# Patient Record
Sex: Female | Born: 1935
Health system: Southern US, Community
[De-identification: ages and names within clinical notes are randomized; demographics above are authoritative.]

## PROBLEM LIST (undated history)

## (undated) DIAGNOSIS — R55 Syncope and collapse: Secondary | ICD-10-CM

## (undated) DIAGNOSIS — T7840XA Allergy, unspecified, initial encounter: Secondary | ICD-10-CM

## (undated) DIAGNOSIS — Z8673 Personal history of transient ischemic attack (TIA), and cerebral infarction without residual deficits: Secondary | ICD-10-CM

## (undated) DIAGNOSIS — C50919 Malignant neoplasm of unspecified site of unspecified female breast: Secondary | ICD-10-CM

## (undated) DIAGNOSIS — I10 Essential (primary) hypertension: Secondary | ICD-10-CM

## (undated) DIAGNOSIS — E0590 Thyrotoxicosis, unspecified without thyrotoxic crisis or storm: Secondary | ICD-10-CM

## (undated) DIAGNOSIS — D649 Anemia, unspecified: Secondary | ICD-10-CM

## (undated) DIAGNOSIS — F419 Anxiety disorder, unspecified: Secondary | ICD-10-CM

## (undated) DIAGNOSIS — E039 Hypothyroidism, unspecified: Secondary | ICD-10-CM

## (undated) HISTORY — DX: Anxiety disorder, unspecified: F41.9

## (undated) HISTORY — DX: Malignant neoplasm of unspecified site of unspecified female breast: C50.919

## (undated) HISTORY — DX: Syncope and collapse: R55

## (undated) HISTORY — DX: Thyrotoxicosis, unspecified without thyrotoxic crisis or storm: E05.90

## (undated) HISTORY — DX: Essential (primary) hypertension: I10

## (undated) HISTORY — DX: Anemia, unspecified: D64.9

## (undated) HISTORY — DX: Hypothyroidism, unspecified: E03.9

## (undated) HISTORY — PX: COLON SURGERY: SHX602

## (undated) HISTORY — DX: Allergy, unspecified, initial encounter: T78.40XA

## (undated) HISTORY — PX: TONSILLECTOMY: SUR1361

## (undated) HISTORY — PX: CHOLECYSTECTOMY: SHX55

## (undated) HISTORY — DX: Personal history of transient ischemic attack (TIA), and cerebral infarction without residual deficits: Z86.73

---

## 1996-09-01 HISTORY — PX: BREAST LUMPECTOMY: SHX2

## 1997-12-04 ENCOUNTER — Ambulatory Visit (HOSPITAL_COMMUNITY): Admission: RE | Admit: 1997-12-04 | Discharge: 1997-12-04 | Payer: Self-pay | Admitting: General Surgery

## 1998-06-12 ENCOUNTER — Ambulatory Visit (HOSPITAL_COMMUNITY): Admission: RE | Admit: 1998-06-12 | Discharge: 1998-06-12 | Payer: Self-pay | Admitting: General Surgery

## 1998-12-06 ENCOUNTER — Ambulatory Visit (HOSPITAL_COMMUNITY): Admission: RE | Admit: 1998-12-06 | Discharge: 1998-12-06 | Payer: Self-pay | Admitting: General Surgery

## 1998-12-06 ENCOUNTER — Encounter: Payer: Self-pay | Admitting: General Surgery

## 1999-04-17 ENCOUNTER — Other Ambulatory Visit: Admission: RE | Admit: 1999-04-17 | Discharge: 1999-04-17 | Payer: Self-pay | Admitting: Internal Medicine

## 1999-05-07 ENCOUNTER — Ambulatory Visit (HOSPITAL_COMMUNITY): Admission: RE | Admit: 1999-05-07 | Discharge: 1999-05-07 | Payer: Self-pay | Admitting: Obstetrics and Gynecology

## 1999-05-07 ENCOUNTER — Encounter (INDEPENDENT_AMBULATORY_CARE_PROVIDER_SITE_OTHER): Payer: Self-pay | Admitting: Specialist

## 1999-12-18 ENCOUNTER — Encounter: Admission: RE | Admit: 1999-12-18 | Discharge: 1999-12-18 | Payer: Self-pay | Admitting: General Surgery

## 1999-12-18 ENCOUNTER — Encounter: Payer: Self-pay | Admitting: General Surgery

## 2000-10-09 ENCOUNTER — Other Ambulatory Visit: Admission: RE | Admit: 2000-10-09 | Discharge: 2000-10-09 | Payer: Self-pay | Admitting: Obstetrics and Gynecology

## 2000-12-21 ENCOUNTER — Encounter: Admission: RE | Admit: 2000-12-21 | Discharge: 2000-12-21 | Payer: Self-pay | Admitting: General Surgery

## 2000-12-21 ENCOUNTER — Encounter: Payer: Self-pay | Admitting: General Surgery

## 2001-08-18 ENCOUNTER — Ambulatory Visit (HOSPITAL_COMMUNITY): Admission: RE | Admit: 2001-08-18 | Discharge: 2001-08-18 | Payer: Self-pay | Admitting: Gastroenterology

## 2001-12-22 ENCOUNTER — Encounter: Payer: Self-pay | Admitting: General Surgery

## 2001-12-22 ENCOUNTER — Encounter: Admission: RE | Admit: 2001-12-22 | Discharge: 2001-12-22 | Payer: Self-pay | Admitting: General Surgery

## 2002-05-31 ENCOUNTER — Encounter (HOSPITAL_COMMUNITY): Admission: RE | Admit: 2002-05-31 | Discharge: 2002-08-29 | Payer: Self-pay | Admitting: Endocrinology

## 2002-06-01 ENCOUNTER — Encounter: Payer: Self-pay | Admitting: Endocrinology

## 2002-06-02 ENCOUNTER — Encounter: Payer: Self-pay | Admitting: Endocrinology

## 2002-06-08 ENCOUNTER — Encounter: Payer: Self-pay | Admitting: Endocrinology

## 2003-01-02 ENCOUNTER — Encounter: Admission: RE | Admit: 2003-01-02 | Discharge: 2003-01-02 | Payer: Self-pay | Admitting: Cardiology

## 2003-01-02 ENCOUNTER — Encounter: Payer: Self-pay | Admitting: Cardiology

## 2003-05-18 ENCOUNTER — Other Ambulatory Visit: Admission: RE | Admit: 2003-05-18 | Discharge: 2003-05-18 | Payer: Self-pay | Admitting: Obstetrics and Gynecology

## 2004-02-28 ENCOUNTER — Encounter: Admission: RE | Admit: 2004-02-28 | Discharge: 2004-02-28 | Payer: Self-pay | Admitting: Cardiology

## 2005-01-31 ENCOUNTER — Ambulatory Visit (HOSPITAL_COMMUNITY): Admission: RE | Admit: 2005-01-31 | Discharge: 2005-01-31 | Payer: Self-pay | Admitting: Orthopedic Surgery

## 2005-04-28 ENCOUNTER — Encounter: Admission: RE | Admit: 2005-04-28 | Discharge: 2005-04-28 | Payer: Self-pay | Admitting: Cardiology

## 2005-10-15 HISTORY — PX: KNEE ARTHROSCOPY: SUR90

## 2005-12-31 ENCOUNTER — Encounter: Payer: Self-pay | Admitting: General Surgery

## 2006-05-06 ENCOUNTER — Encounter: Admission: RE | Admit: 2006-05-06 | Discharge: 2006-05-06 | Payer: Self-pay | Admitting: Cardiology

## 2007-05-28 ENCOUNTER — Encounter: Admission: RE | Admit: 2007-05-28 | Discharge: 2007-05-28 | Payer: Self-pay | Admitting: Cardiology

## 2008-05-31 ENCOUNTER — Encounter: Admission: RE | Admit: 2008-05-31 | Discharge: 2008-05-31 | Payer: Self-pay | Admitting: Cardiology

## 2009-06-29 ENCOUNTER — Encounter: Admission: RE | Admit: 2009-06-29 | Discharge: 2009-06-29 | Payer: Self-pay | Admitting: Cardiology

## 2010-06-18 ENCOUNTER — Ambulatory Visit: Payer: Self-pay | Admitting: Cardiology

## 2010-07-30 ENCOUNTER — Encounter: Admission: RE | Admit: 2010-07-30 | Discharge: 2010-07-30 | Payer: Self-pay | Admitting: Cardiology

## 2010-12-03 ENCOUNTER — Encounter: Payer: Self-pay | Admitting: Cardiology

## 2010-12-04 ENCOUNTER — Encounter: Payer: Self-pay | Admitting: Cardiology

## 2010-12-04 ENCOUNTER — Ambulatory Visit (INDEPENDENT_AMBULATORY_CARE_PROVIDER_SITE_OTHER): Payer: Medicare Other | Admitting: Cardiology

## 2010-12-04 DIAGNOSIS — I119 Hypertensive heart disease without heart failure: Secondary | ICD-10-CM

## 2010-12-04 DIAGNOSIS — F32A Depression, unspecified: Secondary | ICD-10-CM

## 2010-12-04 DIAGNOSIS — Z853 Personal history of malignant neoplasm of breast: Secondary | ICD-10-CM

## 2010-12-04 DIAGNOSIS — E7800 Pure hypercholesterolemia, unspecified: Secondary | ICD-10-CM

## 2010-12-04 DIAGNOSIS — E039 Hypothyroidism, unspecified: Secondary | ICD-10-CM

## 2010-12-04 DIAGNOSIS — F329 Major depressive disorder, single episode, unspecified: Secondary | ICD-10-CM | POA: Insufficient documentation

## 2010-12-04 HISTORY — DX: Depression, unspecified: F32.A

## 2010-12-04 HISTORY — DX: Hypertensive heart disease without heart failure: I11.9

## 2010-12-04 NOTE — Progress Notes (Signed)
HPI: This pleasant 77 year old woman is seen for a scheduled six-month followup office visit she has a history of essential hypertension.  She's also had a past history of breast cancer and a history of hypothyroidism following treatment for hyperthyroidism.  She also has a history of hypercholesterolemia.  Since last visit she's been doing well with no new cardiac symptoms.  She's had no dizziness or syncope.  She's had no chest pain or shortness of breath or palpitations.  Current Outpatient Prescriptions  Medication Sig Dispense Refill  . aspirin 81 MG tablet Take 81 mg by mouth daily.        Marland Kitchen atenolol (TENORMIN) 50 MG tablet Take 50 mg by mouth daily.        Marland Kitchen atorvastatin (LIPITOR) 20 MG tablet Take 20 mg by mouth daily.        . Cholecalciferol (VITAMIN D PO) Take by mouth daily.        . Escitalopram Oxalate (LEXAPRO PO) Take by mouth as needed.        . Ferrous Sulfate (SLOW FE PO) Take by mouth daily.        . hydrochlorothiazide 25 MG tablet Take 12.5 mg by mouth daily.        Marland Kitchen levothyroxine (SYNTHROID, LEVOTHROID) 75 MCG tablet Take 88 mcg by mouth.         Allergies  Allergen Reactions  . Penicillins     Patient Active Problem List  Diagnoses  . Benign hypertensive heart disease without heart failure  . Hypothyroidism  . Hypercholesterolemia  . History of breast cancer  . Depression    History  Smoking status  . Never Smoker   Smokeless tobacco  . Not on file    History  Alcohol Use     No family history on file.  Review of Systems: The patient denies any heat or cold intolerance.  No weight gain or weight loss.  The patient denies headaches or blurry vision.  There is no cough or sputum production.  The patient denies dizziness.  There is no hematuria or hematochezia.  The patient denies any muscle aches or arthritis.  The patient denies any rash.  The patient denies frequent falling or instability.  There is no history of depression or anxiety.  All other  systems were reviewed and are negative.   Physical Exam: Filed Vitals:   12/04/10 1041  BP: 130/78  Pulse: 68   Weight is 151, down 10 pounds.Pupils equal and reactive.   Extraocular Movements are full.  There is no scleral icterus.  The mouth and pharynx are normal.  The neck is supple.  The carotids reveal no bruits.  The jugular venous pressure is normal.  The thyroid is not enlarged.  There is no lymphadenopathy.The chest is clear to percussion and auscultation. There are no rales or rhonchi. Expansion of the chest is symmetrical.  The breasts reveal no masses.The precordium is quiet.  The first heart sound is normal.  The second heart sound is physiologically split.  There is no murmur gallop rub or click.  There is no abnormal lift or heave.The abdomen is soft and nontender. Bowel sounds are normal. The liver and spleen are not enlarged. There Are no abdominal masses. There are no bruits.The pedal pulses are good.  There is no phlebitis or edema.  There is no cyanosis or clubbing.Strength is normal and symmetrical in all extremities.  There is no lateralizing weakness.  There are no sensory deficits.   Assessment /  Plan: Recheck in 6 months.  EKG and.  Continue same medication.

## 2010-12-04 NOTE — Assessment & Plan Note (Signed)
No awareness of any new lumps in her breast.  Her last mammogram was 07/30/10 and was normal at that time.  Her previous breast cancer was in 1998 in the left breast and Dr. Johna Sheriff is her surgeon

## 2010-12-04 NOTE — Assessment & Plan Note (Signed)
The patient has Lexapro on he had which she uses on a p.r.n.basis.Marland Kitchen

## 2010-12-04 NOTE — Assessment & Plan Note (Signed)
The patient has not been experiencing any symptoms referable to her blood pressure.  She remains on hydrochlorothiazide and atenolol.  She's not having any side effects from her medicines.  She denies chest pain or shortness of breath

## 2010-12-04 NOTE — Assessment & Plan Note (Signed)
Her lipids are followed closely by Dr. Leslie Dales and have remained stable

## 2010-12-20 ENCOUNTER — Other Ambulatory Visit: Payer: Self-pay | Admitting: Cardiology

## 2010-12-20 DIAGNOSIS — E7800 Pure hypercholesterolemia, unspecified: Secondary | ICD-10-CM

## 2010-12-20 NOTE — Telephone Encounter (Signed)
escribe request  

## 2011-01-17 NOTE — Op Note (Signed)
[facility]  Patient:    Vanessa Clayton, Vanessa Clayton Visit Number: [id number] MRN: [id number]          Service Type: END Location: ENDO Attending Physician:  Rich Brave Dictated by:   Florencia Reasons, M.D. Proc. Date: 08/18/01 Admit Date:  08/18/2001   CC:         Sharlet Salina T. Hoxworth, M.D.  Thomas A. Patty Sermons, M.D.  Esmeralda Arthur, M.D.   Operative Report  PROCEDURE:  Colonoscopy.  INDICATION:  Right lower quadrant discomfort and chronic constipation, both of which have gotten better on Citrucel supplementation. Also, need for colon cancer screening.  FINDINGS:  Minimal sigmoid diverticulosis.  DESCRIPTION OF PROCEDURE:  The nature, purpose, and risk of the procedure have been discussed with the patient who provided written consent.  SEDATION:  Fentanyl 100 mcg and Versed 10 mg IV without arrhythmias or desaturation.  The procedure was initiated with the Olympus adjustable tension Pediatric videocolonoscope but this was too flexible and basically did not reach the cecum despite full insertion, due to a lot of looping which could not be overcome even with the patient in the supine position and external abdominal compression, so we switched to the Olympus Adult videocolonoscope which was advanced quite easily the cecum with the help of some external abdominal compression with the patient is the left lateral decubitus position, and taking out loops during insertion.  The terminal ileum was entered for a short distance and appeared normal. The cecum appeared normal. Extraction through the colon benefited from an excellent prep so it was felt that all areas were well seen.  There was some minimal sigmoid diverticulosis, but otherwise no abnormalities, apart from internal hemorrhoids observed during retroflex viewing of the rectum.  No polyps, cancer, colitis, or vascular malformations were seen. No biopsies were obtained. The patient  tolerated the procedure well and there were no apparent complications.  IMPRESSION:  Minimal diverticulosis. No source of right lower quadrant abdominal pain evident. Otherwise normal exam.  PLAN:  Consider screening sigmoidoscopic evaluation in five years. Dictated by:   Florencia Reasons, M.D. Attending Physician:  Rich Brave DD:  08/18/01 TD:  08/18/01 Job: [id number]

## 2011-06-23 ENCOUNTER — Other Ambulatory Visit: Payer: Self-pay | Admitting: Cardiology

## 2011-07-04 ENCOUNTER — Ambulatory Visit (INDEPENDENT_AMBULATORY_CARE_PROVIDER_SITE_OTHER): Payer: Medicare Other | Admitting: Cardiology

## 2011-07-04 ENCOUNTER — Encounter: Payer: Self-pay | Admitting: Cardiology

## 2011-07-04 VITALS — BP 120/78 | HR 80 | Ht 67.0 in | Wt 154.0 lb

## 2011-07-04 DIAGNOSIS — E7800 Pure hypercholesterolemia, unspecified: Secondary | ICD-10-CM

## 2011-07-04 DIAGNOSIS — I119 Hypertensive heart disease without heart failure: Secondary | ICD-10-CM

## 2011-07-04 NOTE — Patient Instructions (Signed)
Your physician wants you to follow-up in: 6 month You will receive a reminder letter in the mail two months in advance. If you don't receive a letter, please call our office to schedule the follow-up appointment.   Your physician recommends that you continue on your current medications as directed. Please refer to the Current Medication list given to you today.  

## 2011-07-04 NOTE — Progress Notes (Signed)
Leandro Reasoner Date of Birth:  May 04, 1926 [facility] [address]   [address] [place], [place]  [address] [phone]           Fax   [phone]  HPI: This pleasant 77 year old, African American woman is seen for a scheduled six-month followup office visit.  Has a past history of essential hypertension, and a past history of hypercholesterolemia.  She also has a remote history of hyperthyroidism and is now hypothyroid and is on thyroid replacement therapy.  She has a past history of cancer of the left breast in 1998.  No evidence of recurrence.  Dr. Johna Sheriff is her surgeon.  Current Outpatient Prescriptions  Medication Sig Dispense Refill  . aspirin 81 MG tablet Take 81 mg by mouth daily.        Marland Kitchen atenolol (TENORMIN) 50 MG tablet Take 50 mg by mouth daily.        Marland Kitchen atorvastatin (LIPITOR) 20 MG tablet TAKE 1 TABLET EVERY DAY  90 tablet  2  . Escitalopram Oxalate (LEXAPRO PO) Take by mouth as needed.        . Ferrous Sulfate (SLOW FE PO) Take by mouth daily.        . hydrochlorothiazide (HYDRODIURIL) 25 MG tablet TAKE 1/2 TAB EVERY DAY FOR BLOOD PRESSURE  50 tablet  PRN  . levothyroxine (SYNTHROID, LEVOTHROID) 75 MCG tablet Take 88 mcg by mouth.       . meloxicam (MOBIC) 7.5 MG tablet Take 7.5 mg by mouth daily.        . Vitamin D, Ergocalciferol, (DRISDOL) 50000 UNITS CAPS 50,000 Units by Per NG tube route Daily.        Allergies  Allergen Reactions  . Penicillins     Patient Active Problem List  Diagnoses  . Benign hypertensive heart disease without heart failure  . Hypothyroidism  . Hypercholesterolemia  . History of breast cancer  . Depression    History  Smoking status  . Never Smoker   Smokeless tobacco  . Not on file    History  Alcohol Use     No family history on file.  Review of Systems: The patient denies any heat or cold intolerance.  No weight gain or weight loss.  The patient denies headaches or blurry vision.  There is no  cough or sputum production.  The patient denies dizziness.  There is no hematuria or hematochezia.  The patient denies any muscle aches or arthritis.  The patient denies any rash.  The patient denies frequent falling or instability.  There is no history of depression or anxiety.  All other systems were reviewed and are negative.   Physical Exam: Filed Vitals:   07/04/11 1414  BP: 120/78  Pulse: 80   General appearance reveals a well-developed, well-nourished woman in no distress.The head and neck exam reveals pupils equal and reactive.  Extraocular movements are full.  There is no scleral icterus.  The mouth and pharynx are normal.  The neck is supple.  The carotids reveal no bruits.  The jugular venous pressure is normal.  The  thyroid is not enlarged.  There is no lymphadenopathy.  The chest is clear to percussion and auscultation.  There are no rales or rhonchi.  Expansion of the chest is symmetrical.  The precordium is quiet.  The first heart sound is normal.  The second heart sound is physiologically split.  There is no murmur gallop rub or click.  There is no  abnormal lift or heave.  The abdomen is soft and nontender.  The bowel sounds are normal.  The liver and spleen are not enlarged.  There are no abdominal masses.  There are no abdominal bruits.  Extremities reveal good pedal pulses.  There is no phlebitis or edema.  There is no cyanosis or clubbing.  Strength is normal and symmetrical in all extremities.  There is no lateralizing weakness.  There are no sensory deficits.  The skin is warm and dry.  There is no rash.  EKG today shows normal sinus rhythm and is within normal limits.   Assessment / Plan:  Continue same medication.  Recheck for a followup office visit in 6 months.  Work on careful diet and try to lose weight.

## 2011-07-04 NOTE — Assessment & Plan Note (Signed)
The patient has been feeling well.  She's not having any problem with her blood pressure at this time.  She is tolerating her medicines well.  She denies any headaches or dizziness or palpitations.  No chest pain or shortness of breath.

## 2011-07-04 NOTE — Assessment & Plan Note (Signed)
Patient has a history of hypercholesterolemia.  She is on atorvastatin 20 mg daily.  She is not having any side effects from the atorvastatin.  Her cholesterol is monitored by her endocrinologist, along with her thyroid function studies.

## 2011-07-23 ENCOUNTER — Other Ambulatory Visit: Payer: Self-pay | Admitting: Cardiology

## 2011-07-23 DIAGNOSIS — Z1231 Encounter for screening mammogram for malignant neoplasm of breast: Secondary | ICD-10-CM

## 2011-08-07 ENCOUNTER — Other Ambulatory Visit: Payer: Self-pay | Admitting: Cardiology

## 2011-08-07 NOTE — Telephone Encounter (Signed)
Refilled atenolol

## 2011-08-20 ENCOUNTER — Ambulatory Visit
Admission: RE | Admit: 2011-08-20 | Discharge: 2011-08-20 | Disposition: A | Discharge status: Home / Self Care | Payer: Medicare Other | Source: Ambulatory Visit | Attending: Cardiology | Admitting: Cardiology

## 2011-08-20 DIAGNOSIS — Z1231 Encounter for screening mammogram for malignant neoplasm of breast: Secondary | ICD-10-CM

## 2012-01-06 ENCOUNTER — Ambulatory Visit (INDEPENDENT_AMBULATORY_CARE_PROVIDER_SITE_OTHER): Payer: Medicare Other | Admitting: Cardiology

## 2012-01-06 ENCOUNTER — Encounter: Payer: Self-pay | Admitting: Cardiology

## 2012-01-06 VITALS — BP 139/83 | HR 65 | Ht 65.0 in | Wt 159.0 lb

## 2012-01-06 DIAGNOSIS — E7800 Pure hypercholesterolemia, unspecified: Secondary | ICD-10-CM

## 2012-01-06 DIAGNOSIS — I119 Hypertensive heart disease without heart failure: Secondary | ICD-10-CM

## 2012-01-06 DIAGNOSIS — E039 Hypothyroidism, unspecified: Secondary | ICD-10-CM

## 2012-01-06 NOTE — Assessment & Plan Note (Signed)
Patient is clinically euthyroid 

## 2012-01-06 NOTE — Assessment & Plan Note (Signed)
The patient has not been experiencing any chest pain or shortness of breath.  She is not having any palpitations.  Exercise tolerance is good

## 2012-01-06 NOTE — Progress Notes (Signed)
Leandro Reasoner Date of Birth:  [birth year] [facility] [address] [place], [place]  [address] [phone]  Fax   [phone]  HPI: This pleasant 78 year old woman is seen for a scheduled 6 month followup office visit.  She has a past history of essential hypertension and history of hypercholesterolemia.  She has a remote history of thyrotoxicosis.  She's also had a past history of breast cancer of the left breast.  Since last visit she has been feeling well  Current Outpatient Prescriptions  Medication Sig Dispense Refill  . aspirin 81 MG tablet Take 81 mg by mouth daily.        Marland Kitchen atenolol (TENORMIN) 50 MG tablet TAKE 1 TABLET BY MOUTH EVERY DAY  90 tablet  PRN  . atorvastatin (LIPITOR) 20 MG tablet TAKE 1 TABLET EVERY DAY  90 tablet  2  . Escitalopram Oxalate (LEXAPRO PO) Take by mouth as needed.        . Ferrous Sulfate (SLOW FE PO) Take by mouth daily.        Marland Kitchen gabapentin (NEURONTIN) 300 MG capsule Take 300 mg by mouth as directed.       . hydrochlorothiazide (HYDRODIURIL) 25 MG tablet TAKE 1/2 TAB EVERY DAY FOR BLOOD PRESSURE  50 tablet  PRN  . levothyroxine (SYNTHROID, LEVOTHROID) 75 MCG tablet Take 88 mcg by mouth.       . meloxicam (MOBIC) 7.5 MG tablet Take 7.5 mg by mouth daily.        . Vitamin D, Ergocalciferol, (DRISDOL) 50000 UNITS CAPS 50,000 Units by Per NG tube route Daily.        Allergies  Allergen Reactions  . Penicillins     Patient Active Problem List  Diagnoses  . Benign hypertensive heart disease without heart failure  . Hypothyroidism  . Hypercholesterolemia  . History of breast cancer  . Depression    History  Smoking status  . Never Smoker   Smokeless tobacco  . Not on file    History  Alcohol Use     History reviewed. No pertinent family history.  Review of Systems: The patient denies any heat or cold intolerance.  No weight gain or weight loss.  The patient denies headaches or blurry vision.  There is no cough  or sputum production.  The patient denies dizziness.  There is no hematuria or hematochezia.  The patient denies any muscle aches or arthritis.  The patient denies any rash.  The patient denies frequent falling or instability.  There is no history of depression or anxiety.  All other systems were reviewed and are negative.   Physical Exam: Filed Vitals:   01/06/12 1024  BP: 139/83  Pulse: 65   the general appearance reveals a well-developed well-nourished African American woman in no distress.The head and neck exam reveals pupils equal and reactive.  Extraocular movements are full.  There is no scleral icterus.  The mouth and pharynx are normal.  The neck is supple.  The carotids reveal no bruits.  The jugular venous pressure is normal.  The  thyroid is not enlarged.  There is no lymphadenopathy.  The chest is clear to percussion and auscultation.  There are no rales or rhonchi.  Expansion of the chest is symmetrical.  The precordium is quiet.  The first heart sound is normal.  The second heart sound is physiologically split.  There is no murmur gallop rub or click.  There is no abnormal lift or heave.  The  abdomen is soft and nontender.  The bowel sounds are normal.  The liver and spleen are not enlarged.  There are no abdominal masses.  There are no abdominal bruits.  Extremities reveal good pedal pulses.  There is no phlebitis or edema.  There is no cyanosis or clubbing.  Strength is normal and symmetrical in all extremities.  There is no lateralizing weakness.  There are no sensory deficits.  The skin is warm and dry.  There is no rash.     Assessment / Plan: Continue same medication.  Recheck in 6 months for followup office visit and EKG.

## 2012-01-06 NOTE — Assessment & Plan Note (Signed)
The patient has a history of hypercholesterolemia.  She is on low-dose Lipitor.  Her lipids are followed by Dr. Leslie Dales.

## 2012-01-06 NOTE — Patient Instructions (Signed)
Your physician recommends that you continue on your current medications as directed. Please refer to the Current Medication list given to you today.  Your physician wants you to follow-up in: 6 months. You will receive a reminder letter in the mail two months in advance. If you don't receive a letter, please call our office to schedule the follow-up appointment.  

## 2012-01-14 ENCOUNTER — Other Ambulatory Visit (HOSPITAL_COMMUNITY): Payer: Medicare Other

## 2012-02-05 ENCOUNTER — Telehealth: Payer: Self-pay | Admitting: *Deleted

## 2012-02-05 NOTE — Telephone Encounter (Signed)
Echo scheduled in error

## 2012-02-05 NOTE — Telephone Encounter (Signed)
Message copied by Burnell Blanks on Thu Feb 05, 2012 11:34 AM ------      Message from: Connye Burkitt      Created: Tue Jan 06, 2012 11:29 AM      Regarding: echo       Echo 01/14/12 @ 1pm      Medicare/bcbs

## 2012-02-06 ENCOUNTER — Ambulatory Visit: Payer: Medicare Other | Admitting: Cardiology

## 2012-07-22 ENCOUNTER — Other Ambulatory Visit: Payer: Self-pay | Admitting: Cardiology

## 2012-07-23 ENCOUNTER — Other Ambulatory Visit: Payer: Self-pay

## 2012-07-23 MED ORDER — HYDROCHLOROTHIAZIDE 25 MG PO TABS: ORAL_TABLET | ORAL | Status: DC

## 2012-09-20 ENCOUNTER — Other Ambulatory Visit: Payer: Self-pay

## 2012-09-20 DIAGNOSIS — E7800 Pure hypercholesterolemia, unspecified: Secondary | ICD-10-CM

## 2012-09-20 MED ORDER — ATORVASTATIN CALCIUM 20 MG PO TABS: 20.0000 mg | ORAL_TABLET | Freq: Every day | ORAL | Status: DC

## 2012-09-21 ENCOUNTER — Other Ambulatory Visit: Payer: Self-pay | Admitting: Cardiology

## 2012-09-21 DIAGNOSIS — Z1231 Encounter for screening mammogram for malignant neoplasm of breast: Secondary | ICD-10-CM

## 2012-10-27 ENCOUNTER — Ambulatory Visit
Admission: RE | Admit: 2012-10-27 | Discharge: 2012-10-27 | Disposition: A | Discharge status: Home / Self Care | Payer: Medicare PPO | Source: Ambulatory Visit | Attending: Cardiology | Admitting: Cardiology

## 2012-10-27 DIAGNOSIS — Z1231 Encounter for screening mammogram for malignant neoplasm of breast: Secondary | ICD-10-CM

## 2012-11-03 ENCOUNTER — Other Ambulatory Visit: Payer: Self-pay | Admitting: *Deleted

## 2012-11-03 MED ORDER — ATENOLOL 50 MG PO TABS: 50.0000 mg | ORAL_TABLET | Freq: Every day | ORAL | Status: DC

## 2012-12-09 ENCOUNTER — Ambulatory Visit (INDEPENDENT_AMBULATORY_CARE_PROVIDER_SITE_OTHER): Payer: Medicare PPO | Admitting: Cardiology

## 2012-12-09 ENCOUNTER — Encounter: Payer: Self-pay | Admitting: Cardiology

## 2012-12-09 VITALS — BP 116/72 | HR 83 | Ht 67.0 in | Wt 145.0 lb

## 2012-12-09 DIAGNOSIS — R634 Abnormal weight loss: Secondary | ICD-10-CM | POA: Insufficient documentation

## 2012-12-09 DIAGNOSIS — I493 Ventricular premature depolarization: Secondary | ICD-10-CM

## 2012-12-09 DIAGNOSIS — E039 Hypothyroidism, unspecified: Secondary | ICD-10-CM

## 2012-12-09 DIAGNOSIS — I4949 Other premature depolarization: Secondary | ICD-10-CM

## 2012-12-09 HISTORY — DX: Abnormal weight loss: R63.4

## 2012-12-09 HISTORY — DX: Ventricular premature depolarization: I49.3

## 2012-12-09 NOTE — Assessment & Plan Note (Signed)
EKG today shows asymptomatic PVCs.  She does not consume much caffeine.  She attributes the PVCs to stress.  She herself is not aware of any palpitations or racing of her heart.  She has not been having any chest pain or shortness of breath.

## 2012-12-09 NOTE — Progress Notes (Signed)
Vanessa Clayton Date of Birth:  1925-10-01 [facility] [address] [place], [place]  [address] [phone]  Fax   [phone]  HPI: This pleasant 79 year old woman is seen for a scheduled 6 month followup office visit. She has a past history of essential hypertension and history of hypercholesterolemia. She has a remote history of thyrotoxicosis. She's also had a past history of breast cancer of the left breast. Since last visit she has been feeling well.   Current Outpatient Prescriptions  Medication Sig Dispense Refill  . aspirin 81 MG tablet Take 81 mg by mouth daily.        Marland Kitchen atenolol (TENORMIN) 50 MG tablet Take 1 tablet (50 mg total) by mouth daily.  90 tablet  PRN  . atorvastatin (LIPITOR) 20 MG tablet Take 1 tablet (20 mg total) by mouth daily.  90 tablet  2  . Escitalopram Oxalate (LEXAPRO PO) Take by mouth as needed.        . Ferrous Sulfate (SLOW FE PO) Take by mouth daily.        . hydrochlorothiazide (HYDRODIURIL) 25 MG tablet TAKE 1/2 TABLET EVERY DAY FOR BLOOD PRESSURE.  50 tablet  PRN  . levothyroxine (SYNTHROID, LEVOTHROID) 75 MCG tablet Take 88 mcg by mouth.       . Vitamin D, Ergocalciferol, (DRISDOL) 50000 UNITS CAPS 50,000 Units by Per NG tube route Daily.       No current facility-administered medications for this visit.    Allergies  Allergen Reactions  . Penicillins     Patient Active Problem List  Diagnosis  . Benign hypertensive heart disease without heart failure  . Hypothyroidism  . Hypercholesterolemia  . History of breast cancer  . Depression  . Asymptomatic PVCs  . Weight loss, non-intentional    History  Smoking status  . Never Smoker   Smokeless tobacco  . Not on file    History  Alcohol Use     No family history on file.  Review of Systems: The patient denies any heat or cold intolerance.  No weight gain or weight loss.  The patient denies headaches or blurry vision.  There is no cough or sputum  production.  The patient denies dizziness.  There is no hematuria or hematochezia.  The patient denies any muscle aches or arthritis.  The patient denies any rash.  The patient denies frequent falling or instability.  There is no history of depression or anxiety.  All other systems were reviewed and are negative.   Physical Exam: Filed Vitals:   12/09/12 0931  BP: 116/72  Pulse: 83   the general appearance reveals a well-developed well-nourished woman in no distress.The head and neck exam reveals pupils equal and reactive.  Extraocular movements are full.  There is no scleral icterus.  The mouth and pharynx are normal.  The neck is supple.  The carotids reveal no bruits.  The jugular venous pressure is normal.  The  thyroid is not enlarged.  There is no lymphadenopathy.  The chest is clear to percussion and auscultation.  There are no rales or rhonchi.  Expansion of the chest is symmetrical.  The precordium is quiet.  The first heart sound is normal.  The second heart sound is physiologically split.  There is no murmur gallop rub or click.  There is no abnormal lift or heave.  The abdomen is soft and nontender.  The bowel sounds are normal.  The liver and spleen are not enlarged.  There are no abdominal masses.  There are no abdominal bruits.  Extremities reveal good pedal pulses.  There is no phlebitis or edema.  There is no cyanosis or clubbing.  Strength is normal and symmetrical in all extremities.  There is no lateralizing weakness.  There are no sensory deficits.  The skin is warm and dry.  There is no rash.  EKG shows normal sinus rhythm with frequent unifocal PVCs.    Assessment / Plan: Continue same medication.  Recheck in 6 months for followup office visit.  The PVCs are asymptomatic and do not require any specific therapy

## 2012-12-09 NOTE — Patient Instructions (Addendum)
Your physician recommends that you continue on your current medications as directed. Please refer to the Current Medication list given to you today.  Your physician wants you to follow-up in: 6 MONTH OV  You will receive a reminder letter in the mail two months in advance. If you don't receive a letter, please call our office to schedule the follow-up appointment.  

## 2012-12-09 NOTE — Assessment & Plan Note (Signed)
The patient has a past history of hypothyroidism and is now hypothyroid and clinically is euthyroid on thyroid replacement therapy.  This is followed closely by Dr. Leslie Dales.

## 2012-12-09 NOTE — Assessment & Plan Note (Signed)
The patient has had a 14 pound weight loss which is not intentional.  She attributes this to stress at home.  Her husband's health is failing.  He is 77-year-old or then she is.  He is having problem with being hard of hearing and loss of eyesight in his memory is fading.  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

## 2013-06-17 ENCOUNTER — Encounter: Payer: Self-pay | Admitting: Cardiology

## 2013-06-17 ENCOUNTER — Other Ambulatory Visit: Payer: Self-pay | Admitting: Cardiology

## 2013-06-17 ENCOUNTER — Ambulatory Visit (INDEPENDENT_AMBULATORY_CARE_PROVIDER_SITE_OTHER): Payer: Medicare PPO | Admitting: Cardiology

## 2013-06-17 VITALS — BP 124/78 | HR 73 | Ht 67.0 in | Wt 141.0 lb

## 2013-06-17 DIAGNOSIS — R634 Abnormal weight loss: Secondary | ICD-10-CM

## 2013-06-17 DIAGNOSIS — I119 Hypertensive heart disease without heart failure: Secondary | ICD-10-CM

## 2013-06-17 DIAGNOSIS — R0600 Dyspnea, unspecified: Secondary | ICD-10-CM

## 2013-06-17 DIAGNOSIS — R0609 Other forms of dyspnea: Secondary | ICD-10-CM

## 2013-06-17 DIAGNOSIS — E7800 Pure hypercholesterolemia, unspecified: Secondary | ICD-10-CM

## 2013-06-17 HISTORY — DX: Dyspnea, unspecified: R06.00

## 2013-06-17 HISTORY — DX: Other forms of dyspnea: R06.09

## 2013-06-17 NOTE — Assessment & Plan Note (Signed)
The patient has been experiencing some increasing dyspnea on exertion.  We will update her chest x-ray and also obtain a two-dimensional echocardiogram.

## 2013-06-17 NOTE — Progress Notes (Signed)
Leandro Reasoner Date of Birth:  07-16-1926 [address] [place], [place]  [address] [phone]  Fax   [phone]  HPI: This pleasant 79 year old woman is seen for a scheduled 6 month followup office visit. She has a past history of essential hypertension and history of hypercholesterolemia. She has a remote history of thyrotoxicosis. She's also had a past history of breast cancer of the left breast. Since last visit she has been feeling well.  She has been experiencing more dyspnea when climbing steps.  She has been more short of breath for the past 3 months.  She has continued to lose weight without trying.  Her weight is down 4 pounds.  Her last EKG was 12/09/12 and was unremarkable.  She has not had a recent chest x-ray.   Current Outpatient Prescriptions  Medication Sig Dispense Refill  . aspirin 81 MG tablet Take 81 mg by mouth daily.        Marland Kitchen atenolol (TENORMIN) 50 MG tablet Take 1 tablet (50 mg total) by mouth daily.  90 tablet  PRN  . Escitalopram Oxalate (LEXAPRO PO) Take by mouth as needed.        . Ferrous Sulfate (SLOW FE PO) Take by mouth daily.        . hydrochlorothiazide (HYDRODIURIL) 25 MG tablet TAKE 1/2 TABLET EVERY DAY FOR BLOOD PRESSURE.  50 tablet  PRN  . levothyroxine (SYNTHROID, LEVOTHROID) 75 MCG tablet Take 88 mcg by mouth.       . Vitamin D, Ergocalciferol, (DRISDOL) 50000 UNITS CAPS 50,000 Units by Per NG tube route Daily.      Marland Kitchen atorvastatin (LIPITOR) 20 MG tablet TAKE 1 TABLET (20 MG TOTAL) BY MOUTH DAILY.  90 tablet  0   No current facility-administered medications for this visit.    Allergies  Allergen Reactions  . Penicillins     Patient Active Problem List   Diagnosis Date Noted  . Benign hypertensive heart disease without heart failure 12/04/2010    Priority: Medium  . Hypercholesterolemia 12/04/2010    Priority: Medium  . History of breast cancer 12/04/2010    Priority: Medium  . Dyspnea on exertion 06/17/2013  .  Asymptomatic PVCs 12/09/2012  . Weight loss, non-intentional 12/09/2012  . Hypothyroidism 12/04/2010  . Depression 12/04/2010    History  Smoking status  . Never Smoker   Smokeless tobacco  . Not on file    History  Alcohol Use: Not on file    Family History  Problem Relation Age of Onset  . Hypertension Mother     Review of Systems: The patient denies any heat or cold intolerance.  No weight gain or weight loss.  The patient denies headaches or blurry vision.  There is no cough or sputum production.  The patient denies dizziness.  There is no hematuria or hematochezia.  The patient denies any muscle aches or arthritis.  The patient denies any rash.  The patient denies frequent falling or instability.  There is no history of depression or anxiety.  All other systems were reviewed and are negative.   Physical Exam: Filed Vitals:   06/17/13 1128  BP: 124/78  Pulse: 73   the general appearance reveals a well-developed well-nourished woman in no distress.The head and neck exam reveals pupils equal and reactive.  Extraocular movements are full.  There is no scleral icterus.  The mouth and pharynx are normal.  The neck is supple.  The carotids reveal no bruits.  The jugular venous pressure is normal.  The  thyroid is not enlarged.  There is no lymphadenopathy.  The chest is clear to percussion and auscultation.  There are no rales or rhonchi.  Expansion of the chest is symmetrical.  The precordium is quiet.  The first heart sound is normal.  The second heart sound is physiologically split.  There is no murmur gallop rub or click.  There is no abnormal lift or heave.  The abdomen is soft and nontender.  The bowel sounds are normal.  The liver and spleen are not enlarged.  There are no abdominal masses.  There are no abdominal bruits.  Extremities reveal good pedal pulses.  There is no phlebitis or edema.  There is no cyanosis or clubbing.  Strength is normal and symmetrical in all  extremities.  There is no lateralizing weakness.  There are no sensory deficits.  The skin is warm and dry.  There is no rash.      Assessment / Plan: Continue same medication.  Recheck in 6 months for followup office visit.  Evaluate her symptoms of exertional dyspnea with chest x-ray and two-dimensional echocardiogram.  Dr. Leslie Dales will be continuing to check her labs.  If she has not had a recent CBC this may also need to be checked.

## 2013-06-17 NOTE — Patient Instructions (Signed)
Your physician has requested that you have an echocardiogram. Echocardiography is a painless test that uses sound waves to create images of your heart. It provides your doctor with information about the size and shape of your heart and how well your heart's chambers and valves are working. This procedure takes approximately one hour. There are no restrictions for this procedure.  WOULD LIKE FOR YOU TO GO TO [place] [facility] FROM [place] SO[facility] FOR A CHEST XRAY  Your physician recommends that you continue on your current medications as directed. Please refer to the Current Medication list given to you today.  Your physician wants you to follow-up in: 6 MONTHS You will receive a reminder letter in the mail two months in advance. If you don't receive a letter, please call our office to schedule the follow-up appointment.

## 2013-06-17 NOTE — Assessment & Plan Note (Signed)
The patient has nonintentional weight loss.  Her blood work is done by Dr. Leslie Dales.  She is wondering if she still needs to be on Lipitor.  I will defer that question to her PCP since we have not checked any labs in our office lately.

## 2013-06-17 NOTE — Assessment & Plan Note (Signed)
Blood pressures remained stable on current therapy. 

## 2013-07-04 ENCOUNTER — Ambulatory Visit (HOSPITAL_COMMUNITY): Payer: Medicare PPO | Attending: Cardiology | Admitting: Cardiology

## 2013-07-04 DIAGNOSIS — R0609 Other forms of dyspnea: Secondary | ICD-10-CM | POA: Insufficient documentation

## 2013-07-04 DIAGNOSIS — R0989 Other specified symptoms and signs involving the circulatory and respiratory systems: Secondary | ICD-10-CM | POA: Insufficient documentation

## 2013-07-04 DIAGNOSIS — E785 Hyperlipidemia, unspecified: Secondary | ICD-10-CM | POA: Insufficient documentation

## 2013-07-04 DIAGNOSIS — I10 Essential (primary) hypertension: Secondary | ICD-10-CM | POA: Insufficient documentation

## 2013-07-04 DIAGNOSIS — I079 Rheumatic tricuspid valve disease, unspecified: Secondary | ICD-10-CM | POA: Insufficient documentation

## 2013-07-04 NOTE — Progress Notes (Signed)
Echo performed. 

## 2013-07-06 ENCOUNTER — Telehealth: Payer: Self-pay | Admitting: *Deleted

## 2013-07-06 NOTE — Telephone Encounter (Signed)
Message copied by Burnell Blanks on Wed Jul 06, 2013  6:03 PM ------      Message from: Cassell Clement      Created: Tue Jul 05, 2013  5:31 AM       Echo is okay. Good systolic function. Mild diastolic dysfunction may be the cause of her dyspnea. Continue same meds, careful diet. ------

## 2013-07-06 NOTE — Telephone Encounter (Signed)
Advised patient

## 2013-08-10 ENCOUNTER — Other Ambulatory Visit: Payer: Self-pay | Admitting: Cardiology

## 2013-12-05 ENCOUNTER — Other Ambulatory Visit: Payer: Self-pay | Admitting: Cardiology

## 2014-01-03 ENCOUNTER — Other Ambulatory Visit: Payer: Self-pay

## 2014-01-03 DIAGNOSIS — Z1231 Encounter for screening mammogram for malignant neoplasm of breast: Secondary | ICD-10-CM

## 2014-01-05 ENCOUNTER — Ambulatory Visit (INDEPENDENT_AMBULATORY_CARE_PROVIDER_SITE_OTHER): Payer: Medicare PPO | Admitting: Cardiology

## 2014-01-05 ENCOUNTER — Encounter: Payer: Self-pay | Admitting: Cardiology

## 2014-01-05 VITALS — BP 134/62 | HR 60 | Ht 67.0 in | Wt 134.0 lb

## 2014-01-05 DIAGNOSIS — R0989 Other specified symptoms and signs involving the circulatory and respiratory systems: Secondary | ICD-10-CM

## 2014-01-05 DIAGNOSIS — E7800 Pure hypercholesterolemia, unspecified: Secondary | ICD-10-CM

## 2014-01-05 DIAGNOSIS — R634 Abnormal weight loss: Secondary | ICD-10-CM

## 2014-01-05 DIAGNOSIS — R0609 Other forms of dyspnea: Secondary | ICD-10-CM

## 2014-01-05 DIAGNOSIS — I119 Hypertensive heart disease without heart failure: Secondary | ICD-10-CM

## 2014-01-05 NOTE — Assessment & Plan Note (Signed)
Blood pressure was remaining stable on current therapy 

## 2014-01-05 NOTE — Assessment & Plan Note (Signed)
Dyspnea on exertion is unchanged.  It is not worsened since last visit.  We will update her chest x-ray

## 2014-01-05 NOTE — Progress Notes (Signed)
Vanessa Clayton Date of Birth:  [birth year] [place] [address] [place], [place]  [address] [phone]        Fax   [phone]   History of Present Illness: This pleasant 80 year old woman is seen for a scheduled 6 month followup office visit. She has a past history of essential hypertension and history of hypercholesterolemia. She has a remote history of thyrotoxicosis. She's also had a past history of breast cancer of the left breast.  She has not had a recent chest x-ray.  We had requested 1 at her last office visit but apparently it did not get done.  She has continued to lose weight.  Over the past 6 months she has lost another 7 pounds.  Her appetite is only fair.  Her shortness of breath is unchanged.  She had an echocardiogram in 07/04/13 showing an ejection fraction of 55-60% with grade 1 diastolic dysfunction.  She has had some sensation that her muscles are weak and she has morning stiffness.   Current Outpatient Prescriptions  Medication Sig Dispense Refill  . aspirin 81 MG tablet Take 81 mg by mouth daily.        Marland Kitchen atenolol (TENORMIN) 50 MG tablet TAKE 1 TABLET BY MOUTH DAILY  90 tablet  0  . ENSURE (ENSURE) Take 237 mLs by mouth as directed. 1-2 cans a day      . Escitalopram Oxalate (LEXAPRO PO) Take by mouth as needed.        . Ferrous Sulfate (SLOW FE PO) Take by mouth daily.        . hydrochlorothiazide (HYDRODIURIL) 25 MG tablet TAKE 1/2 TABLET BY MOUTH ONCE DAILY  45 tablet  PRN  . levothyroxine (SYNTHROID, LEVOTHROID) 75 MCG tablet Take 88 mcg by mouth.       . Vitamin D, Ergocalciferol, (DRISDOL) 50000 UNITS CAPS 50,000 Units by Per NG tube route Daily.       No current facility-administered medications for this visit.    Allergies  Allergen Reactions  . Penicillins     Patient Active Problem List   Diagnosis Date Noted  . Benign hypertensive heart disease without heart failure 12/04/2010    Priority: Medium  .  Hypercholesterolemia 12/04/2010    Priority: Medium  . History of breast cancer 12/04/2010    Priority: Medium  . Dyspnea on exertion 06/17/2013  . Asymptomatic PVCs 12/09/2012  . Weight loss, non-intentional 12/09/2012  . Hypothyroidism 12/04/2010  . Depression 12/04/2010    History  Smoking status  . Never Smoker   Smokeless tobacco  . Not on file    History  Alcohol Use: Not on file    Family History  Problem Relation Age of Onset  . Hypertension Mother     Review of Systems: Constitutional: no fever chills diaphoresis or fatigue or change in weight.  Head and neck: no hearing loss, no epistaxis, no photophobia or visual disturbance. Respiratory: No cough, shortness of breath or wheezing. Cardiovascular: No chest pain peripheral edema, palpitations. Gastrointestinal: No abdominal distention, no abdominal pain, no change in bowel habits hematochezia or melena. Genitourinary: No dysuria, no frequency, no urgency, no nocturia. Musculoskeletal:No arthralgias, no back pain, no gait disturbance or myalgias. Neurological: No dizziness, no headaches, no numbness, no seizures, no syncope, no weakness, no tremors. Hematologic: No lymphadenopathy, no easy bruising. Psychiatric: No confusion, no hallucinations, no sleep disturbance.    Physical Exam: Filed Vitals:   01/05/14 1114  BP: 134/62  Pulse: 60   the general appearance reveals a thin elderly woman in no distress.The head and neck exam reveals pupils equal and reactive.  Extraocular movements are full.  There is no scleral icterus.  The mouth and pharynx are normal.  The neck is supple.  The carotids reveal no bruits.  The jugular venous pressure is normal.  The  thyroid is not enlarged.  There is no lymphadenopathy.  The chest is clear to percussion and auscultation.  There are no rales or rhonchi.  Expansion of the chest is symmetrical.  The precordium is quiet.  The first heart sound is normal.  The second heart sound  is physiologically split.  There is no murmur gallop rub or click.  There is no abnormal lift or heave.  The abdomen is soft and nontender.  The bowel sounds are normal.  The liver and spleen are not enlarged.  There are no abdominal masses.  There are no abdominal bruits.  Extremities reveal good pedal pulses.  There is no phlebitis or edema.  There is no cyanosis or clubbing.  Strength is normal and symmetrical in all extremities.  There is no lateralizing weakness.  There are no sensory deficits.  The skin is warm and dry.  There is no rash.     Assessment / Plan: 1.  Unintentional weight loss 2. history of hypercholesterolemia 3. diastolic dysfunction by echocardiogram 07/04/13 4. history of breast cancer 5. prior history of hyperthyroidism  Plan: We will try stopping her Lipitor at this point and see if it makes a difference in her ongoing weight loss. Update chest x-ray per Recheck in 6 months for office visit lipid panel hepatic function panel basal metabolic panel and CBC She will she will try drinking some cans of Ensure to see if this will help her weight loss also.

## 2014-01-05 NOTE — Assessment & Plan Note (Signed)
Her weight loss may be from loss of muscle mass possibly related to her Lipitor we will stop her Lipitor now.  When she returns in 6 months we will check fasting lipid panel hepatic function panel and basal metabolic panel and CBC.

## 2014-01-05 NOTE — Patient Instructions (Signed)
Stop Lipitor for now  ADD ENSURE 1-2 CANS A DAY  Your physician wants you to follow-up in: 6 months with fasting labs (lp/bmet/hfp/cbc)  You will receive a reminder letter in the mail two months in advance. If you don't receive a letter, please call our office to schedule the follow-up appointment.

## 2014-01-06 ENCOUNTER — Other Ambulatory Visit (INDEPENDENT_AMBULATORY_CARE_PROVIDER_SITE_OTHER): Payer: Medicare PPO

## 2014-01-06 ENCOUNTER — Ambulatory Visit (INDEPENDENT_AMBULATORY_CARE_PROVIDER_SITE_OTHER)
Admission: RE | Admit: 2014-01-06 | Discharge: 2014-01-06 | Disposition: A | Discharge status: Home / Self Care | Payer: Medicare PPO | Source: Ambulatory Visit | Attending: Cardiology | Admitting: Cardiology

## 2014-01-06 DIAGNOSIS — R0989 Other specified symptoms and signs involving the circulatory and respiratory systems: Secondary | ICD-10-CM

## 2014-01-06 DIAGNOSIS — E7800 Pure hypercholesterolemia, unspecified: Secondary | ICD-10-CM

## 2014-01-06 DIAGNOSIS — R0609 Other forms of dyspnea: Secondary | ICD-10-CM

## 2014-01-06 DIAGNOSIS — I119 Hypertensive heart disease without heart failure: Secondary | ICD-10-CM

## 2014-01-06 DIAGNOSIS — R634 Abnormal weight loss: Secondary | ICD-10-CM

## 2014-01-06 LAB — BASIC METABOLIC PANEL
BUN: 28 mg/dL — ABNORMAL HIGH (ref 6–23)
CALCIUM: 9.4 mg/dL (ref 8.4–10.5)
CO2: 26 mEq/L (ref 19–32)
Chloride: 106 mEq/L (ref 96–112)
Creatinine, Ser: 1.2 mg/dL (ref 0.4–1.2)
GFR: 55.91 mL/min — AB (ref >60.00)
Glucose, Bld: 77 mg/dL (ref 70–99)
POTASSIUM: 4.1 meq/L (ref 3.5–5.1)
Sodium: 139 mEq/L (ref 135–145)

## 2014-01-06 LAB — HEPATIC FUNCTION PANEL
ALBUMIN: 3.9 g/dL (ref 3.5–5.2)
ALT: 9 U/L (ref 0–35)
AST: 16 U/L (ref 0–37)
Alkaline Phosphatase: 54 U/L (ref 39–117)
Bilirubin, Direct: 0.1 mg/dL (ref 0.0–0.3)
Total Bilirubin: 0.8 mg/dL (ref 0.2–1.2)
Total Protein: 7 g/dL (ref 6.0–8.3)

## 2014-01-06 LAB — CBC WITH DIFFERENTIAL/PLATELET
Basophils Absolute: 0 10*3/uL (ref 0.0–0.1)
Basophils Relative: 0.6 % (ref 0.0–3.0)
EOS ABS: 0.1 10*3/uL (ref 0.0–0.7)
Eosinophils Relative: 1.8 % (ref 0.0–5.0)
HCT: 32.7 % — ABNORMAL LOW (ref 36.0–46.0)
Hemoglobin: 10.4 g/dL — ABNORMAL LOW (ref 12.0–15.0)
LYMPHS PCT: 46.8 % — AB (ref 12.0–46.0)
Lymphs Abs: 3 10*3/uL (ref 0.7–4.0)
MCHC: 32 g/dL (ref 30.0–36.0)
MCV: 77.8 fl — AB (ref 78.0–100.0)
Monocytes Absolute: 0.6 10*3/uL (ref 0.1–1.0)
Monocytes Relative: 8.5 % (ref 3.0–12.0)
NEUTROS PCT: 42.3 % — AB (ref 43.0–77.0)
Neutro Abs: 2.7 10*3/uL (ref 1.4–7.7)
PLATELETS: 162 10*3/uL (ref 150.0–400.0)
RBC: 4.2 Mil/uL (ref 3.87–5.11)
RDW: 14 % (ref 11.5–15.5)
WBC: 6.4 10*3/uL (ref 4.0–10.5)

## 2014-01-06 LAB — LIPID PANEL
Cholesterol: 142 mg/dL (ref 0–200)
HDL: 50.1 mg/dL (ref >39.00)
LDL Cholesterol: 80 mg/dL (ref 0–99)
TRIGLYCERIDES: 58 mg/dL (ref 0.0–149.0)
Total CHOL/HDL Ratio: 3
VLDL: 11.6 mg/dL (ref 0.0–40.0)

## 2014-01-06 NOTE — Progress Notes (Signed)
Quick Note:  Please report to patient. The recent labs are stable. Continue same medication and careful diet. Liver tests are normal. Kidneys are slightly dry. Increase water. There is mild anemia. Continue iron. The lipids are normal. ______

## 2014-01-19 ENCOUNTER — Ambulatory Visit
Admission: RE | Admit: 2014-01-19 | Discharge: 2014-01-19 | Disposition: A | Discharge status: Home / Self Care | Payer: Medicare PPO | Source: Ambulatory Visit

## 2014-01-19 DIAGNOSIS — Z1231 Encounter for screening mammogram for malignant neoplasm of breast: Secondary | ICD-10-CM

## 2014-03-06 ENCOUNTER — Other Ambulatory Visit: Payer: Self-pay | Admitting: Cardiology

## 2014-03-07 NOTE — Telephone Encounter (Signed)
atenolol (TENORMIN) 50 MG tablet  TAKE 1 TABLET BY MOUTH DAILY   90 tablet   Darlin Coco, MD at 01/05/2014  1:29 PM

## 2014-06-03 ENCOUNTER — Other Ambulatory Visit: Payer: Self-pay | Admitting: Cardiology

## 2014-09-12 ENCOUNTER — Other Ambulatory Visit: Payer: Self-pay | Admitting: Cardiology

## 2014-09-27 ENCOUNTER — Other Ambulatory Visit: Payer: Self-pay | Admitting: Cardiology

## 2014-10-24 ENCOUNTER — Encounter: Payer: Self-pay | Admitting: Cardiology

## 2014-10-24 ENCOUNTER — Ambulatory Visit (INDEPENDENT_AMBULATORY_CARE_PROVIDER_SITE_OTHER): Payer: Medicare PPO | Admitting: Cardiology

## 2014-10-24 VITALS — BP 146/82 | HR 77 | Ht 67.0 in | Wt 127.0 lb

## 2014-10-24 DIAGNOSIS — R0609 Other forms of dyspnea: Secondary | ICD-10-CM

## 2014-10-24 DIAGNOSIS — I493 Ventricular premature depolarization: Secondary | ICD-10-CM

## 2014-10-24 DIAGNOSIS — I119 Hypertensive heart disease without heart failure: Secondary | ICD-10-CM

## 2014-10-24 DIAGNOSIS — R634 Abnormal weight loss: Secondary | ICD-10-CM

## 2014-10-24 NOTE — Progress Notes (Signed)
Cardiology Office Note   Date:  10/24/2014   ID:  Vanessa Clayton, Vanessa Clayton 1926-02-10, MRN [id number]  PCP:  Darlin Coco, MD  Cardiologist:   Darlin Coco, MD   No chief complaint on file.     History of Present Illness: Vanessa Clayton is a 81 y.o. female who presents for a six-month follow-up office visit  This pleasant 81 year old woman is seen for a scheduled 6 month followup office visit. She has a past history of essential hypertension and history of hypercholesterolemia. She has a remote history of thyrotoxicosis. She's also had a past history of breast cancer of the left breast.At her last office visit because of weight loss we got a chest x-ray which was unremarkable. She has continued to lose weight. Over the past 6 months she has lost another 7 pounds. Her appetite is only fair.  She attributes her weight loss and her poor appetite to the fact that she is under a lot of stress caring for her husband who has started to develop symptoms of Alzheimer's and dementia.  She states that her PCP has done a lot of blood tests and no cause otherwise for her weight loss has been found. Her shortness of breath is unchanged. She had an echocardiogram in 07/04/13 showing an ejection fraction of 55-60% with grade 1 diastolic dysfunction. She has had some sensation that her muscles are weak and she has morning stiffness.  Past Medical History  Diagnosis Date  . HTN (hypertension)   . Breast cancer     left  . Hypothyroidism     following treatment for hyperthyroidism  . Hyperthyroidism   . Chronic anemia   . Vitamin D deficiency   . Chronic anxiety     Past Surgical History  Procedure Laterality Date  . Breast lumpectomy  1998    left with radiation  . Knee arthroscopy  10/15/05     Current Outpatient Prescriptions  Medication Sig Dispense Refill  . aspirin 81 MG tablet Take 81 mg by mouth daily.      Marland Kitchen atenolol (TENORMIN) 50 MG tablet TAKE 1 TABLET BY MOUTH EVERY DAY 30  tablet 0  . ENSURE (ENSURE) Take 237 mLs by mouth as directed. 1-2 cans a day    . Escitalopram Oxalate (LEXAPRO PO) Take by mouth as needed.      . Ferrous Sulfate (SLOW FE PO) Take by mouth daily.      . hydrochlorothiazide (HYDRODIURIL) 25 MG tablet TAKE 1/2 TABLET BY MOUTH DAILY 45 tablet 0  . levothyroxine (SYNTHROID, LEVOTHROID) 75 MCG tablet Take 88 mcg by mouth.     . Vitamin D, Ergocalciferol, (DRISDOL) 50000 UNITS CAPS 50,000 Units by Per NG tube route Daily.     No current facility-administered medications for this visit.    Allergies:   Penicillins    Social History:  The patient  reports that she has never smoked. She does not have any smokeless tobacco history on file. She reports that she does not use illicit drugs.   Family History:  The patient's family history includes Hypertension in her mother.    ROS:  Please see the history of present illness.   Otherwise, review of systems are positive for none.   All other systems are reviewed and negative.    PHYSICAL EXAM: VS:  BP 146/82 mmHg  Pulse 77  Ht 5\' 7"  (1.702 m)  Wt 127 lb (57.607 kg)  BMI 19.89 kg/m2 , BMI Body mass index  is 19.89 kg/(m^2). GEN: Well nourished, well developed, in no acute distress HEENT: normal Neck: no JVD, carotid bruits, or masses Cardiac: RRR; no murmurs, rubs, or gallops,no edema  Respiratory:  clear to auscultation bilaterally, normal work of breathing GI: soft, nontender, nondistended, + BS MS: no deformity or atrophy Skin: warm and dry, no rash Neuro:  Strength and sensation are intact Psych: euthymic mood, full affect   EKG:  EKG is ordered today. The ekg ordered today demonstrates normal sinus rhythm with occasional PVCs.  Since the prior tracing of 12/09/12, no significant change.   Recent Labs: 01/06/2014: ALT 9; BUN 28*; Creatinine 1.2; Hemoglobin 10.4*; Platelets 162.0; Potassium 4.1; Sodium 139    Lipid Panel    Component Value Date/Time   CHOL 142 01/06/2014 1040    TRIG 58.0 01/06/2014 1040   HDL 50.10 01/06/2014 1040   CHOLHDL 3 01/06/2014 1040   VLDL 11.6 01/06/2014 1040   LDLCALC 80 01/06/2014 1040      Wt Readings from Last 3 Encounters:  10/24/14 127 lb (57.607 kg)  01/05/14 134 lb (60.782 kg)  06/17/13 141 lb (63.957 kg)        ASSESSMENT AND PLAN:  1. Unintentional weight loss, probably secondary to emotional stress from looking after her husband with dementia 2. history of hypercholesterolemia 3. diastolic dysfunction by echocardiogram 07/04/13 4. history of breast cancer 5. prior history of hyperthyroidism     Current medicines are reviewed at length with the patient today.  The patient does not have concerns regarding medicines.  The following changes have been made:  no change    Orders Placed This Encounter  Procedures  . EKG 12-Lead     Disposition:   FU with Dr. Mare Ferrari in 6 months.  Continue current medication.  She will try to increase her nutritional supplements.  She does not like Ensure but she does not mind boost   Signed, Darlin Coco, MD  10/24/2014 12:57 PM    [facility] [place], [place], [place]  [address] Phone: [phone]; Fax: [phone]

## 2014-10-24 NOTE — Patient Instructions (Signed)
Your physician recommends that you continue on your current medications as directed. Please refer to the Current Medication list given to you today.  Your physician wants you to follow-up in: 6 month ov You will receive a reminder letter in the mail two months in advance. If you don't receive a letter, please call our office to schedule the follow-up appointment.  

## 2014-11-01 ENCOUNTER — Other Ambulatory Visit: Payer: Self-pay | Admitting: Cardiology

## 2014-12-25 ENCOUNTER — Other Ambulatory Visit: Payer: Self-pay | Admitting: Cardiology

## 2014-12-26 ENCOUNTER — Other Ambulatory Visit: Payer: Self-pay

## 2014-12-26 MED ORDER — HYDROCHLOROTHIAZIDE 25 MG PO TABS: 12.5000 mg | ORAL_TABLET | Freq: Every day | ORAL | Status: DC

## 2015-02-26 ENCOUNTER — Other Ambulatory Visit: Payer: Self-pay

## 2015-03-02 ENCOUNTER — Ambulatory Visit: Payer: Medicare PPO | Admitting: Nurse Practitioner

## 2015-03-12 ENCOUNTER — Ambulatory Visit: Payer: Medicare PPO | Admitting: Cardiology

## 2015-03-13 ENCOUNTER — Telehealth: Payer: Self-pay | Admitting: Cardiology

## 2015-03-13 NOTE — Telephone Encounter (Signed)
New message  Daughter called states that the patient fell on June 28th. Pt states that she feels better but the daughter states that she is not feeling better. The pt states that waiting until August's appt maybe a little to late. Doesn't want to take the pt to the hospital please call back to discuss

## 2015-03-13 NOTE — Telephone Encounter (Signed)
Left message to call back  

## 2015-03-15 ENCOUNTER — Encounter: Payer: Self-pay | Admitting: Cardiology

## 2015-03-15 NOTE — Telephone Encounter (Signed)
Follow up     Daughter states that pt health has declined more since Tuesday.  Pt family is not wanting to take pt to e/r, wanting to know if pt could be seen sooner. Pt fell on June 28th, mobility seems to be the same, health has not improved. Please call to discuss.

## 2015-03-15 NOTE — Telephone Encounter (Signed)
Spoke with daughter and she stated patients health has been declining Patient fell recently and hit her head, decreased energy and appetite  Patient has been experiencing problems with constipation which she has had before and had to have surgical procedure to correct (daughter unsure of problem) Patient normally cares for her husband but per daughter is not even able to care for herself properly Discussed with  Dr. Mare Ferrari and given the multiple complaints he feels patient should go to ED for evaluation/labs/xrays Advised daughter, verbalized understanding

## 2015-03-19 ENCOUNTER — Encounter (HOSPITAL_COMMUNITY): Payer: Self-pay | Admitting: *Deleted

## 2015-03-19 ENCOUNTER — Emergency Department (HOSPITAL_COMMUNITY): Payer: Medicare PPO

## 2015-03-19 ENCOUNTER — Emergency Department (HOSPITAL_COMMUNITY)
Admission: EM | Admit: 2015-03-19 | Discharge: 2015-03-19 | Disposition: A | Discharge status: Home / Self Care | Payer: Medicare PPO | Attending: Emergency Medicine | Admitting: Emergency Medicine

## 2015-03-19 DIAGNOSIS — M25552 Pain in left hip: Secondary | ICD-10-CM | POA: Diagnosis not present

## 2015-03-19 DIAGNOSIS — F419 Anxiety disorder, unspecified: Secondary | ICD-10-CM | POA: Insufficient documentation

## 2015-03-19 DIAGNOSIS — Z7982 Long term (current) use of aspirin: Secondary | ICD-10-CM | POA: Insufficient documentation

## 2015-03-19 DIAGNOSIS — D649 Anemia, unspecified: Secondary | ICD-10-CM | POA: Insufficient documentation

## 2015-03-19 DIAGNOSIS — Z853 Personal history of malignant neoplasm of breast: Secondary | ICD-10-CM | POA: Diagnosis not present

## 2015-03-19 DIAGNOSIS — E559 Vitamin D deficiency, unspecified: Secondary | ICD-10-CM | POA: Insufficient documentation

## 2015-03-19 DIAGNOSIS — I10 Essential (primary) hypertension: Secondary | ICD-10-CM | POA: Diagnosis not present

## 2015-03-19 DIAGNOSIS — E039 Hypothyroidism, unspecified: Secondary | ICD-10-CM | POA: Insufficient documentation

## 2015-03-19 DIAGNOSIS — M79605 Pain in left leg: Secondary | ICD-10-CM | POA: Diagnosis present

## 2015-03-19 DIAGNOSIS — M25561 Pain in right knee: Secondary | ICD-10-CM | POA: Insufficient documentation

## 2015-03-19 DIAGNOSIS — M25562 Pain in left knee: Secondary | ICD-10-CM | POA: Diagnosis not present

## 2015-03-19 DIAGNOSIS — Z880 Allergy status to penicillin: Secondary | ICD-10-CM | POA: Diagnosis not present

## 2015-03-19 DIAGNOSIS — Z79899 Other long term (current) drug therapy: Secondary | ICD-10-CM | POA: Insufficient documentation

## 2015-03-19 DIAGNOSIS — R5383 Other fatigue: Secondary | ICD-10-CM | POA: Diagnosis not present

## 2015-03-19 DIAGNOSIS — M25572 Pain in left ankle and joints of left foot: Secondary | ICD-10-CM | POA: Insufficient documentation

## 2015-03-19 LAB — COMPREHENSIVE METABOLIC PANEL
ALK PHOS: 62 U/L (ref 38–126)
ALT: 25 U/L (ref 14–54)
AST: 32 U/L (ref 15–41)
Albumin: 3.2 g/dL — ABNORMAL LOW (ref 3.5–5.0)
Anion gap: 9 (ref 5–15)
BILIRUBIN TOTAL: 0.4 mg/dL (ref 0.3–1.2)
BUN: 33 mg/dL — ABNORMAL HIGH (ref 6–20)
CO2: 26 mmol/L (ref 22–32)
CREATININE: 1.48 mg/dL — AB (ref 0.44–1.00)
Calcium: 9.8 mg/dL (ref 8.9–10.3)
Chloride: 101 mmol/L (ref 101–111)
GFR calc Af Amer: 36 mL/min — ABNORMAL LOW (ref >60)
GFR, EST NON AFRICAN AMERICAN: 31 mL/min — AB (ref >60)
Glucose, Bld: 94 mg/dL (ref 65–99)
POTASSIUM: 4.6 mmol/L (ref 3.5–5.1)
SODIUM: 136 mmol/L (ref 135–145)
TOTAL PROTEIN: 7.7 g/dL (ref 6.5–8.1)

## 2015-03-19 LAB — CBC WITH DIFFERENTIAL/PLATELET
BASOS PCT: 0 % (ref 0–1)
Basophils Absolute: 0 10*3/uL (ref 0.0–0.1)
EOS PCT: 1 % (ref 0–5)
Eosinophils Absolute: 0.1 10*3/uL (ref 0.0–0.7)
HCT: 32.4 % — ABNORMAL LOW (ref 36.0–46.0)
HEMOGLOBIN: 10.5 g/dL — AB (ref 12.0–15.0)
LYMPHS ABS: 2.9 10*3/uL (ref 0.7–4.0)
LYMPHS PCT: 34 % (ref 12–46)
MCH: 24.1 pg — AB (ref 26.0–34.0)
MCHC: 32.4 g/dL (ref 30.0–36.0)
MCV: 74.5 fL — ABNORMAL LOW (ref 78.0–100.0)
MONO ABS: 0.7 10*3/uL (ref 0.1–1.0)
Monocytes Relative: 8 % (ref 3–12)
Neutro Abs: 4.8 10*3/uL (ref 1.7–7.7)
Neutrophils Relative %: 57 % (ref 43–77)
Platelets: 390 10*3/uL (ref 150–400)
RBC: 4.35 MIL/uL (ref 3.87–5.11)
RDW: 12.7 % (ref 11.5–15.5)
WBC: 8.5 10*3/uL (ref 4.0–10.5)

## 2015-03-19 LAB — URINALYSIS, ROUTINE W REFLEX MICROSCOPIC
Bilirubin Urine: NEGATIVE
Glucose, UA: NEGATIVE mg/dL
HGB URINE DIPSTICK: NEGATIVE
Ketones, ur: NEGATIVE mg/dL
Nitrite: NEGATIVE
Protein, ur: NEGATIVE mg/dL
SPECIFIC GRAVITY, URINE: 1.015 (ref 1.005–1.030)
Urobilinogen, UA: 1 mg/dL (ref 0.0–1.0)
pH: 7.5 (ref 5.0–8.0)

## 2015-03-19 LAB — URINE MICROSCOPIC-ADD ON

## 2015-03-19 LAB — TSH: TSH: 2.448 u[IU]/mL (ref 0.350–4.500)

## 2015-03-19 NOTE — Care Management Note (Signed)
Case Management Note  Patient Details  Name: Vanessa Clayton MRN: [id number] Date of Birth: Jan 09, 1926  Subjective/Objective:     81 yo female in ER for leg pain./Home with spouse; has support of daughter, son-in-law and grandson     Action/Plan: Access for disposition needs  Expected Discharge Date:        03/19/15          Expected Discharge Plan:  [place]  In-House Referral:     Discharge planning Services  CM Consult  Post Acute Care Choice:  Home Health Choice offered to:  Adult Children, Spouse  DME Arranged:    DME Agency:     HH Arranged:  RN, PT, Nurse's Aide HH Agency:  [place] [facility] [place]  Status of Service:  Completed, signed off  Medicare Important Message Given:    Date Medicare IM Given:    Medicare IM give by:    Date Additional Medicare IM Given:    Additional Medicare Important Message give by:     If discussed at [place] of Stay Meetings, dates discussed:    Additional Comments: Janella Rogala J. Clydene Laming, RN, BSN, [employer] [phone] Spoke with pt spouse and daughter at bedside regarding discharge planning for [place]. Offered pt list of home health agencies to choose from.  Pt family chose [facility] to render services. Edwinna Areola of [facility] notified.  No DME needs identified at this time.  NCM contacted [employer] to refer pt and husband for mobile meals.  Public librarian will contact pt in 2-3 days to set up home visit.   Fuller Mandril, RN 03/19/2015, 2:30 PM

## 2015-03-19 NOTE — ED Notes (Signed)
Pt reports left leg pain and difficulty bear weight due to pain. Pt in a brace from home at triage. Pt is scheduled for an MRI on Thursday. Pt family wants bt to be evaluated for a fall in June where she landed on the rt side. Denies LOC. Pt reports decreased energy and appetite as well.

## 2015-03-19 NOTE — ED Notes (Signed)
Patient transported to X-ray 

## 2015-03-19 NOTE — ED Provider Notes (Signed)
CSN: [id number]     Arrival date & time 03/19/15  1235 History   First MD Initiated Contact with Patient 03/19/15 1314     Chief Complaint  Patient presents with  . Leg Pain     (Consider location/radiation/quality/duration/timing/severity/associated sxs/prior Treatment) HPI Patient is an 81 year old female with a history of hypertension, hypothyroidism, chronic anemia presenting today for 1 month of increasing fatigue and left leg pain. She reports a fall onto her right side 3 weeks ago. She has no pain on her right side however and has had increasing left-sided pain. She was seen by orthopedics last week who has sent her for an MRI this week. She has also been having increasing pain in her left knee and left hip. She denies any recent falls however. Of note, she has been unable to ambulate in her house due to this. She has had to slide up the stairs on her bottom in order to reach the top. She denies any cough, chest pain, nausea, vomiting, abdominal pain, dysuria, frequency or palpitations at this time.  Pain is located in her left hip left knee and left ankle. All are nonradiating. They are aching in nature and rated as a 5 out of 10. There exacerbated by walking and alleviated by rest.  Past Medical History  Diagnosis Date  . HTN (hypertension)   . Breast cancer     left  . Hypothyroidism     following treatment for hyperthyroidism  . Hyperthyroidism   . Chronic anemia   . Vitamin D deficiency   . Chronic anxiety    Past Surgical History  Procedure Laterality Date  . Breast lumpectomy  1998    left with radiation  . Knee arthroscopy  10/15/05  . Colon surgery     Family History  Problem Relation Age of Onset  . Hypertension Mother    History  Substance Use Topics  . Smoking status: Never Smoker   . Smokeless tobacco: Not on file  . Alcohol Use: Not on file   OB History    No data available     Review of Systems  Constitutional: Positive for fatigue. Negative for  fever and chills.  HENT: Negative for congestion and sore throat.   Eyes: Negative for pain.  Respiratory: Negative for cough and shortness of breath.   Cardiovascular: Negative for chest pain and palpitations.  Gastrointestinal: Negative for nausea, vomiting, abdominal pain and diarrhea.  Genitourinary: Negative for dysuria and flank pain.  Musculoskeletal: Positive for arthralgias (bilateral knee and left hip pain.  Left ankle pain). Negative for back pain and neck pain.  Skin: Negative for rash.  Allergic/Immunologic: Negative.   Neurological: Negative for dizziness and light-headedness.  Psychiatric/Behavioral: Negative for confusion.   Allergies  Penicillins  Home Medications   Prior to Admission medications   Medication Sig Start Date End Date Taking? Authorizing Provider  aspirin 81 MG tablet Take 81 mg by mouth daily.      Historical Provider, MD  atenolol (TENORMIN) 50 MG tablet TAKE 1 TABLET BY MOUTH EVERY DAY 11/02/14   Darlin Coco, MD  ENSURE (ENSURE) Take 237 mLs by mouth as directed. 1-2 cans a day    Historical Provider, MD  Escitalopram Oxalate (LEXAPRO PO) Take by mouth as needed.      Historical Provider, MD  Ferrous Sulfate (SLOW FE PO) Take by mouth daily.      Historical Provider, MD  hydrochlorothiazide (HYDRODIURIL) 25 MG tablet Take 0.5 tablets (12.5 mg total) by  mouth daily. 12/26/14   Darlin Coco, MD  levothyroxine (SYNTHROID, LEVOTHROID) 75 MCG tablet Take 88 mcg by mouth.     Historical Provider, MD  Vitamin D, Ergocalciferol, (DRISDOL) 50000 UNITS CAPS 50,000 Units by Per NG tube route Daily. 06/21/11   Historical Provider, MD   BP 124/84 mmHg  Pulse 81  Temp(Src) 98 F (36.7 C) (Oral)  Resp 18  SpO2 99% Physical Exam  Constitutional: She is oriented to person, place, and time. She appears well-developed and well-nourished. No distress.  HENT:  Head: Normocephalic and atraumatic.  Eyes: Conjunctivae and EOM are normal. Pupils are equal,  round, and reactive to light.  Neck: Normal range of motion. Neck supple.  Cardiovascular: Normal rate, regular rhythm and normal heart sounds.   Pulmonary/Chest: Effort normal and breath sounds normal. No respiratory distress.  Abdominal: Soft. Bowel sounds are normal. There is no tenderness. There is no CVA tenderness.  Musculoskeletal: Normal range of motion.       Left hip: Normal.       Left knee: She exhibits normal range of motion, no swelling, no effusion, no ecchymosis, no deformity, no laceration, normal alignment, no LCL laxity, normal patellar mobility and no bony tenderness. Tenderness found. Patellar tendon tenderness noted.       Left ankle: She exhibits normal range of motion, no swelling, no ecchymosis, no deformity, no laceration and normal pulse. Tenderness. Achilles tendon normal.  Bilateral LE neurovascularly intact.   Neurological: She is alert and oriented to person, place, and time. She has normal strength and normal reflexes. She displays normal reflexes. No cranial nerve deficit or sensory deficit. GCS eye subscore is 4. GCS verbal subscore is 5. GCS motor subscore is 6.  Skin: Skin is warm and dry. She is not diaphoretic.  Psychiatric: She has a normal mood and affect.    ED Course  Procedures (including critical care time) Labs Review Labs Reviewed  CBC WITH DIFFERENTIAL/PLATELET - Abnormal; Notable for the following:    Hemoglobin 10.5 (*)    HCT 32.4 (*)    MCV 74.5 (*)    MCH 24.1 (*)    All other components within normal limits  COMPREHENSIVE METABOLIC PANEL - Abnormal; Notable for the following:    BUN 33 (*)    Creatinine, Ser 1.48 (*)    Albumin 3.2 (*)    GFR calc non Af Amer 31 (*)    GFR calc Af Amer 36 (*)    All other components within normal limits  URINALYSIS, ROUTINE W REFLEX MICROSCOPIC (NOT AT [facility]) - Abnormal; Notable for the following:    Leukocytes, UA MODERATE (*)    All other components within normal limits  URINE MICROSCOPIC-ADD ON  - Abnormal; Notable for the following:    Squamous Epithelial / LPF MANY (*)    Bacteria, UA FEW (*)    All other components within normal limits  URINE CULTURE  TSH    Imaging Review Dg Knee Complete 4 Views Left  03/19/2015   CLINICAL DATA:  LEFT leg pain difficulty bearing weight due to pain for approximately 1 month, no injury, history hypertension, breast cancer  EXAM: LEFT KNEE - COMPLETE 4+ VIEW  COMPARISON:  None ; correlation MRI LEFT knee 10/03/2005  FINDINGS: Osseous demineralization.  Tricompartmental osteoarthritic changes with joint space narrowing and marginal spur formation.  Chondrocalcinosis question CPPD/pseudogout.  No acute fracture, dislocation, or bone destruction.  No knee joint effusion.  Small patellar spur at quadriceps tendon insertion.  IMPRESSION:  Tricompartmental osteoarthritic changes LEFT knee and question CPPD.  No acute abnormalities.   Electronically Signed   By: Lavonia Dana M.D.   On: 03/19/2015 14:37   Dg Hip Unilat With Pelvis 2-3 Views Left  03/19/2015   CLINICAL DATA:  Left leg pain with difficulty weight-bearing, 1 month duration.  EXAM: DG HIP (WITH OR WITHOUT PELVIS) 2-3V LEFT  COMPARISON:  None.  FINDINGS: The left hip appears normal without joint space narrowing or osteophyte formation. No sign of avascular necrosis or traumatic finding. The left hemipelvis appears normal. Symphysis pubis appears normal. The right hip shows advanced osteoarthritis with joint space narrowing, sclerosis and marginal osteophytes.  IMPRESSION: Left hip appears normal.  Advanced osteoarthritis of the right hip.   Electronically Signed   By: Nelson Chimes M.D.   On: 03/19/2015 14:37     EKG Interpretation None      MDM   Final diagnoses:  Leg pain, diffuse, left   Patient is an 81 year old female with a history of hypertension, hypothyroidism, chronic anemia presenting today for 1 month of increasing fatigue and left leg pain  On initial evaluation patient was  hemodynamically stable and in no acute distress. She appeared very comfortable in bed in all extremities had full range of motion. Only mild tenderness to palpation over her left knee and her left hip. There is no swelling. She did have crepitus to her left knee making suspicious for osteoarthritis. X-ray of left knee and hip performed showing no acute fractures or malalignments but confirming osteoarthritis. Basic labs performed showing her baseline anemia with no leukocytosis.  Creatinine mildly elevated from baseline and encouraged by mouth hydration. Also advised to follow-up with her PCP in 3-5 days for repeat labs.TSH within normal limits. UA performed showing possible infection. Patient asymptomatic at this time and will follow up on cultures and will treat if positive.   Discussed with social work and have placed consult for home health nursing and physical therapy to evaluate the patient at home. Family members at bedside to confirm that they will be able to help patient at home, and feel comfortable providing help until she receives the appropriate help she needs.  If performed, labs, EKGs, and imaging were reviewed/interpreted by myself and my attending and incorporated into medical decision making.  Discussed pertinent finding with patient or caregiver prior to discharge with no further questions.  Immediate return precautions given and pt or caregiver reports understanding.  Pt care supervised by my attending Dr. Loistine Chance, MD PGY-2  Emergency Medicine     Geronimo Boot, MD 03/19/15 8527  Davonna Belling, MD 03/20/15 [phone]

## 2015-03-20 ENCOUNTER — Telehealth: Payer: Self-pay | Admitting: Cardiology

## 2015-03-20 NOTE — Telephone Encounter (Signed)
Patient only in ED No new medications started however home health nursing arranged, coming out tomorrow for first visit Appointment with Erik Obey 04/09/15, daughter aware Lab recheck mentioned in ED report  Will forward to  Dr. Mare Ferrari for review and discission on what labs to be done

## 2015-03-20 NOTE — Telephone Encounter (Signed)
Would get CBC, anemia panel, BMET, sed rate, amylase, lipase

## 2015-03-20 NOTE — Telephone Encounter (Signed)
Follow up   [place] pt  was released from hospital on 03/18/18 and her discharge letter states follow up with Dr.Brackbill   03/27/15 @ 4:15pm is a slot i dont know if you want to open it for pt please call pt

## 2015-03-21 LAB — URINE CULTURE

## 2015-03-21 NOTE — Telephone Encounter (Signed)
Verbal order given to [facility] at [facility] Daughter aware spoke with [facility] regarding labs and they should be done at visit tomorrow

## 2015-03-22 ENCOUNTER — Telehealth: Payer: Self-pay | Admitting: Cardiology

## 2015-03-22 NOTE — Telephone Encounter (Signed)
New message    Needs verbal authorization/order for pt to have occupational therapy. Please call to discuss

## 2015-03-22 NOTE — Telephone Encounter (Signed)
Per nurse systolic blood pressure dropped from 100's down to 90 when standing, no medication at that time  Nurse felt patient needed to be seen sooner than 8/8 Called daughter and scheduled ov for Monday with Sharyn Lull L PA   V/O ok given

## 2015-03-23 ENCOUNTER — Encounter: Payer: Self-pay | Admitting: Cardiology

## 2015-03-26 ENCOUNTER — Ambulatory Visit (INDEPENDENT_AMBULATORY_CARE_PROVIDER_SITE_OTHER): Payer: Medicare PPO | Admitting: Physician Assistant

## 2015-03-26 ENCOUNTER — Encounter: Payer: Self-pay | Admitting: Physician Assistant

## 2015-03-26 VITALS — Ht 65.0 in | Wt 116.0 lb

## 2015-03-26 DIAGNOSIS — R634 Abnormal weight loss: Secondary | ICD-10-CM

## 2015-03-26 DIAGNOSIS — I951 Orthostatic hypotension: Secondary | ICD-10-CM | POA: Diagnosis not present

## 2015-03-26 HISTORY — DX: Orthostatic hypotension: I95.1

## 2015-03-26 NOTE — Progress Notes (Signed)
Cardiology Office Note   Date:  03/26/2015   ID:  Odie, Rauen 1926/05/24, MRN [id number]  PCP:  Warren Danes, MD  Cardiologist: Dr. Mare Ferrari Chief Complaint:dizzy, low blood pressure    History of Present Illness: Vanessa Clayton is a 81 y.o. female who presents for post-emergency room follow-up for orthostatic hypotension. She has a history of hypertension, hyperlipidemia, diastolic dysfunction by echo in 2014 and history of breast cancer.  She saw Dr. Mare Ferrari in 10/2014 at which time she had unintentional weight loss probably secondary to emotional stress. She is looking after her husband with dementia. Her brother just died 2 weeks ago. Her weight last year was 134, and February 2000 127, and today she weighs 116 pounds. She is not eating well or drinking. She is accompanied by her granddaughter who says they are bringing her food to eat and an encouraging her to drink boost or Ensure. She went to the ER for leg pain and x-rays were negative. Creatinine was minimally elevated and they sent home health to check on her. Her blood pressures have been very low and they called to arrange her appointment today. She states she is taking her time getting up but she does get dizzy when she stands up. She has not fallen. She is being asked her careful. Her main complaint is left upper thigh pain.  Past Medical History  Diagnosis Date  . HTN (hypertension)   . Breast cancer     left  . Hypothyroidism     following treatment for hyperthyroidism  . Hyperthyroidism   . Chronic anemia   . Vitamin D deficiency   . Chronic anxiety     Past Surgical History  Procedure Laterality Date  . Breast lumpectomy  1998    left with radiation  . Knee arthroscopy  10/15/05  . Colon surgery       Current Outpatient Prescriptions  Medication Sig Dispense Refill  . aspirin 81 MG tablet Take 81 mg by mouth daily.      Marland Kitchen atenolol (TENORMIN) 50 MG tablet TAKE 1 TABLET BY MOUTH EVERY DAY 90 tablet 1  .  ENSURE (ENSURE) Take 237 mLs by mouth as directed. 1-2 cans a day    . Escitalopram Oxalate (LEXAPRO PO) Take by mouth daily.     . Ferrous Sulfate (SLOW FE PO) Take by mouth daily.      . hydrochlorothiazide (HYDRODIURIL) 25 MG tablet Take 0.5 tablets (12.5 mg total) by mouth daily. 45 tablet 1  . levothyroxine (SYNTHROID, LEVOTHROID) 75 MCG tablet Take 88 mcg by mouth.     . Vitamin D, Ergocalciferol, (DRISDOL) 50000 UNITS CAPS 50,000 Units by Per NG tube route Daily.    . traMADol (ULTRAM) 50 MG tablet TK 1 T PO  Q 4 TO 6 H PRF PAIN  0   No current facility-administered medications for this visit.    Allergies:   Penicillins    Social History:  The patient  reports that she has never smoked. She does not have any smokeless tobacco history on file. She reports that she does not use illicit drugs.   Family History:  The patient's    family history includes Hypertension in her mother.    ROS:  Please see the history of present illness.   Otherwise, review of systems are positive for none.   All other systems are reviewed and negative.    PHYSICAL EXAM: VS:  Ht 5\' 5"  (1.651 m)  Wt 116 lb (  52.617 kg)  BMI 19.30 kg/m2 , BMI Body mass index is 19.3 kg/(m^2). GEN: Well nourished, well developed, in no acute distress Neck: no JVD, HJR, carotid bruits, or masses Cardiac: RRR; no murmurs,gallop, rubs, thrill or heave,  Respiratory:  clear to auscultation bilaterally, normal work of breathing GI: soft, nontender, nondistended, + BS MS: no deformity or atrophy Extremities: without cyanosis, clubbing, edema, good distal pulses bilaterally.  Skin: Positive for tenting, warm and dry, no rash Neuro:  Strength and sensation are intact    EKG:  EKG is not ordered today.    Recent Labs: 03/19/2015: ALT 25; BUN 33*; Creatinine, Ser 1.48*; Hemoglobin 10.5*; Platelets 390; Potassium 4.6; Sodium 136; TSH 2.448    Lipid Panel    Component Value Date/Time   CHOL 142 01/06/2014 1040   TRIG  58.0 01/06/2014 1040   HDL 50.10 01/06/2014 1040   CHOLHDL 3 01/06/2014 1040   VLDL 11.6 01/06/2014 1040   LDLCALC 80 01/06/2014 1040      Wt Readings from Last 3 Encounters:  03/26/15 116 lb (52.617 kg)  10/24/14 127 lb (57.607 kg)  01/05/14 134 lb (60.782 kg)      Other studies Reviewed: Additional studies/ records that were reviewed today include and review of the records demonstrates:  IMPRESSION: Left hip appears normal.  Advanced osteoarthritis of the right hip.     Electronically Signed   By: Nelson Chimes M.D.   On: 03/19/2015 14:37  Tricompartmental osteoarthritic changes LEFT knee and question CPPD.   No acute abnormalities.    ASSESSMENT AND PLAN:  Orthostatic hypotension Patient is significantly orthostatic here in the office today. She has tenting of her skin. She is obviously dehydrated and overmedicated. She has lost about 18 pounds over the past year. She is depressed because her brother died and stressed about taking care of her husband with Alzheimer's. I will stop hydrochlorothiazide and Tenormin today. She rates it these drugs today. Home health will continue to monitor her blood pressures. I will see her back in 2 weeks and reassess her kidney function at that time. I've asked her to be very careful getting up and changing positions. Increase food and water intake.  Weight loss, non-intentional Patient has lost 18 pounds over the past year due to emotional stress. Encourage increase in food intake. Her family seems attentive and bringing her meals.    Sumner Boast, PA-C  03/26/2015 11:12 AM    [facility] [address], [place], [place]  [address] Phone: [phone]; Fax: [phone]

## 2015-03-26 NOTE — Assessment & Plan Note (Signed)
Patient has lost 18 pounds over the past year due to emotional stress. Encourage increase in food intake. Her family seems attentive and bringing her meals.

## 2015-03-26 NOTE — Patient Instructions (Addendum)
Medication Instructions:  STOP Hydorcholothizide and Atenolol  Labwork: On return visit repeat bmet  Testing/Procedures: -None  Follow-Up: Your physician recommends that you keep scheduled  follow-up appointment with Ermalinda Barrios, PA   Any Other Special Instructions Will Be Listed Below (If Applicable).  Increase Fluids and Food Intake

## 2015-03-26 NOTE — Assessment & Plan Note (Signed)
Patient is significantly orthostatic here in the office today. She has tenting of her skin. She is obviously dehydrated and overmedicated. She has lost about 18 pounds over the past year. She is depressed because her brother died and stressed about taking care of her husband with Alzheimer's. I will stop hydrochlorothiazide and Tenormin today. She rates it these drugs today. Home health will continue to monitor her blood pressures. I will see her back in 2 weeks and reassess her kidney function at that time. I've asked her to be very careful getting up and changing positions. Increase food and water intake.

## 2015-03-27 ENCOUNTER — Telehealth: Payer: Self-pay | Admitting: Cardiology

## 2015-03-27 NOTE — Telephone Encounter (Signed)
Left message to call back  

## 2015-03-27 NOTE — Telephone Encounter (Signed)
New message       Calling to let Dr Mare Ferrari know that pending patient's MRI results and because pt is in pain, he want patient to have physical therapy.  Pt is refusing now because of the pain. Will this be ok with Dr Mare Ferrari

## 2015-03-29 NOTE — Telephone Encounter (Signed)
Spoke with Simona Huh and he just wanted to let  Dr. Mare Ferrari know PT on hold for now  Will forward so he will be aware

## 2015-03-29 NOTE — Telephone Encounter (Signed)
Ok to hold PT for now.

## 2015-04-02 ENCOUNTER — Ambulatory Visit: Payer: Medicare PPO | Admitting: Physician Assistant

## 2015-04-09 ENCOUNTER — Other Ambulatory Visit: Payer: Self-pay | Admitting: *Deleted

## 2015-04-09 ENCOUNTER — Encounter: Payer: Self-pay | Admitting: Physician Assistant

## 2015-04-09 ENCOUNTER — Ambulatory Visit (INDEPENDENT_AMBULATORY_CARE_PROVIDER_SITE_OTHER): Payer: Medicare PPO | Admitting: Physician Assistant

## 2015-04-09 VITALS — BP 126/75 | HR 127 | Ht 65.0 in | Wt 114.0 lb

## 2015-04-09 DIAGNOSIS — I493 Ventricular premature depolarization: Secondary | ICD-10-CM

## 2015-04-09 DIAGNOSIS — I119 Hypertensive heart disease without heart failure: Secondary | ICD-10-CM

## 2015-04-09 DIAGNOSIS — R000 Tachycardia, unspecified: Secondary | ICD-10-CM

## 2015-04-09 DIAGNOSIS — I471 Supraventricular tachycardia: Secondary | ICD-10-CM

## 2015-04-09 DIAGNOSIS — I951 Orthostatic hypotension: Secondary | ICD-10-CM | POA: Diagnosis not present

## 2015-04-09 DIAGNOSIS — R634 Abnormal weight loss: Secondary | ICD-10-CM

## 2015-04-09 HISTORY — DX: Tachycardia, unspecified: R00.0

## 2015-04-09 LAB — BASIC METABOLIC PANEL
BUN: 19 mg/dL (ref 6–23)
CALCIUM: 9.6 mg/dL (ref 8.4–10.5)
CHLORIDE: 95 meq/L — AB (ref 96–112)
CO2: 24 meq/L (ref 19–32)
Creatinine, Ser: 1.17 mg/dL (ref 0.40–1.20)
GFR: 56.3 mL/min — AB (ref >60.00)
Glucose, Bld: 91 mg/dL (ref 70–99)
POTASSIUM: 3.9 meq/L (ref 3.5–5.1)
SODIUM: 132 meq/L — AB (ref 135–145)

## 2015-04-09 MED ORDER — ATENOLOL 50 MG PO TABS: 25.0000 mg | ORAL_TABLET | Freq: Every day | ORAL | Status: DC

## 2015-04-09 NOTE — Progress Notes (Signed)
Cardiology Office Note   Date:  04/09/2015   ID:  Vanessa, Clayton [birth year], MRN [id number]  PCP:  Warren Danes, MD  Cardiologist:  Dr. Mare Ferrari  Chief Complaint: Dizziness    History of Present Illness: Vanessa Clayton is a 81 y.o. female who presents for follow-up of orthostatic hypotension. I saw this patient on 03/26/15 for significant orthostatic hypotension. She had lost 18 pounds and was not eating or drinking. She is taking care of her husband with Alzheimer's and she was depressed because her brother had died 2 weeks prior to coming in. I stopped her HCTZ and Tenormin. Home health was checking on her. BUN and was 33 creatinine 1.48 when she was in the emergency room 03/18/25. Hemoglobin was 10.5 which was unchanged since 06/2014.  Patient has a history of hypertension, hyperlipidemia, and diastolic dysfunction by echo in 2014, history of breast cancer.  Patient comes in today and says her dizziness has resolved. She is not eating because she is now constipated. She is drinking better. She feels much better. Her heart rate is 130 bpm but she is asymptomatic. She is accompanied by her granddaughter again. She does get out of breath with little exertion. She has back spasms and chronic back pain causing weakness in her legs.    Past Medical History  Diagnosis Date  . HTN (hypertension)   . Breast cancer     left  . Hypothyroidism     following treatment for hyperthyroidism  . Hyperthyroidism   . Chronic anemia   . Vitamin D deficiency   . Chronic anxiety     Past Surgical History  Procedure Laterality Date  . Breast lumpectomy  1998    left with radiation  . Knee arthroscopy  10/15/05  . Colon surgery       Current Outpatient Prescriptions  Medication Sig Dispense Refill  . aspirin 81 MG tablet Take 81 mg by mouth daily.      Marland Kitchen ENSURE (ENSURE) Take 237 mLs by mouth as directed. 1-2 cans a day    . Escitalopram Oxalate (LEXAPRO PO) Take by mouth daily.     . Ferrous  Sulfate (SLOW FE PO) Take by mouth daily.      Marland Kitchen levothyroxine (SYNTHROID, LEVOTHROID) 75 MCG tablet Take 88 mcg by mouth daily before breakfast.     . traMADol (ULTRAM) 50 MG tablet TK 1 T PO  Q 4 TO 6 H PRF PAIN  0  . Vitamin D, Ergocalciferol, (DRISDOL) 50000 UNITS CAPS 50,000 Units by Per NG tube route every 7 (seven) days.      No current facility-administered medications for this visit.    Allergies:   Penicillins    Social History:  The patient  reports that she has never smoked. She does not have any smokeless tobacco history on file. She reports that she does not use illicit drugs.   Family History:  The patient's family history includes Hypertension in her brother and mother. There is no history of Heart attack or Stroke.    ROS:  Please see the history of present illness.   Otherwise, review of systems are positive for none.   All other systems are reviewed and negative.    PHYSICAL EXAM: VS:  BP 126/75 mmHg  Pulse 127  Ht 5\' 5"  (1.651 m)  Wt 114 lb (51.71 kg)  BMI 18.97 kg/m2 , BMI Body mass index is 18.97 kg/(m^2). GEN: Well nourished, well developed, in no acute distress Neck: no  JVD, HJR, carotid bruits, or masses Cardiac: RRR; no murmurs,gallop, rubs, thrill or heave,  Respiratory:  clear to auscultation bilaterally, normal work of breathing GI: soft, nontender, nondistended, + BS MS: no deformity or atrophy Extremities: without cyanosis, clubbing, edema, good distal pulses bilaterally.  Skin: warm and dry, no rash Neuro:  Strength and sensation are intact    EKG:  EKG is ordered today. The ekg ordered today demonstrates sinus tachycardia at 126 bpm   Recent Labs: 03/19/2015: ALT 25; BUN 33*; Creatinine, Ser 1.48*; Hemoglobin 10.5*; Platelets 390; Potassium 4.6; Sodium 136; TSH 2.448    Lipid Panel    Component Value Date/Time   CHOL 142 01/06/2014 1040   TRIG 58.0 01/06/2014 1040   HDL 50.10 01/06/2014 1040   CHOLHDL 3 01/06/2014 1040   VLDL 11.6  01/06/2014 1040   LDLCALC 80 01/06/2014 1040      Wt Readings from Last 3 Encounters:  04/09/15 114 lb (51.71 kg)  03/26/15 116 lb (52.617 kg)  10/24/14 127 lb (57.607 kg)      Other studies Reviewed: Additional studies/ records that were reviewed today include and review of the records demonstrates:     ASSESSMENT AND PLAN: Orthostatic hypotension Patient is still low orthostatic but not dizzy. Her main problem is tachycardia. She could not stand to get a standing reading because of knee pain. She is still not eating properly because of constipation. She is drinking better. Need to restart low-dose metoprolol for the tachycardia. Follow-up closely.  Sinus tachycardia Resume low-dose atenolol 25 mg daily. Call for any dizziness. Follow-up with Dr. Mare Ferrari in 1-2 weeks. Home health to check.     Sumner Boast, PA-C  04/09/2015 10:56 AM    [facility] [address], [place], [place]  [address] Phone: [phone]; Fax: [phone]

## 2015-04-09 NOTE — Assessment & Plan Note (Addendum)
Patient is still low orthostatic but not dizzy. Her main problem is tachycardia. She could not stand to get a standing reading because of knee pain. She is still not eating properly because of constipation. She is drinking better. Need to restart low-dose metoprolol for the tachycardia. Follow-up closely.

## 2015-04-09 NOTE — Assessment & Plan Note (Signed)
Patient's weight is 114 pounds today. She is down another 2 pounds. She is not eating because of constipation. She really needs to start eating or her weight loss will continue. She is drinking better. She is taking a stool softener.

## 2015-04-09 NOTE — Assessment & Plan Note (Signed)
Resume low-dose atenolol 25 mg daily. Call for any dizziness. Follow-up with Dr. Mare Ferrari in 1-2 weeks. Home health to check.

## 2015-04-09 NOTE — Patient Instructions (Addendum)
Medication Instructions:  Your physician has recommended you make the following change in your medication:  1) Restart Atenolol 25mg  daily--- take 1/2 of a 50 mg tablet daily   Labwork: Your physician recommends that you return for lab work today:BMP   Testing/Procedures: None ordered  Follow-Up: Your physician recommends that you schedule a follow-up appointment in: 2 weeks with Dr Mare Ferrari   Any Other Special Instructions Will Be Listed Below (If Applicable).  CALL office with any dizziness

## 2015-04-24 ENCOUNTER — Ambulatory Visit (INDEPENDENT_AMBULATORY_CARE_PROVIDER_SITE_OTHER): Payer: Medicare PPO | Admitting: Cardiology

## 2015-04-24 ENCOUNTER — Encounter: Payer: Self-pay | Admitting: Cardiology

## 2015-04-24 VITALS — BP 108/38 | HR 91 | Ht 65.0 in | Wt 115.8 lb

## 2015-04-24 DIAGNOSIS — I119 Hypertensive heart disease without heart failure: Secondary | ICD-10-CM

## 2015-04-24 DIAGNOSIS — I471 Supraventricular tachycardia: Secondary | ICD-10-CM | POA: Diagnosis not present

## 2015-04-24 DIAGNOSIS — R634 Abnormal weight loss: Secondary | ICD-10-CM | POA: Diagnosis not present

## 2015-04-24 DIAGNOSIS — R000 Tachycardia, unspecified: Secondary | ICD-10-CM

## 2015-04-24 NOTE — Progress Notes (Signed)
Cardiology Office Note   Date:  04/24/2015   ID:  Ahlani, Wickes [birth year], MRN [id number]  PCP:  Warren Danes, MD  Cardiologist: Darlin Coco MD  No chief complaint on file.     History of Present Illness: Vanessa Clayton is a 81 y.o. female who presents for scheduled follow-up office visit. This pleasant 81 year old woman is seen for a scheduled 6 month followup office visit. She has a past history of essential hypertension and history of hypercholesterolemia. She has a remote history of thyrotoxicosis. She's also had a past history of breast cancer of the left breast.  For the last year she has been having gradual persistent weight loss.At her last office visit because of weight loss we got a chest x-ray which was unremarkable. She has continued to lose weight.  Her appetite is only fair. She attributes her weight loss and her poor appetite to the fact that she is under a lot of stress caring for her husband who has symptoms of Alzheimer's and dementia. She states that her PCP has done a lot of blood tests and no cause otherwise for her weight loss has been found. Her shortness of breath is unchanged. She had an echocardiogram in 07/04/13 showing an ejection fraction of 55-60% with grade 1 diastolic dysfunction.  Her recent lab work has been stable.  No organic cause for the weight loss has been found.  Actually her weight is up 1 pound since last checked.  Her granddaughter has been bringing in food to her and she is also drinking one can of boost every day which has helped. 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 HTN (hypertension)   . Breast cancer     left  . Hypothyroidism     following treatment for hyperthyroidism  . Hyperthyroidism   . Chronic anemia   . Vitamin D deficiency   . Chronic anxiety     Past Surgical History    Procedure Laterality Date  . Breast lumpectomy  1998    left with radiation  . Knee arthroscopy  10/15/05  . Colon surgery       Current Outpatient Prescriptions  Medication Sig Dispense Refill  . aspirin 81 MG tablet Take 81 mg by mouth daily.      Marland Kitchen atenolol (TENORMIN) 50 MG tablet Take 0.5 tablets (25 mg total) by mouth daily. 45 tablet 3  . Escitalopram Oxalate (LEXAPRO PO) Take 1 tablet by mouth daily. Pt not sure of how many mg    . feeding supplement (BOOST HIGH PROTEIN) LIQD Take 1 Container by mouth 2 (two) times daily.    Marland Kitchen levothyroxine (SYNTHROID, LEVOTHROID) 75 MCG tablet Take 75 mcg by mouth daily before breakfast.     . traMADol (ULTRAM) 50 MG tablet take one tab every 4 to 6 hrs as needed for pain  0  . Vitamin D, Ergocalciferol, (DRISDOL) 50000 UNITS CAPS 50,000 Units by Per NG tube route every 14 (fourteen) days.     . Ferrous Sulfate (SLOW FE PO) Take by mouth daily.       No current facility-administered medications for this visit.    Allergies:   Penicillins    Social History:  The patient  reports that she has never smoked. She does not have any smokeless tobacco history on file. She reports that she  does not use illicit drugs.   Family History:  The patient's family history includes Hypertension in her brother and mother. There is no history of Heart attack or Stroke.    ROS:  Please see the history of present illness.   Otherwise, review of systems are positive for none.   All other systems are reviewed and negative.    PHYSICAL EXAM: VS:  BP 108/38 mmHg  Pulse 91  Ht 5\' 5"  (1.651 m)  Wt 115 lb 12.8 oz (52.527 kg)  BMI 19.27 kg/m2 , BMI Body mass index is 19.27 kg/(m^2). GEN: Well nourished, well developed, in no acute distress HEENT: normal Neck: no JVD, carotid bruits, or masses Cardiac: RRR; no murmurs, rubs, or gallops,no edema  Respiratory:  clear to auscultation bilaterally, normal work of breathing GI: soft, nontender, nondistended, +  BS MS: no deformity or atrophy Skin: warm and dry, no rash Neuro:  Strength and sensation are intact Psych: euthymic mood, full affect   EKG:  EKG is ordered today.    Recent Labs: 03/19/2015: ALT 25; Hemoglobin 10.5*; Platelets 390; TSH 2.448 04/09/2015: BUN 19; Creatinine, Ser 1.17; Potassium 3.9; Sodium 132*    Lipid Panel    Component Value Date/Time   CHOL 142 01/06/2014 1040   TRIG 58.0 01/06/2014 1040   HDL 50.10 01/06/2014 1040   CHOLHDL 3 01/06/2014 1040   VLDL 11.6 01/06/2014 1040   LDLCALC 80 01/06/2014 1040      Wt Readings from Last 3 Encounters:  04/24/15 115 lb 12.8 oz (52.527 kg)  04/09/15 114 lb (51.71 kg)  03/26/15 116 lb (52.617 kg)        ASSESSMENT AND PLAN:  1. Unintentional weight loss, probably secondary to emotional stress from looking after her husband with dementia.  The patient has good family support in town with a granddaughter who lives nearby. 2. history of hypercholesterolemia 3. diastolic dysfunction by echocardiogram 07/04/13 4. history of breast cancer 5. prior history of hyperthyroidism, followed by Dr. Elyse Hsu   Current medicines are reviewed at length with the patient today.  The patient does not have concerns regarding medicines.  The following changes have been made:  no change  Labs/ tests ordered today include:  No orders of the defined types were placed in this encounter.   Disposition: Continue current medication.  Recheck in 4 months for office visit.  Continue to increase caloric intake with the addition of boost  Signed, Darlin Coco MD 04/24/2015 1:20 PM    [address], [place], [place]  [address] Phone: [phone]; Fax: [phone]

## 2015-04-24 NOTE — Patient Instructions (Signed)
Medication Instructions:  Your physician recommends that you continue on your current medications as directed. Please refer to the Current Medication list given to you today.  Labwork: none  Testing/Procedures: none  Follow-Up: Your physician recommends that you schedule a follow-up appointment in: 4 month ov   

## 2015-05-07 ENCOUNTER — Encounter (HOSPITAL_COMMUNITY): Payer: Self-pay | Admitting: *Deleted

## 2015-05-07 ENCOUNTER — Emergency Department (HOSPITAL_COMMUNITY): Payer: Medicare PPO

## 2015-05-07 ENCOUNTER — Observation Stay (HOSPITAL_COMMUNITY)
Admission: EM | Admit: 2015-05-07 | Discharge: 2015-05-10 | Disposition: A | Discharge status: Home / Self Care | Payer: Medicare PPO | Attending: Internal Medicine | Admitting: Internal Medicine

## 2015-05-07 DIAGNOSIS — E43 Unspecified severe protein-calorie malnutrition: Secondary | ICD-10-CM | POA: Insufficient documentation

## 2015-05-07 DIAGNOSIS — I129 Hypertensive chronic kidney disease with stage 1 through stage 4 chronic kidney disease, or unspecified chronic kidney disease: Secondary | ICD-10-CM | POA: Diagnosis not present

## 2015-05-07 DIAGNOSIS — I10 Essential (primary) hypertension: Secondary | ICD-10-CM | POA: Diagnosis not present

## 2015-05-07 DIAGNOSIS — Z853 Personal history of malignant neoplasm of breast: Secondary | ICD-10-CM | POA: Insufficient documentation

## 2015-05-07 DIAGNOSIS — G459 Transient cerebral ischemic attack, unspecified: Secondary | ICD-10-CM | POA: Diagnosis present

## 2015-05-07 DIAGNOSIS — G451 Carotid artery syndrome (hemispheric): Secondary | ICD-10-CM | POA: Diagnosis not present

## 2015-05-07 DIAGNOSIS — N183 Chronic kidney disease, stage 3 (moderate): Secondary | ICD-10-CM | POA: Diagnosis not present

## 2015-05-07 DIAGNOSIS — R29810 Facial weakness: Secondary | ICD-10-CM | POA: Diagnosis not present

## 2015-05-07 DIAGNOSIS — E871 Hypo-osmolality and hyponatremia: Secondary | ICD-10-CM | POA: Diagnosis not present

## 2015-05-07 DIAGNOSIS — Z7982 Long term (current) use of aspirin: Secondary | ICD-10-CM | POA: Insufficient documentation

## 2015-05-07 DIAGNOSIS — Z880 Allergy status to penicillin: Secondary | ICD-10-CM | POA: Diagnosis not present

## 2015-05-07 DIAGNOSIS — R4701 Aphasia: Secondary | ICD-10-CM | POA: Diagnosis present

## 2015-05-07 DIAGNOSIS — D649 Anemia, unspecified: Secondary | ICD-10-CM | POA: Insufficient documentation

## 2015-05-07 DIAGNOSIS — Z923 Personal history of irradiation: Secondary | ICD-10-CM | POA: Insufficient documentation

## 2015-05-07 DIAGNOSIS — E039 Hypothyroidism, unspecified: Secondary | ICD-10-CM | POA: Insufficient documentation

## 2015-05-07 DIAGNOSIS — E785 Hyperlipidemia, unspecified: Secondary | ICD-10-CM | POA: Insufficient documentation

## 2015-05-07 DIAGNOSIS — Z681 Body mass index (BMI) 19 or less, adult: Secondary | ICD-10-CM | POA: Diagnosis not present

## 2015-05-07 HISTORY — DX: Transient cerebral ischemic attack, unspecified: G45.9

## 2015-05-07 LAB — I-STAT CHEM 8, ED
BUN: 30 mg/dL — ABNORMAL HIGH (ref 6–20)
CHLORIDE: 100 mmol/L — AB (ref 101–111)
CREATININE: 1.5 mg/dL — AB (ref 0.44–1.00)
Calcium, Ion: 1.13 mmol/L (ref 1.13–1.30)
Glucose, Bld: 89 mg/dL (ref 65–99)
HCT: 38 % (ref 36.0–46.0)
Hemoglobin: 12.9 g/dL (ref 12.0–15.0)
POTASSIUM: 4.3 mmol/L (ref 3.5–5.1)
Sodium: 132 mmol/L — ABNORMAL LOW (ref 135–145)
TCO2: 22 mmol/L (ref 0–100)

## 2015-05-07 LAB — URINE MICROSCOPIC-ADD ON

## 2015-05-07 LAB — COMPREHENSIVE METABOLIC PANEL
ALT: 7 U/L — AB (ref 14–54)
AST: 17 U/L (ref 15–41)
Albumin: 3.6 g/dL (ref 3.5–5.0)
Alkaline Phosphatase: 47 U/L (ref 38–126)
Anion gap: 10 (ref 5–15)
BUN: 27 mg/dL — ABNORMAL HIGH (ref 6–20)
CALCIUM: 9.3 mg/dL (ref 8.9–10.3)
CO2: 22 mmol/L (ref 22–32)
Chloride: 100 mmol/L — ABNORMAL LOW (ref 101–111)
Creatinine, Ser: 1.42 mg/dL — ABNORMAL HIGH (ref 0.44–1.00)
GFR calc non Af Amer: 32 mL/min — ABNORMAL LOW (ref >60)
GFR, EST AFRICAN AMERICAN: 38 mL/min — AB (ref >60)
Glucose, Bld: 92 mg/dL (ref 65–99)
Potassium: 4.4 mmol/L (ref 3.5–5.1)
SODIUM: 132 mmol/L — AB (ref 135–145)
Total Bilirubin: 0.5 mg/dL (ref 0.3–1.2)
Total Protein: 7 g/dL (ref 6.5–8.1)

## 2015-05-07 LAB — CBC
HEMATOCRIT: 31 % — AB (ref 36.0–46.0)
Hemoglobin: 10.2 g/dL — ABNORMAL LOW (ref 12.0–15.0)
MCH: 24.7 pg — ABNORMAL LOW (ref 26.0–34.0)
MCHC: 32.9 g/dL (ref 30.0–36.0)
MCV: 75.1 fL — ABNORMAL LOW (ref 78.0–100.0)
PLATELETS: 286 10*3/uL (ref 150–400)
RBC: 4.13 MIL/uL (ref 3.87–5.11)
RDW: 14.8 % (ref 11.5–15.5)
WBC: 8.3 10*3/uL (ref 4.0–10.5)

## 2015-05-07 LAB — I-STAT TROPONIN, ED: Troponin i, poc: 0 ng/mL (ref 0.00–0.08)

## 2015-05-07 LAB — DIFFERENTIAL
Basophils Absolute: 0 10*3/uL (ref 0.0–0.1)
Basophils Relative: 0 % (ref 0–1)
Eosinophils Absolute: 0.1 10*3/uL (ref 0.0–0.7)
Eosinophils Relative: 1 % (ref 0–5)
LYMPHS PCT: 48 % — AB (ref 12–46)
Lymphs Abs: 4 10*3/uL (ref 0.7–4.0)
MONO ABS: 0.6 10*3/uL (ref 0.1–1.0)
MONOS PCT: 7 % (ref 3–12)
NEUTROS ABS: 3.6 10*3/uL (ref 1.7–7.7)
Neutrophils Relative %: 44 % (ref 43–77)

## 2015-05-07 LAB — URINALYSIS, ROUTINE W REFLEX MICROSCOPIC
GLUCOSE, UA: NEGATIVE mg/dL
Hgb urine dipstick: NEGATIVE
Ketones, ur: 15 mg/dL — AB
Nitrite: NEGATIVE
PH: 5 (ref 5.0–8.0)
Protein, ur: NEGATIVE mg/dL
SPECIFIC GRAVITY, URINE: 1.028 (ref 1.005–1.030)
Urobilinogen, UA: 1 mg/dL (ref 0.0–1.0)

## 2015-05-07 LAB — PROTIME-INR
INR: 1.08 (ref 0.00–1.49)
Prothrombin Time: 14.2 seconds (ref 11.6–15.2)

## 2015-05-07 LAB — APTT: aPTT: 28 seconds (ref 24–37)

## 2015-05-07 LAB — CBG MONITORING, ED: GLUCOSE-CAPILLARY: 75 mg/dL (ref 65–99)

## 2015-05-07 MED ORDER — SENNOSIDES-DOCUSATE SODIUM 8.6-50 MG PO TABS: 1.0000 | ORAL_TABLET | Freq: Every evening | ORAL | Status: DC | PRN

## 2015-05-07 MED ORDER — BOOST HIGH PROTEIN PO LIQD
1.0000 | Freq: Two times a day (BID) | ORAL | Status: DC
  Administered 2015-05-08 (×2): 1 via ORAL
  Filled 2015-05-07 (×4): qty 237

## 2015-05-07 MED ORDER — ESCITALOPRAM OXALATE 10 MG PO TABS
10.0000 mg | ORAL_TABLET | Freq: Every day | ORAL | Status: DC
  Administered 2015-05-08 – 2015-05-10 (×3): 10 mg via ORAL
  Filled 2015-05-07 (×3): qty 1

## 2015-05-07 MED ORDER — SODIUM CHLORIDE 0.9 % IV SOLN
INTRAVENOUS | Status: DC
  Administered 2015-05-07: 23:00:00 via INTRAVENOUS

## 2015-05-07 MED ORDER — ATENOLOL 25 MG PO TABS
25.0000 mg | ORAL_TABLET | Freq: Every day | ORAL | Status: DC
  Administered 2015-05-08 – 2015-05-10 (×3): 25 mg via ORAL
  Filled 2015-05-07 (×3): qty 1

## 2015-05-07 MED ORDER — STROKE: EARLY STAGES OF RECOVERY BOOK
Freq: Once | Status: DC
  Filled 2015-05-07: qty 1

## 2015-05-07 MED ORDER — ASPIRIN 325 MG PO TABS
325.0000 mg | ORAL_TABLET | Freq: Every day | ORAL | Status: DC
  Administered 2015-05-08 – 2015-05-10 (×3): 325 mg via ORAL
  Filled 2015-05-07 (×3): qty 1

## 2015-05-07 MED ORDER — ENOXAPARIN SODIUM 30 MG/0.3ML ~~LOC~~ SOLN
30.0000 mg | Freq: Every day | SUBCUTANEOUS | Status: DC
  Administered 2015-05-08 – 2015-05-10 (×3): 30 mg via SUBCUTANEOUS
  Filled 2015-05-07 (×3): qty 0.3

## 2015-05-07 MED ORDER — ASPIRIN 300 MG RE SUPP: 300.0000 mg | Freq: Every day | RECTAL | Status: DC

## 2015-05-07 MED ORDER — FERROUS SULFATE 325 (65 FE) MG PO TABS
325.0000 mg | ORAL_TABLET | Freq: Three times a day (TID) | ORAL | Status: DC
  Administered 2015-05-08 – 2015-05-10 (×7): 325 mg via ORAL
  Filled 2015-05-07 (×7): qty 1

## 2015-05-07 MED ORDER — LEVOTHYROXINE SODIUM 25 MCG PO TABS
75.0000 ug | ORAL_TABLET | Freq: Every day | ORAL | Status: DC
Start: 2015-05-08 — End: 2015-05-10
  Administered 2015-05-08 – 2015-05-10 (×3): 75 ug via ORAL
  Filled 2015-05-07 (×8): qty 1

## 2015-05-07 NOTE — Progress Notes (Signed)
Pt arrived to unit alert and oriented. Oriented to room and questions answered. Bed alarm activated. Call bell at side. Will continue to monitor. Bobbye Charleston, RN

## 2015-05-07 NOTE — Consult Note (Signed)
Referring Physician: Oleta Mouse    Chief Complaint: Difficulty with speech, left facial droop  HPI: Vanessa Clayton is an 81 y.o. female who was at home with her daughter when she acutely noted that the patient was unable to write correctly. The patient then was unable to get her words out correctly.  EMS was called.  The patient was felt to be at baseline at their arrival but the patient was brought in for evaluation.  On presentation she was evaluated by the ED physician who felt that her speech was again altered.  Code stroke was called.  Initial NIHSS of 0.  Family and patient now feels that patient is at baseline.    Date last known well: Date: 05/07/2015 Time last known well: Time: 17:45 tPA Given: No: Resolution of symptoms  Past Medical History  Diagnosis Date  . HTN (hypertension)   . Breast cancer     left  . Hypothyroidism     following treatment for hyperthyroidism  . Hyperthyroidism   . Chronic anemia   . Vitamin D deficiency   . Chronic anxiety     Past Surgical History  Procedure Laterality Date  . Breast lumpectomy  1998    left with radiation  . Knee arthroscopy  10/15/05  . Colon surgery      Family History  Problem Relation Age of Onset  . Hypertension Mother   . Heart attack Neg Hx   . Stroke Neg Hx   . Hypertension Brother    Social History:  reports that she has never smoked. She does not have any smokeless tobacco history on file. She reports that she does not drink alcohol or use illicit drugs.  Allergies:  Allergies  Allergen Reactions  . Penicillins     Rash     Medications: I have reviewed the patient's current medications. Prior to Admission:  Prior to Admission medications   Medication Sig Start Date End Date Taking? Authorizing Provider  acetaminophen (TYLENOL) 500 MG tablet Take 1,000 mg by mouth every 6 (six) hours as needed for moderate pain.   Yes Historical Provider, MD  aspirin 81 MG tablet Take 81 mg by mouth daily.     Yes Historical  Provider, MD  Aspirin-Caffeine 500-32.5 MG TABS Take 1 tablet by mouth daily as needed (for pain).   Yes Historical Provider, MD  atenolol (TENORMIN) 50 MG tablet Take 0.5 tablets (25 mg total) by mouth daily. 04/09/15  Yes Imogene Burn, PA-C  escitalopram (LEXAPRO) 10 MG tablet Take 10 mg by mouth daily.   Yes Historical Provider, MD  feeding supplement (BOOST HIGH PROTEIN) LIQD Take 1 Container by mouth 2 (two) times daily.   Yes Historical Provider, MD  levothyroxine (SYNTHROID, LEVOTHROID) 75 MCG tablet Take 75 mcg by mouth daily before breakfast.    Yes Historical Provider, MD  naproxen (NAPROSYN) 500 MG tablet Take 500 mg by mouth 2 (two) times daily as needed for moderate pain (or swelling).  05/03/15  Yes Historical Provider, MD  Ferrous Sulfate (SLOW FE PO) Take 1 tablet by mouth daily.     Historical Provider, MD  traMADol (ULTRAM) 50 MG tablet take one tab every 4 to 6 hrs as needed for pain 02/08/15   Historical Provider, MD  Vitamin D, Ergocalciferol, (DRISDOL) 50000 UNITS CAPS 50,000 Units by Per NG tube route every 14 (fourteen) days.  06/21/11   Historical Provider, MD    ROS: History obtained from the patient  General ROS: negative for -  chills, fatigue, fever, night sweats, weight gain or weight loss Psychological ROS: negative for - behavioral disorder, hallucinations, memory difficulties, mood swings or suicidal ideation Ophthalmic ROS: negative for - blurry vision, double vision, eye pain or loss of vision ENT ROS: negative for - epistaxis, nasal discharge, oral lesions, sore throat, tinnitus or vertigo Allergy and Immunology ROS: negative for - hives or itchy/watery eyes Hematological and Lymphatic ROS: negative for - bleeding problems, bruising or swollen lymph nodes Endocrine ROS: negative for - galactorrhea, hair pattern changes, polydipsia/polyuria or temperature intolerance Respiratory ROS: negative for - cough, hemoptysis, shortness of breath or wheezing Cardiovascular  ROS: negative for - chest pain, dyspnea on exertion, edema or irregular heartbeat Gastrointestinal ROS: negative for - abdominal pain, diarrhea, hematemesis, nausea/vomiting or stool incontinence Genito-Urinary ROS: negative for - dysuria, hematuria, incontinence or urinary frequency/urgency Musculoskeletal ROS: negative for - joint swelling or muscular weakness Neurological ROS: as noted in HPI Dermatological ROS: negative for rash and skin lesion changes  Physical Examination: Temperature 97.9 F (36.6 C), temperature source Oral, height 5\' 7"  (1.702 m), weight 54.432 kg (120 lb), SpO2 99 %.  HEENT-  Normocephalic, no lesions, without obvious abnormality.  Normal external eye and conjunctiva.  Normal TM's bilaterally.  Normal auditory canals and external ears. Normal external nose, mucus membranes and septum.  Normal pharynx. Cardiovascular- S1, S2 normal, pulses palpable throughout   Lungs- chest clear, no wheezing, rales, normal symmetric air entry Abdomen- soft, non-tender; bowel sounds normal; no masses,  no organomegaly Extremities- no edema Lymph-no adenopathy palpable Musculoskeletal-no joint tenderness, deformity or swelling Skin-warm and dry, no hyperpigmentation, vitiligo, or suspicious lesions  Neurological Examination Mental Status: Alert, oriented, thought content appropriate.  Speech fluent without evidence of aphasia.  Able to follow 3 step commands without difficulty. Cranial Nerves: II: Discs flat bilaterally; Visual fields grossly normal, pupils equal, round, reactive to light and accommodation III,IV, VI: ptosis not present, extra-ocular motions intact bilaterally V,VII: decreased right NLF, facial light touch sensation normal bilaterally VIII: hearing normal bilaterally IX,X: gag reflex present XI: bilateral shoulder shrug XII: midline tongue extension Motor: Right : Upper extremity   5/5    Left:     Upper extremity   5/5  Lower extremity   5/5     Lower  extremity   5/5 Tone and bulk:normal tone throughout; no atrophy noted Sensory: Pinprick and light touch intact throughout, bilaterally Deep Tendon Reflexes: 2+ and symmetric throughout Plantars: Right: downgoing   Left: downgoing Cerebellar: normal finger-to-nose and normal heel-to-shin testing bilaterally  Laboratory Studies:  Basic Metabolic Panel:  Recent Labs Lab 05/07/15 1858  NA 132*  K 4.3  CL 100*  GLUCOSE 89  BUN 30*  CREATININE 1.50*    Liver Function Tests: No results for input(s): AST, ALT, ALKPHOS, BILITOT, PROT, ALBUMIN in the last 168 hours. No results for input(s): LIPASE, AMYLASE in the last 168 hours. No results for input(s): AMMONIA in the last 168 hours.  CBC:  Recent Labs Lab 05/07/15 1858  WBC 8.3  NEUTROABS 3.6  HGB 10.2*  12.9  HCT 31.0*  38.0  MCV 75.1*  PLT 286    Cardiac Enzymes: No results for input(s): CKTOTAL, CKMB, CKMBINDEX, TROPONINI in the last 168 hours.  BNP: Invalid input(s): POCBNP  CBG:  Recent Labs Lab 05/07/15 1851  GLUCAP 75    Microbiology: Results for orders placed or performed during the hospital encounter of 03/19/15  Urine culture     Status: None   Collection Time:  03/19/15  2:13 PM  Result Value Ref Range Status   Specimen Description URINE, RANDOM  Final   Special Requests NONE  Final   Culture   Final    MULTIPLE SPECIES PRESENT, SUGGEST RECOLLECTION IF CLINICALLY INDICATED   Report Status 03/21/2015 FINAL  Final    Coagulation Studies:  Recent Labs  05/07/15 1858  LABPROT 14.2  INR 1.08    Urinalysis: No results for input(s): COLORURINE, LABSPEC, PHURINE, GLUCOSEU, HGBUR, BILIRUBINUR, KETONESUR, PROTEINUR, UROBILINOGEN, NITRITE, LEUKOCYTESUR in the last 168 hours.  Invalid input(s): APPERANCEUR  Lipid Panel:    Component Value Date/Time   CHOL 142 01/06/2014 1040   TRIG 58.0 01/06/2014 1040   HDL 50.10 01/06/2014 1040   CHOLHDL 3 01/06/2014 1040   VLDL 11.6 01/06/2014 1040    LDLCALC 80 01/06/2014 1040    HgbA1C: No results found for: HGBA1C  Urine Drug Screen:  No results found for: LABOPIA, COCAINSCRNUR, LABBENZ, AMPHETMU, THCU, LABBARB  Alcohol Level: No results for input(s): ETH in the last 168 hours.  Other results: EKG: sinus tachycardia at 98 bpm.  Multiple PVC's.  Imaging: Ct Head Wo Contrast  05/07/2015   CLINICAL DATA:  81 year old female with left facial foci concern for stroke.  EXAM: CT HEAD WITHOUT CONTRAST  TECHNIQUE: Contiguous axial images were obtained from the base of the skull through the vertex without intravenous contrast.  COMPARISON:  None.  FINDINGS: The ventricles are dilated and the sulci are prominent compatible with age-related atrophy. Periventricular and deep white matter hypodensities represent chronic microvascular ischemic changes. There is no intracranial hemorrhage. No mass effect or midline shift identified.  The visualized paranasal sinuses and mastoid air cells are well aerated. The calvarium is intact.  IMPRESSION: No acute intracranial pathology.  Age-related atrophy and chronic microvascular ischemic disease.  If symptoms persist and there are no contraindications, MRI may provide better evaluation if clinically indicated.  These results were called by telephone at the time of interpretation on 05/07/2015 at 7:08 pm to Dr. Brantley Stage , who verbally acknowledged these results.   Electronically Signed   By: Anner Crete M.D.   On: 05/07/2015 19:09    Assessment: 81 y.o. female presenting after an episode of left facial droop and difficulty with speech.  Patient now at baseline.  Head CT personally reviewed and shows no acute changes.  TIA suspected.  Patient on ASA at home.  Further work up recommended.  Stroke Risk Factors - hypertension  Plan: 1. HgbA1c, fasting lipid panel 2. MRI, MRA  of the brain without contrast 3. PT consult, OT consult, Speech consult 4. Echocardiogram 5. Carotid dopplers 6. Prophylactic  therapy-Antiplatelet med: Aspirin - dose 325mg  daily 7. NPO until RN stroke swallow screen 8. Telemetry monitoring 9. Frequent neuro checks    Alexis Goodell, MD [facility] [phone] 05/07/2015, 7:18 PM

## 2015-05-07 NOTE — ED Notes (Signed)
Attempted to call report

## 2015-05-07 NOTE — ED Notes (Signed)
Neurology and stroke team at bedside.

## 2015-05-07 NOTE — ED Provider Notes (Signed)
CSN: [id number]     Arrival date & time 05/07/15  1820 History   None    Chief Complaint  Patient presents with  . Aphasia  . Code Stroke     (Consider location/radiation/quality/duration/timing/severity/associated sxs/prior Treatment) HPI  81 year old female who presents with word finding difficulty. She has a history of hypertension, hyperlipidemia, hypothyroidism, and breast cancer status post lumpectomy and radiation, in remission. History is per provided by the patient's daughter, who has been living with this patient and been by her side all day long. At about 5:45 PM, the patient was writing a check, when her daughter noticed that she was having difficulty signing her name. Upon questioning the patient, she was unable to spell her name and was answering questions inappropriately. Her daughter also noticed that she had a slight drooping of the left side of her lip. Thus, EMS was called. On their arrival, her daughter noticed that the lip drooping had resolved and that she was able to form a more complete sentences. On arrival, patient continues to have signs of expressive aphasia, and stroke team was activated.  Daughter states that she has otherwise been in her usual state of health. Denies any recent fevers, chills, nausea, vomiting, chest pain, abdominal pain, difficulty breathing, cough or URI symptoms, or any urinary symptoms. She does not take any anticoagulants. She has not had any recent falls.   Past Medical History  Diagnosis Date  . HTN (hypertension)   . Breast cancer     left  . Hypothyroidism     following treatment for hyperthyroidism  . Hyperthyroidism   . Chronic anemia   . Vitamin D deficiency   . Chronic anxiety    Past Surgical History  Procedure Laterality Date  . Breast lumpectomy  1998    left with radiation  . Knee arthroscopy  10/15/05  . Colon surgery     Family History  Problem Relation Age of Onset  . Hypertension Mother   . Heart attack Neg Hx    . Stroke Neg Hx   . Hypertension Brother    Social History  Substance Use Topics  . Smoking status: Never Smoker   . Smokeless tobacco: None  . Alcohol Use: No   OB History    No data available     Review of Systems 10/14 systems reviewed and are negative other than those stated in the HPI    Allergies  Penicillins  Home Medications   Prior to Admission medications   Medication Sig Start Date End Date Taking? Authorizing Provider  acetaminophen (TYLENOL) 500 MG tablet Take 1,000 mg by mouth every 6 (six) hours as needed for moderate pain.   Yes Historical Provider, MD  aspirin 81 MG tablet Take 81 mg by mouth daily.     Yes Historical Provider, MD  Aspirin-Caffeine 500-32.5 MG TABS Take 1 tablet by mouth daily as needed (for pain).   Yes Historical Provider, MD  atenolol (TENORMIN) 50 MG tablet Take 0.5 tablets (25 mg total) by mouth daily. 04/09/15  Yes Imogene Burn, PA-C  escitalopram (LEXAPRO) 10 MG tablet Take 10 mg by mouth daily.   Yes Historical Provider, MD  feeding supplement (BOOST HIGH PROTEIN) LIQD Take 1 Container by mouth 2 (two) times daily.   Yes Historical Provider, MD  levothyroxine (SYNTHROID, LEVOTHROID) 75 MCG tablet Take 75 mcg by mouth daily before breakfast.    Yes Historical Provider, MD  lidocaine (LMX) 4 % cream Apply 1 application topically daily as  needed (for knee and back pain).   Yes Historical Provider, MD  lidocaine-hydrocortisone (ANAMANTEL HC) 3-0.5 % CREA Place 1 Applicatorful rectally.   Yes Historical Provider, MD  naproxen (NAPROSYN) 500 MG tablet Take 500 mg by mouth 2 (two) times daily as needed for moderate pain (or swelling).  05/03/15  Yes Historical Provider, MD  Ferrous Sulfate (SLOW FE PO) Take 1 tablet by mouth daily.     Historical Provider, MD  Vitamin D, Ergocalciferol, (DRISDOL) 50000 UNITS CAPS 50,000 Units by Per NG tube route every 14 (fourteen) days.  06/21/11   Historical Provider, MD   BP 144/73 mmHg  Pulse 98   Temp(Src) 97.6 F (36.4 C) (Axillary)  Resp 18  Ht 5\' 5"  (1.651 m)  Wt 114 lb 9.6 oz (51.982 kg)  BMI 19.07 kg/m2  SpO2 100% Physical Exam Physical Exam  Nursing note and vitals reviewed. Constitutional: Well developed, well nourished, non-toxic, and in no acute distress Head: Normocephalic and atraumatic.  Mouth/Throat: Oropharynx is clear and moist.  Neck: Normal range of motion. Neck supple.  Cardiovascular: Normal rate and regular rhythm. No edema.   Pulmonary/Chest: Effort normal and breath sounds normal.  Abdominal: Soft. There is no tenderness. There is no rebound and no guarding.  Musculoskeletal: Normal range of motion.  Skin: Skin is warm and dry.  Psychiatric: Cooperative Neurological:  Alert, oriented to person, place, time, and situation. Memory grossly in tact. Fluent speech. No dysarthria. Expressive aphasia noted.  Cranial nerves: VF are full.  Pupils are symmetric, and reactive to light. EOMI without nystagmus. No gaze deviation. Facial muscles symmetric with activation. Sensation to light touch over face in tact bilaterally. Hearing grossly in tact. Palate elevates symmetrically. Head turn and shoulder shrug are intact. Tongue midline.  Reflexes defered.  Muscle bulk and tone normal. No pronator drift. Moves all extremities symmetrically. Sensation to light touch is in tact throughout in bilateral upper and lower extremities. Coordination reveals no dysmetria with finger to nose. Gait is deferred.  ED Course  Procedures (including critical care time) Labs Review Labs Reviewed  CBC - Abnormal; Notable for the following:    Hemoglobin 10.2 (*)    HCT 31.0 (*)    MCV 75.1 (*)    MCH 24.7 (*)    All other components within normal limits  DIFFERENTIAL - Abnormal; Notable for the following:    Lymphocytes Relative 48 (*)    All other components within normal limits  COMPREHENSIVE METABOLIC PANEL - Abnormal; Notable for the following:    Sodium 132 (*)     Chloride 100 (*)    BUN 27 (*)    Creatinine, Ser 1.42 (*)    ALT 7 (*)    GFR calc non Af Amer 32 (*)    GFR calc Af Amer 38 (*)    All other components within normal limits  URINALYSIS, ROUTINE W REFLEX MICROSCOPIC (NOT AT [facility]) - Abnormal; Notable for the following:    Bilirubin Urine SMALL (*)    Ketones, ur 15 (*)    Leukocytes, UA MODERATE (*)    All other components within normal limits  URINE MICROSCOPIC-ADD ON - Abnormal; Notable for the following:    Squamous Epithelial / LPF MANY (*)    Bacteria, UA FEW (*)    Casts HYALINE CASTS (*)    All other components within normal limits  I-STAT CHEM 8, ED - Abnormal; Notable for the following:    Sodium 132 (*)    Chloride 100 (*)  BUN 30 (*)    Creatinine, Ser 1.50 (*)    All other components within normal limits  PROTIME-INR  APTT  HEMOGLOBIN A1C  COMPREHENSIVE METABOLIC PANEL  CBC  LIPID PANEL  I-STAT TROPOININ, ED  CBG MONITORING, ED    Imaging Review Ct Head Wo Contrast  05/07/2015   CLINICAL DATA:  81 year old female with left facial foci concern for stroke.  EXAM: CT HEAD WITHOUT CONTRAST  TECHNIQUE: Contiguous axial images were obtained from the base of the skull through the vertex without intravenous contrast.  COMPARISON:  None.  FINDINGS: The ventricles are dilated and the sulci are prominent compatible with age-related atrophy. Periventricular and deep white matter hypodensities represent chronic microvascular ischemic changes. There is no intracranial hemorrhage. No mass effect or midline shift identified.  The visualized paranasal sinuses and mastoid air cells are well aerated. The calvarium is intact.  IMPRESSION: No acute intracranial pathology.  Age-related atrophy and chronic microvascular ischemic disease.  If symptoms persist and there are no contraindications, MRI may provide better evaluation if clinically indicated.  These results were called by telephone at the time of interpretation on 05/07/2015 at 7:08  pm to Dr. Brantley Stage , who verbally acknowledged these results.   Electronically Signed   By: Anner Crete M.D.   On: 05/07/2015 19:09   I have personally reviewed and evaluated these images and lab results as part of my medical decision-making.   EKG Interpretation   Date/Time:  Monday May 07 2015 18:29:59 EDT Ventricular Rate:  98 PR Interval:  168 QRS Duration: 71 QT Interval:  340 QTC Calculation: 434 R Axis:   54 Text Interpretation:  Sinus tachycardia Multiple ventricular premature  complexes No significant change since last tracing Confirmed by Daleen Steinhaus MD,  Atiya Yera ([id number]) on 05/08/2015 2:02:36 AM      MDM   Final diagnoses:  Transient cerebral ischemia, unspecified transient cerebral ischemia type  Expressive aphasia    81 year old female who presents with expressive aphasia. On arrival, she is nontoxic and in no acute distress. She initially is noted to have some residual expressive aphasia, having difficulty finishing sentences, and with some inappropriate word usage. Remainder of neurological exam is intact. A code stroke was activated. CT head showed no acute intracranial processes. Point-of-care glucose was within normal limits. Blood work reviewed, and otherwise unremarkable. EKG shows sinus tachycardia with multiple PVCs. By time of neurology evaluation, patient was then noted to have resolution of all of her original deficits. Felt to be appropriate for a medicine admission. Admitted to the [facility] under telemetry for further TIA workup.  Forde Dandy, MD 05/08/15 [phone]

## 2015-05-07 NOTE — H&P (Signed)
[facility] History and Physical  [name] YQM:[id number] DOB: March 14, 1926 DOA: 05/07/2015  Referring physician: Dr. Oleta Mouse. PCP: Warren Danes, MD  Specialists:  Dr. Elyse Hsu. Endocrinologist.   Chief Complaint: Difficulty speaking and writing.  HPI: Vanessa Clayton is a 81 y.o. female  with history of hypertension, hypothyroidism, chronic anemia, chronic kidney disease was brought to the ER after patient was found to have brief episode of rapid patient was unable to speak and right. Patient's daughter also noticed that patient may have had brief episode of right facial droop and decreased strength in the right hand grip. The symptoms lasted for around 10 minutes and resolved. By the time patient reached ER patient was nonfocal. CT head did not show anything acute. On-call neurologist has been consulted and patient admitted for TIA workup. Patient denies any recent new medications. Patient says she did have some visual blurriness. Denies any difficulty swallowing or any weakness or other extremity other than the right hand grip strength decreasing.   Review of Systems: As presented in the history of presenting illness, rest negative.  Past Medical History  Diagnosis Date  . HTN (hypertension)   . Breast cancer     left  . Hypothyroidism     following treatment for hyperthyroidism  . Hyperthyroidism   . Chronic anemia   . Vitamin D deficiency   . Chronic anxiety    Past Surgical History  Procedure Laterality Date  . Breast lumpectomy  1998    left with radiation  . Knee arthroscopy  10/15/05  . Colon surgery     Social History:  reports that she has never smoked. She does not have any smokeless tobacco history on file. She reports that she does not drink alcohol or use illicit drugs. Where does patient live home.  patient participate in ADLs? Yes.  Allergies  Allergen Reactions  . Penicillins     Rash     Family History:  Family History  Problem Relation Age of  Onset  . Hypertension Mother   . Heart attack Neg Hx   . Stroke Neg Hx   . Hypertension Brother       Prior to Admission medications   Medication Sig Start Date End Date Taking? Authorizing Provider  acetaminophen (TYLENOL) 500 MG tablet Take 1,000 mg by mouth every 6 (six) hours as needed for moderate pain.   Yes Historical Provider, MD  aspirin 81 MG tablet Take 81 mg by mouth daily.     Yes Historical Provider, MD  Aspirin-Caffeine 500-32.5 MG TABS Take 1 tablet by mouth daily as needed (for pain).   Yes Historical Provider, MD  atenolol (TENORMIN) 50 MG tablet Take 0.5 tablets (25 mg total) by mouth daily. 04/09/15  Yes Imogene Burn, PA-C  escitalopram (LEXAPRO) 10 MG tablet Take 10 mg by mouth daily.   Yes Historical Provider, MD  feeding supplement (BOOST HIGH PROTEIN) LIQD Take 1 Container by mouth 2 (two) times daily.   Yes Historical Provider, MD  levothyroxine (SYNTHROID, LEVOTHROID) 75 MCG tablet Take 75 mcg by mouth daily before breakfast.    Yes Historical Provider, MD  lidocaine (LMX) 4 % cream Apply 1 application topically daily as needed (for knee and back pain).   Yes Historical Provider, MD  lidocaine-hydrocortisone (ANAMANTEL HC) 3-0.5 % CREA Place 1 Applicatorful rectally.   Yes Historical Provider, MD  naproxen (NAPROSYN) 500 MG tablet Take 500 mg by mouth 2 (two) times daily as needed for moderate pain (or swelling).  05/03/15  Yes Historical Provider, MD  Ferrous Sulfate (SLOW FE PO) Take 1 tablet by mouth daily.     Historical Provider, MD  Vitamin D, Ergocalciferol, (DRISDOL) 50000 UNITS CAPS 50,000 Units by Per NG tube route every 14 (fourteen) days.  06/21/11   Historical Provider, MD    Physical Exam: Filed Vitals:   05/07/15 2000 05/07/15 2030 05/07/15 2100 05/07/15 2130  BP: 153/99 132/87 146/78 162/98  Pulse:  88 95 86  Temp:      TempSrc:      Resp:  23 20 20   Height:      Weight:      SpO2:  100% 100% 100%     General:  Moderately built and poorly  nourished.  Eyes: Anicteric. No pallor.  ENT: No discharge from the ears eyes nose and mouth.  Neck: No mass felt. No JVD appreciated. No neck rigidity.  Cardiovascular: S1 and S2 heard.  Respiratory: No rhonchi or crepitations.  Abdomen: Soft nontender bowel sounds present.  Skin: No rash.  Musculoskeletal: No edema.  Psychiatric: Appears normal.  Neurologic: Alert awake oriented to time place and person. Moves all extremities 5 x 5. No facial asymmetry. Tongue is midline. PERRLA positive.  Labs on Admission:  Basic Metabolic Panel:  Recent Labs Lab 05/07/15 1858  NA 132*  132*  K 4.4  4.3  CL 100*  100*  CO2 22  GLUCOSE 92  89  BUN 27*  30*  CREATININE 1.42*  1.50*  CALCIUM 9.3   Liver Function Tests:  Recent Labs Lab 05/07/15 1858  AST 17  ALT 7*  ALKPHOS 47  BILITOT 0.5  PROT 7.0  ALBUMIN 3.6   No results for input(s): LIPASE, AMYLASE in the last 168 hours. No results for input(s): AMMONIA in the last 168 hours. CBC:  Recent Labs Lab 05/07/15 1858  WBC 8.3  NEUTROABS 3.6  HGB 10.2*  12.9  HCT 31.0*  38.0  MCV 75.1*  PLT 286   Cardiac Enzymes: No results for input(s): CKTOTAL, CKMB, CKMBINDEX, TROPONINI in the last 168 hours.  BNP (last 3 results) No results for input(s): BNP in the last 8760 hours.  ProBNP (last 3 results) No results for input(s): PROBNP in the last 8760 hours.  CBG:  Recent Labs Lab 05/07/15 1851  GLUCAP 75    Radiological Exams on Admission: Ct Head Wo Contrast  05/07/2015   CLINICAL DATA:  81 year old female with left facial foci concern for stroke.  EXAM: CT HEAD WITHOUT CONTRAST  TECHNIQUE: Contiguous axial images were obtained from the base of the skull through the vertex without intravenous contrast.  COMPARISON:  None.  FINDINGS: The ventricles are dilated and the sulci are prominent compatible with age-related atrophy. Periventricular and deep white matter hypodensities represent chronic  microvascular ischemic changes. There is no intracranial hemorrhage. No mass effect or midline shift identified.  The visualized paranasal sinuses and mastoid air cells are well aerated. The calvarium is intact.  IMPRESSION: No acute intracranial pathology.  Age-related atrophy and chronic microvascular ischemic disease.  If symptoms persist and there are no contraindications, MRI may provide better evaluation if clinically indicated.  These results were called by telephone at the time of interpretation on 05/07/2015 at 7:08 pm to Dr. Brantley Stage , who verbally acknowledged these results.   Electronically Signed   By: Anner Crete M.D.   On: 05/07/2015 19:09    EKG: Independently reviewed. Normal sinus rhythm.  Assessment/Plan Principal Problem:   TIA (  transient ischemic attack) Active Problems:   Hypothyroidism   Hypertension   1. TIA - appreciate neurology consult. Patient has been placed on swallow evaluation neuro checks. Check MRI/MRA brain 2-D echo carotid Doppler hemoglobin A1c and lipid panel. Aspirin. 2. Hypertension - continue present medications. Allow for permissive hypertension. 3. Hypothyroidism on Synthroid. 4. Chronic kidney disease stage III creatinine appears to be at baseline. 5. Chronic anemia follow CBC.  I have reviewed patient's old charts and labs.   DVT Prophylaxis Lovenox.  Code Status: Full code.  Family Communication: Discussed patient's granddaughter.  Disposition Plan: Admit for observation.    Kirklin Mcduffee N. [facility] Pager [phone].  If 7PM-7AM, please contact night-coverage [email or web address] Password [email or web address] 05/07/2015, 10:06 PM

## 2015-05-07 NOTE — Code Documentation (Signed)
Patient had difficulty finding words this afternoon as she was speaking with her family, as well as facial droop.  LKW at 1715.  Upon EMS arrival patient was asymptomatic.  NIHSS 0  Dr Doy Mince at bedside to assess patient.  Stat head CT and labs done.  TIA alert, neuro check q 2 hour

## 2015-05-07 NOTE — ED Notes (Signed)
Pt arrives from home via [facility]. Pt was writing a check and forgot her name, was unable to say her name or spell. The daughter informed [facility] the pt was saying random letter that made no sense and also had a left sided facial droop. Upon EMS arrival pt was at baseline with no deficits and no complaints.

## 2015-05-08 ENCOUNTER — Observation Stay (HOSPITAL_COMMUNITY): Payer: Medicare PPO

## 2015-05-08 ENCOUNTER — Other Ambulatory Visit: Payer: Self-pay | Admitting: Cardiology

## 2015-05-08 ENCOUNTER — Observation Stay (HOSPITAL_BASED_OUTPATIENT_CLINIC_OR_DEPARTMENT_OTHER): Payer: Medicare PPO

## 2015-05-08 DIAGNOSIS — E039 Hypothyroidism, unspecified: Secondary | ICD-10-CM

## 2015-05-08 DIAGNOSIS — E785 Hyperlipidemia, unspecified: Secondary | ICD-10-CM | POA: Diagnosis not present

## 2015-05-08 DIAGNOSIS — E43 Unspecified severe protein-calorie malnutrition: Secondary | ICD-10-CM | POA: Diagnosis not present

## 2015-05-08 DIAGNOSIS — I10 Essential (primary) hypertension: Secondary | ICD-10-CM

## 2015-05-08 DIAGNOSIS — G458 Other transient cerebral ischemic attacks and related syndromes: Secondary | ICD-10-CM

## 2015-05-08 DIAGNOSIS — G451 Carotid artery syndrome (hemispheric): Secondary | ICD-10-CM | POA: Diagnosis not present

## 2015-05-08 DIAGNOSIS — G459 Transient cerebral ischemic attack, unspecified: Secondary | ICD-10-CM | POA: Diagnosis not present

## 2015-05-08 LAB — COMPREHENSIVE METABOLIC PANEL
ALBUMIN: 3.5 g/dL (ref 3.5–5.0)
ALK PHOS: 49 U/L (ref 38–126)
ALT: 7 U/L — ABNORMAL LOW (ref 14–54)
ANION GAP: 9 (ref 5–15)
AST: 17 U/L (ref 15–41)
BUN: 22 mg/dL — AB (ref 6–20)
CALCIUM: 9.4 mg/dL (ref 8.9–10.3)
CO2: 24 mmol/L (ref 22–32)
Chloride: 101 mmol/L (ref 101–111)
Creatinine, Ser: 1.28 mg/dL — ABNORMAL HIGH (ref 0.44–1.00)
GFR calc Af Amer: 43 mL/min — ABNORMAL LOW (ref >60)
GFR calc non Af Amer: 37 mL/min — ABNORMAL LOW (ref >60)
GLUCOSE: 94 mg/dL (ref 65–99)
Potassium: 4.2 mmol/L (ref 3.5–5.1)
SODIUM: 134 mmol/L — AB (ref 135–145)
Total Bilirubin: 0.6 mg/dL (ref 0.3–1.2)
Total Protein: 7.4 g/dL (ref 6.5–8.1)

## 2015-05-08 LAB — CBC
HEMATOCRIT: 30.4 % — AB (ref 36.0–46.0)
HEMOGLOBIN: 9.9 g/dL — AB (ref 12.0–15.0)
MCH: 24.6 pg — AB (ref 26.0–34.0)
MCHC: 32.6 g/dL (ref 30.0–36.0)
MCV: 75.4 fL — ABNORMAL LOW (ref 78.0–100.0)
Platelets: 289 10*3/uL (ref 150–400)
RBC: 4.03 MIL/uL (ref 3.87–5.11)
RDW: 14.9 % (ref 11.5–15.5)
WBC: 6.7 10*3/uL (ref 4.0–10.5)

## 2015-05-08 LAB — LIPID PANEL
CHOL/HDL RATIO: 4.6 ratio
CHOLESTEROL: 187 mg/dL (ref 0–200)
HDL: 41 mg/dL (ref >40)
LDL Cholesterol: 129 mg/dL — ABNORMAL HIGH (ref 0–99)
TRIGLYCERIDES: 84 mg/dL (ref <150)
VLDL: 17 mg/dL (ref 0–40)

## 2015-05-08 MED ORDER — PRAVASTATIN SODIUM 20 MG PO TABS
20.0000 mg | ORAL_TABLET | Freq: Every day | ORAL | Status: DC
  Administered 2015-05-08: 20 mg via ORAL
  Filled 2015-05-08: qty 1

## 2015-05-08 MED ORDER — ENSURE ENLIVE PO LIQD
237.0000 mL | Freq: Two times a day (BID) | ORAL | Status: DC
  Administered 2015-05-09 – 2015-05-10 (×3): 237 mL via ORAL
  Filled 2015-05-08 (×6): qty 237

## 2015-05-08 NOTE — Progress Notes (Signed)
STROKE TEAM PROGRESS NOTE   SUBJECTIVE (INTERVAL HISTORY) Her daughter is at the bedside.  Overall she feels her condition is resolved. Pt and daughter recounted history with Dr. Erlinda Hong which is consistent with TIA episode of left MCA involvement.     OBJECTIVE Temp:  [97.2 F (36.2 C)-98.2 F (36.8 C)] 97.6 F (36.4 C) (09/06 0900) Pulse Rate:  [85-99] 99 (09/06 0900) Cardiac Rhythm:  [-] Normal sinus rhythm (09/06 1055) Resp:  [15-23] 18 (09/06 0900) BP: (128-162)/(70-99) 150/90 mmHg (09/06 0900) SpO2:  [96 %-100 %] 100 % (09/06 0900) Weight:  [51.982 kg (114 lb 9.6 oz)-54.432 kg (120 lb)] 51.982 kg (114 lb 9.6 oz) (09/05 2323)  CBC:   Recent Labs Lab 05/07/15 1858 05/08/15 0649  WBC 8.3 6.7  NEUTROABS 3.6  --   HGB 10.2*  12.9 9.9*  HCT 31.0*  38.0 30.4*  MCV 75.1* 75.4*  PLT 286 185    Basic Metabolic Panel:   Recent Labs Lab 05/07/15 1858 05/08/15 0649  NA 132*  132* 134*  K 4.4  4.3 4.2  CL 100*  100* 101  CO2 22 24  GLUCOSE 92  89 94  BUN 27*  30* 22*  CREATININE 1.42*  1.50* 1.28*  CALCIUM 9.3 9.4    Lipid Panel:     Component Value Date/Time   CHOL 187 05/08/2015 0649   TRIG 84 05/08/2015 0649   HDL 41 05/08/2015 0649   CHOLHDL 4.6 05/08/2015 0649   VLDL 17 05/08/2015 0649   LDLCALC 129* 05/08/2015 0649   HgbA1c: No results found for: HGBA1C Urine Drug Screen: No results found for: LABOPIA, COCAINSCRNUR, LABBENZ, AMPHETMU, THCU, LABBARB    IMAGING I have personally reviewed the radiological images below and agree with the radiology interpretations.  Ct Head Wo Contrast 05/07/2015   No acute intracranial pathology.  Age-related atrophy and chronic microvascular ischemic disease.    MRI HEAD  05/08/2015   1. Punctate focus of high signal intensity on axial DWI sequence within the right paramedian splenium. This finding is favored to be artifactual in nature, although possible tiny focus of acute ischemia is not entirely excluded. Correlation  with symptomatology and history recommended. 2. No other acute intracranial infarct or other process identified. 3. Generalized age-related cerebral atrophy with mild chronic small vessel ischemic disease.    MRA HEAD  05/08/2015   1. No proximal or large arterial branch occlusion within the intracranial circulation. 2. Short-segment moderate stenosis within the supraclinoid left ICA. 3. Mild to moderate short-segment stenosis of the proximal basilar artery. 4. No other significant or focal stenosis identified within the intracranial circulation.     2D echo - - Left ventricle: The cavity size was normal. Wall thickness was increased in a pattern of moderate LVH. Systolic function was normal. The estimated ejection fraction was in the range of 60% to 65%. Wall motion was normal; there were no regional wall motion abnormalities. Doppler parameters are consistent with abnormal left ventricular relaxation (grade 1 diastolic dysfunction). - Aortic valve: Trileaflet; mildly thickened, mildly calcified leaflets. - Tricuspid valve: There was moderate regurgitation. - Pulmonary arteries: Systolic pressure was mildly increased. PA peak pressure: 31 mm Hg (S). - Pericardium, extracardiac: A small pericardial effusion was identified along the right ventricular free wall. There was no evidence of hemodynamic compromise Impressions: - Prior echocardiogram from 2014 demonstrated trivial anterior/ RV free wall pericardial effusion which now has mildly increased. No cardiac source of emboli was indentified.  CUS - pending  EEG - This normal EEG is recorded in the waking state. There was no seizure or seizure predisposition recorded on this study.   PHYSICAL EXAM  Temp:  [97.2 F (36.2 C)-98.2 F (36.8 C)] 97.9 F (36.6 C) (09/06 1722) Pulse Rate:  [85-101] 101 (09/06 1722) Resp:  [15-23] 20 (09/06 1722) BP: (106-162)/(58-99) 106/58 mmHg (09/06 1722) SpO2:  [96 %-100 %] 100  % (09/06 1722) Weight:  [114 lb 9.6 oz (51.982 kg)] 114 lb 9.6 oz (51.982 kg) (09/05 2323)  General - Well nourished, well developed, in no apparent distress.  Ophthalmologic - Fundi not visualized due to noncooperation.  Cardiovascular - Regular rate and rhythm with no murmur.  Mental Status -  Level of arousal and orientation to time, place, and person were intact. Language including expression, naming, repetition, comprehension was assessed and found intact. Fund of Knowledge was assessed and was impaired.  Cranial Nerves II - XII - II - Visual field intact OU. III, IV, VI - Extraocular movements intact. V - Facial sensation intact bilaterally. VII - Facial movement intact bilaterally. VIII - Hearing & vestibular intact bilaterally. X - Palate elevates symmetrically. XI - Chin turning & shoulder shrug intact bilaterally. XII - Tongue protrusion intact.  Motor Strength - The patient's strength was normal in all extremities and pronator drift was absent.  Bulk was normal and fasciculations were absent.   Motor Tone - Muscle tone was assessed at the neck and appendages and was normal.  Reflexes - The patient's reflexes were 1+ in all extremities and she had no pathological reflexes.  Sensory - Light touch, temperature/pinprick were assessed and were symmetrical.    Coordination - The patient had normal movements in the hands and feet with no ataxia or dysmetria.  Tremor was absent.  Gait and Station - deferred for safety concerns.   ASSESSMENT/PLAN Vanessa Clayton is a 81 y.o. female with history of hypertension, hypothyroidism, chronic anemia, chronic kidney disease presenting with difficulty with speech, left facial droop, difficulty writing. She did not receive IV t-PA due to resolution of symptoms.   TIA with left MCA involvement most likely, less likely Seizure or complicated/neurlogic migraine Stroke:  Incidental R paramedian splenium infarct not related to presenting  symptoms secondary to small vessel disease source  Resultant  Neurologic deficits resolved   MRI  Possible R paramedian splenium infarct vs artifact (does not correlate with presenting symptoms)  MRA  No large vessel significant stenosis  Carotid Doppler  pending   2D Echo  unremarkable   EEG - normal EEG  LDL 129  HgbA1c pending  Lovenox 30 mg sq daily for VTE prophylaxis Diet Heart Room service appropriate?: Yes; Fluid consistency:: Thin  aspirin 81 mg orally every day prior to admission, now on aspirin 325 mg orally every day.  Patient counseled to be compliant with her antithrombotic medications  Ongoing aggressive stroke risk factor management  Therapy recommendations:  HH OT  Disposition:  pending   Essential Hypertension  Stable  Permissive hypertension (OK if < 220/120) but gradually normalize in 5-7 days  Hyperlipidemia  Home meds:  No statin  LDL 129, goal < 70  Put on pravastatin 20mg   Continue statin at discharge  Other Stroke Risk Factors  Advanced age  Other Active Problems  Hx L breast cancer  Hypothyroidism  Chronic anxiety  Hospital day # 1  Rosalin Hawking, MD PhD Stroke Neurology 05/08/2015 6:49 PM   To contact Stroke Continuity provider, please refer to [email or web address] After  hours, contact General Neurology

## 2015-05-08 NOTE — Progress Notes (Signed)
EEG Completed; Results Pending  

## 2015-05-08 NOTE — Progress Notes (Signed)
[facility] TEAM 12  PROGRESS NOTE  Vanessa Clayton UDJ:[id number] DOB: [birth year] DOA: 05/07/2015 PCP: Warren Danes, MD  Admit HPI / Brief Narrative: 81 y.o. female with history of hypertension, hypothyroidism, chronic anemia, and chronic kidney disease who was brought to the ER after patient was found to have brief episode during which she was unable to speak and or write. Patient's daughter also noticed that patient may have had brief episode of right facial droop and decreased strength in the right hand grip. The symptoms lasted for around 10 minutes and resolved. By the time patient reached ER patient was nonfocal. CT head did not show anything acute.   HPI/Subjective: Resting comfortably in bed enjoying lunch. All sx have resolved.  Denies cp, sob, n/v, or abdom pain.    Assessment/Plan:  R brain TIA vs Seizure vs complicated migraine Neurology following and directing care - awaiting results of usual testing   HTN permissive HTN for now - follow   HLD LDL 129 - statin started  Hypothyroidism Cont home synthroid dose  CKD Stage 3 Baseline crt appears to be ~1.2-1.4  Chronic anemia  Follow Hgb - check Fe studies to determine if Fe load indicated   +UA UA sample appears to be of poor quality (lots of squam epi) - follow for now and repeat if develops sx of UTI  Code Status: FULL Family Communication: spoke w/ daughter at bedside  Disposition Plan: tele   Consultants: Neurology   Procedures: TTE 9/6 -   Antibiotics: none  DVT prophylaxis: lovenox   Objective: Blood pressure 138/80, pulse 89, temperature 97.6 F (36.4 C), temperature source Core (Comment), resp. rate 18, height 5\' 5"  (1.651 m), weight 51.982 kg (114 lb 9.6 oz), SpO2 100 %. No intake or output data in the 24 hours ending 05/08/15 1159   Exam: General: No acute respiratory distress Lungs: Clear to auscultation bilaterally without wheezes or crackles Cardiovascular: Regular rate and rhythm  without murmur gallop or rub normal S1 and S2 Abdomen: Nontender, nondistended, soft, bowel sounds positive, no rebound, no ascites, no appreciable mass Extremities: No significant cyanosis, clubbing, or edema bilateral lower extremities  Data Reviewed: Basic Metabolic Panel:  Recent Labs Lab 05/07/15 1858 05/08/15 0649  NA 132*  132* 134*  K 4.4  4.3 4.2  CL 100*  100* 101  CO2 22 24  GLUCOSE 92  89 94  BUN 27*  30* 22*  CREATININE 1.42*  1.50* 1.28*  CALCIUM 9.3 9.4    CBC:  Recent Labs Lab 05/07/15 1858 05/08/15 0649  WBC 8.3 6.7  NEUTROABS 3.6  --   HGB 10.2*  12.9 9.9*  HCT 31.0*  38.0 30.4*  MCV 75.1* 75.4*  PLT 286 289    Liver Function Tests:  Recent Labs Lab 05/07/15 1858 05/08/15 0649  AST 17 17  ALT 7* 7*  ALKPHOS 47 49  BILITOT 0.5 0.6  PROT 7.0 7.4  ALBUMIN 3.6 3.5   Coags:  Recent Labs Lab 05/07/15 1858  INR 1.08    Recent Labs Lab 05/07/15 1858  APTT 28   CBG:  Recent Labs Lab 05/07/15 1851  GLUCAP 75    Studies:   Recent x-ray studies have been reviewed in detail by the Attending Physician  Scheduled Meds:  Scheduled Meds: .  stroke: mapping our early stages of recovery book   Does not apply Once  . aspirin  300 mg Rectal Daily   Or  . aspirin  325 mg Oral Daily  .  atenolol  25 mg Oral Daily  . enoxaparin (LOVENOX) injection  30 mg Subcutaneous Daily  . escitalopram  10 mg Oral Daily  . feeding supplement  1 Container Oral BID BM  . ferrous sulfate  325 mg Oral TID WC  . levothyroxine  75 mcg Oral QAC breakfast  . pravastatin  20 mg Oral q1800    Time spent on care of this patient: 35 mins   MCCLUNG,JEFFREY T , MD   [facility] Office  [phone] Pager - Text Page per Shea Evans as per below:  On-Call/Text Page:      [email or web address]      password [email or web address]  If 7PM-7AM, please contact night-coverage [email or web address] Password [email or web address] 05/08/2015, 11:59 AM   LOS: 1 day

## 2015-05-08 NOTE — Progress Notes (Signed)
Occupational Therapy Evaluation Patient Details Name: Vanessa Clayton MRN: [id number] DOB: 20-Nov-1925 Today's Date: 05/08/2015    History of Present Illness Pt is a 81 y.o. female with history of HTN, hypothyroidism, chronic anemia, chronic kidney disease was brought to the ER after patient was found to have brief episode of rapid patient was unable to speak and right.Patient says she did have some visual blurriness. Denies any difficulty swallowing or any weakness or other extremity other than the right hand grip strength decreasing.    Clinical Impression   Pt admitted with the above diagnoses and presents with below problem list. Pt will benefit from continued acute OT to address the below listed deficits and maximize independence with BADLs prior to d/c home. PTA pt was mostly at mod I level with ADLs, min guard for tub shower transfers. Pt presents with dominant RUE weakness, impaired balance, and decreased activity tolerance impacting level of assist with ADLs. Pt is currently min guard for LB ADLs and min A for tub shower transfers. Pt lives with spouse and reports she has multiple local family members who can assist. OT to continue to follow acutely.      Follow Up Recommendations  Supervision/Assistance - 24 hour;Home health OT    Equipment Recommendations  3 in 1 bedside comode;Tub/shower seat    Recommendations for Other Services PT consult     Precautions / Restrictions Precautions Precautions: Fall Restrictions Weight Bearing Restrictions: No      Mobility Bed Mobility Overal bed mobility: Modified Independent             General bed mobility comments: Extra time, used bed rails  Transfers Overall transfer level: Needs assistance Equipment used: Rolling walker (2 wheeled) Transfers: Sit to/from Stand Sit to Stand: Supervision;Min guard         General transfer comment: supervision from elevated seat surfaces    Balance Overall balance assessment: Needs  assistance Sitting-balance support: No upper extremity supported;Feet supported Sitting balance-Leahy Scale: Fair     Standing balance support: Bilateral upper extremity supported;During functional activity Standing balance-Leahy Scale: Poor Standing balance comment: rw during session, external support of sink; poor standing activity tolerance                            ADL Overall ADL's : Needs assistance/impaired Eating/Feeding: Set up;Sitting   Grooming: Oral care;Wash/dry hands;Min guard;Sitting;Standing Grooming Details (indicate cue type and reason): Pt ambulated to sink and stood about 2 minutes to prepare oral care items. Pt then requesting to sit down due to legs feeling weak. Pt reports she does typically sit to groom.  Upper Body Bathing: Set up;Sitting   Lower Body Bathing: Min guard;Sit to/from stand   Upper Body Dressing : Set up;Sitting   Lower Body Dressing: Min guard;Sit to/from stand   Toilet Transfer: Min guard;Ambulation;Regular Toilet;RW;Grab bars Toilet Transfer Details (indicate cue type and reason): Pt also completed transfer to 3n1 with improved balance performance, cues for technique. Toileting- Water quality scientist and Hygiene: Min guard;Sit to/from stand   Tub/ Shower Transfer: Minimal assistance;Tub transfer;Ambulation;Shower seat;Rolling walker   Functional mobility during ADLs: Min guard;Rolling walker General ADL Comments: Pt uses cane at baseline. No cane in room, utilized rw during session. Pt completed in-room ambulation, oral care, and toilet transfer as detailed above. Pt noted to sit soon after reaching sink. She does report that she typically sits to groom.      Vision Vision Assessment?: No apparent visual  deficits Additional Comments: Pt able to read from therapist name badge and words on board at distance with no difficulty, bluriness or diplopia.   Perception     Praxis      Pertinent Vitals/Pain Pain Assessment:  Faces Faces Pain Scale: Hurts a little bit Pain Location: B knees "I think I have arthritis" Pain Descriptors / Indicators: Aching Pain Intervention(s): Limited activity within patient's tolerance;Monitored during session     Hand Dominance Right   Extremity/Trunk Assessment Upper Extremity Assessment Upper Extremity Assessment: RUE deficits/detail;Generalized weakness RUE Deficits / Details: 4-/5 gross strength. Pt reported during MMT, "This (RUE) arm does seem weaker."   Lower Extremity Assessment Lower Extremity Assessment: Defer to PT evaluation       Communication Communication Communication: No difficulties   Cognition Arousal/Alertness: Awake/alert Behavior During Therapy: WFL for tasks assessed/performed Overall Cognitive Status: Within Functional Limits for tasks assessed       Memory: Decreased short-term memory             General Comments       Exercises       Shoulder Instructions      Home Living Family/patient expects to be discharged to:: Private residence Living Arrangements: Spouse/significant other Available Help at Discharge: Family Type of Home: House Home Access: Stairs to enter Technical brewer of Steps: 2 Entrance Stairs-Rails: Right;Left;Can reach both Home Layout: Two level;Bed/bath upstairs Alternate Level Stairs-Number of Steps: 14 - split, with landing   Bathroom Shower/Tub: Teacher, early years/pre: Standard     Home Equipment: Cane - single point   Additional Comments: Pt lives with spouse who has memory issues per pt. Daughter and grandchildren live in town and assist with shower transfers.       Prior Functioning/Environment Level of Independence: Needs assistance  Gait / Transfers Assistance Needed: used cane PTA ADL's / Homemaking Assistance Needed: daughter assist with tub shower transfer        OT Diagnosis: Generalized weakness;Paresis;Other (comment) (impaired balance, decreased standing  activity tolerance)   OT Problem List: Decreased strength;Decreased activity tolerance;Impaired balance (sitting and/or standing);Decreased coordination;Decreased knowledge of use of DME or AE;Decreased knowledge of precautions   OT Treatment/Interventions: Self-care/ADL training;Therapeutic exercise;Neuromuscular education;DME and/or AE instruction;Energy conservation;Therapeutic activities;Patient/family education;Balance training    OT Goals(Current goals can be found in the care plan section) Acute Rehab OT Goals Patient Stated Goal: home OT Goal Formulation: With patient Time For Goal Achievement: 05/15/15 Potential to Achieve Goals: Good ADL Goals Pt Will Perform Grooming: with modified independence;sitting Pt Will Perform Lower Body Bathing: with modified independence;with adaptive equipment;sit to/from stand Pt Will Perform Lower Body Dressing: with modified independence;with adaptive equipment;sit to/from stand Pt Will Transfer to Toilet: with modified independence;ambulating (3n1 over toilet) Pt Will Perform Toileting - Clothing Manipulation and hygiene: with modified independence;with adaptive equipment;sit to/from stand;sitting/lateral leans Pt Will Perform Tub/Shower Transfer: Tub transfer;with min guard assist;ambulating;shower seat;3 in 1;rolling walker Pt/caregiver will Perform Home Exercise Program: Increased strength;Right Upper extremity;With written HEP provided;With theraputty;With theraband  OT Frequency: Min 3X/week   Barriers to D/C:            Co-evaluation              End of Session Equipment Utilized During Treatment: Gait belt;Rolling walker  Activity Tolerance: Patient tolerated treatment well;Other (comment) (decreased standing activity tolerance) Patient left: in bed;with call bell/phone within reach;with bed alarm set   Time: 6195-0932 OT Time Calculation (min): 32 min Charges:  OT General Charges $OT  Visit: 1 Procedure OT  Evaluation $Initial OT Evaluation Tier I: 1 Procedure OT Treatments $Self Care/Home Management : 8-22 mins G-Codes: OT G-codes **NOT FOR INPATIENT CLASS** Functional Assessment Tool Used: clnical judgement Functional Limitation: Self care Self Care Current Status (B7628): At least 1 percent but less than 20 percent impaired, limited or restricted Self Care Goal Status (B1517): At least 1 percent but less than 20 percent impaired, limited or restricted  Hortencia Pilar 05/08/2015, 10:18 AM

## 2015-05-08 NOTE — Evaluation (Signed)
Speech Language Pathology Evaluation Patient Details Name: Vanessa Clayton MRN: [id number] DOB: 15-Feb-1926 Today's Date: 05/08/2015 Time: 3614-4315 SLP Time Calculation (min) (ACUTE ONLY): 26 min  Problem List:  Patient Active Problem List   Diagnosis Date Noted  . TIA (transient ischemic attack) 05/07/2015  . Hypertension 05/07/2015  . Sinus tachycardia 04/09/2015  . Orthostatic hypotension 03/26/2015  . Dyspnea on exertion 06/17/2013  . Asymptomatic PVCs 12/09/2012  . Weight loss, non-intentional 12/09/2012  . Benign hypertensive heart disease without heart failure 12/04/2010  . Hypothyroidism 12/04/2010  . Hypercholesterolemia 12/04/2010  . History of breast cancer 12/04/2010  . Depression 12/04/2010   Past Medical History:  Past Medical History  Diagnosis Date  . HTN (hypertension)   . Breast cancer     left  . Hypothyroidism     following treatment for hyperthyroidism  . Hyperthyroidism   . Chronic anemia   . Vitamin D deficiency   . Chronic anxiety    Past Surgical History:  Past Surgical History  Procedure Laterality Date  . Breast lumpectomy  1998    left with radiation  . Knee arthroscopy  10/15/05  . Colon surgery     HPI:  Pt is a 81 y.o. female with history of HTN, hypothyroidism, chronic anemia, chronic kidney disease was brought to the ER after patient was found to have brief episode of patient being unable to speak and right facial droop. MRI negative.    Assessment / Plan / Recommendation Clinical Impression  Pt presents with normal language and speech; cognition is marked by mild memory deficits, at baseline per dtr who was present for assessment.  No SLP needs identified.      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  Overall Cognitive Status: History of cognitive impairments - at baseline Arousal/Alertness: Awake/alert Orientation Level: Oriented X4 Attention: Selective Selective Attention: Appears intact Memory: Impaired Memory Impairment: Retrieval deficit Awareness: Appears intact Problem Solving: Appears intact Safety/Judgment: Appears intact    Comprehension  Auditory Comprehension Overall Auditory Comprehension: Appears within functional limits for tasks assessed Reading Comprehension Reading Status: Within funtional limits    Expression Expression Primary Mode of Expression: Verbal Verbal Expression Overall Verbal Expression: Appears within functional limits for tasks assessed Written Expression Dominant Hand: Right Written Expression: Not tested   Oral / Motor Oral Motor/Sensory Function Overall Oral Motor/Sensory Function: Appears within functional limits for tasks assessed Motor Speech Overall Motor Speech: Appears within functional limits for tasks assessed   GO Functional Assessment Tool Used: MOCA Functional Limitations: Memory Memory Current Status (Q0086): At least 1 percent but less than 20 percent impaired, limited or restricted Memory Goal Status (P6195): At least 1 percent but less than 20 percent impaired, limited or restricted Memory Discharge Status [phone]): At least 1 percent but less than 20 percent impaired, limited or restricted   Vanessa Clayton 05/08/2015, 4:23 PM

## 2015-05-08 NOTE — Evaluation (Signed)
Physical Therapy Evaluation Patient Details Name: Vanessa Clayton MRN: [id number] DOB: 06/19/1926 Today's Date: 05/08/2015   History of Present Illness  Pt is a 81 y.o. female with history of HTN, hypothyroidism, chronic anemia, chronic kidney disease was brought to the ER after patient was found to have brief episode of patient being unable to speak and right facial droop. Pt says she did have some visual blurriness. Reports weakness in right hand grip strength. Head CT, MRI unremarkable.  Clinical Impression  Patient presents with premorbid left sided weakness, decreased grip strength RUE, balance deficits and impaired activity tolerance impacting safe mobility. Recently, pt using SPC due to back/LLE pain. Pt with fall history. Will attempt SPC vs RW next session during ambulation to assist with balance/safety. Recommend initial supervision at home during mobility and HHPT. Will follow acutely to maximize independence and mobility prior to return home.     Follow Up Recommendations Home health PT;Supervision/Assistance - 24 hour    Equipment Recommendations  Other (comment) (TBD pending progress.)    Recommendations for Other Services OT consult     Precautions / Restrictions Precautions Precautions: Fall Restrictions Weight Bearing Restrictions: No      Mobility  Bed Mobility Overal bed mobility: Modified Independent             General bed mobility comments: Extra time, used bed rails  Transfers Overall transfer level: Needs assistance Equipment used: None Transfers: Sit to/from Stand Sit to Stand: Supervision         General transfer comment: Supervision for safety.   Ambulation/Gait Ambulation/Gait assistance: Min guard Ambulation Distance (Feet): 150 Feet Assistive device: None Gait Pattern/deviations: Step-through pattern;Decreased stride length;Drifts right/left   Gait velocity interpretation: <1.8 ft/sec, indicative of risk for recurrent falls General  Gait Details: Left knee instability noted - premorbid per family. Unsteady. Might benefit from [facility] vs RW. No overt LOB.  Stairs            Wheelchair Mobility    Modified Rankin (Stroke Patients Only) Modified Rankin (Stroke Patients Only) Pre-Morbid Rankin Score: Moderate disability Modified Rankin: Moderately severe disability     Balance Overall balance assessment: Needs assistance Sitting-balance support: Feet supported;No upper extremity supported Sitting balance-Leahy Scale: Fair Sitting balance - Comments: Able to reach outside BoS and adjust socks.   Standing balance support: During functional activity Standing balance-Leahy Scale: Fair Standing balance comment: rw during session, external support of sink; poor standing activity tolerance                             Pertinent Vitals/Pain Pain Assessment: No/denies pain Faces Pain Scale: Hurts a little bit Pain Location: B knees "I think I have arthritis" Pain Descriptors / Indicators: Aching Pain Intervention(s): Limited activity within patient's tolerance;Monitored during session    Vincent expects to be discharged to:: Private residence Living Arrangements: Spouse/significant other Available Help at Discharge: Family Type of Home: House Home Access: Stairs to enter Entrance Stairs-Rails: Right;Left;Can reach both Technical brewer of Steps: 2 Home Layout: Two level;Bed/bath upstairs Home Equipment: Cane - single point Additional Comments: Pt lives with spouse who has memory issues per pt. Daughter and grandchildren live in town and assist with shower transfers.     Prior Function Level of Independence: Needs assistance   Gait / Transfers Assistance Needed: used cane PTA only since back pain and LLE pain (a few weeks)  ADL's / Homemaking Assistance Needed: daughter assist with tub  shower transfer        Hand Dominance   Dominant Hand: Right    Extremity/Trunk  Assessment   Upper Extremity Assessment: Defer to OT evaluation RUE Deficits / Details: 4-/5 gross strength. Pt reported during MMT, "This (RUE) arm does seem weaker."         Lower Extremity Assessment: LLE deficits/detail   LLE Deficits / Details: Grossly limited strength ~3- -4/5 throughout. Pt and daughter report this as premorbid due to back issues.     Communication   Communication: No difficulties  Cognition Arousal/Alertness: Awake/alert Behavior During Therapy: WFL for tasks assessed/performed Overall Cognitive Status: Within Functional Limits for tasks assessed       Memory: Decreased short-term memory              General Comments General comments (skin integrity, edema, etc.): Daughter present during session.    Exercises        Assessment/Plan    PT Assessment Patient needs continued PT services  PT Diagnosis Difficulty walking;Generalized weakness   PT Problem List Decreased strength;Decreased activity tolerance;Decreased balance;Decreased mobility;Decreased knowledge of use of DME  PT Treatment Interventions Balance training;Gait training;Functional mobility training;Therapeutic activities;Therapeutic exercise;Patient/family education;Stair training;DME instruction;Neuromuscular re-education   PT Goals (Current goals can be found in the Care Plan section) Acute Rehab PT Goals Patient Stated Goal: to go home PT Goal Formulation: With patient Time For Goal Achievement: 05/22/15 Potential to Achieve Goals: Fair    Frequency Min 4X/week   Barriers to discharge Inaccessible home environment;Decreased caregiver support 1 flight of steps to get to bedroom, spouse with memory issues    Co-evaluation               End of Session Equipment Utilized During Treatment: Gait belt Activity Tolerance: Patient tolerated treatment well Patient left: in bed;with call bell/phone within reach;with family/visitor present;with bed alarm set Nurse  Communication: Mobility status    Functional Assessment Tool Used: Clinical judgment Functional Limitation: Mobility: Walking and moving around Mobility: Walking and Moving Around Current Status (N1700): At least 1 percent but less than 20 percent impaired, limited or restricted Mobility: Walking and Moving Around Goal Status [phone]): At least 1 percent but less than 20 percent impaired, limited or restricted    Time: 4967-5916 PT Time Calculation (min) (ACUTE ONLY): 20 min   Charges:   PT Evaluation $Initial PT Evaluation Tier I: 1 Procedure     PT G Codes:   PT G-Codes **NOT FOR INPATIENT CLASS** Functional Assessment Tool Used: Clinical judgment Functional Limitation: Mobility: Walking and moving around Mobility: Walking and Moving Around Current Status (B8466): At least 1 percent but less than 20 percent impaired, limited or restricted Mobility: Walking and Moving Around Goal Status [phone]): At least 1 percent but less than 20 percent impaired, limited or restricted    [place] 05/08/2015, 12:09 PM Wray Kearns, [place], DPT [phone]

## 2015-05-08 NOTE — Progress Notes (Signed)
Nutrition Follow-up  DOCUMENTATION CODES:   Severe malnutrition in context of acute illness/injury  INTERVENTION:   -D/c Boost Breeze po TID, each supplement provides 250 kcal and 9 grams of protein -Ensure Enlive po BID, each supplement provides 350 kcal and 20 grams of protein  NUTRITION DIAGNOSIS:   Malnutrition related to acute illness as evidenced by moderate depletion of body fat, moderate depletions of muscle mass, percent weight loss.  GOAL:   Patient will meet greater than or equal to 90% of their needs  MONITOR:   PO intake, Supplement acceptance, Labs, Weight trends, Skin, I & O's  REASON FOR ASSESSMENT:   Malnutrition Screening Tool    ASSESSMENT:   81 y.o. female with history of hypertension, hypothyroidism, chronic anemia, and chronic kidney disease who was brought to the ER after patient was found to have brief episode during which she was unable to speak and or write. Patient's daughter also noticed that patient may have had brief episode of right facial droop and decreased strength in the right hand grip. The symptoms lasted for around 10 minutes and resolved. By the time patient reached ER patient was nonfocal. CT head did not show anything acute.   Pt admitted with new TIA vs seizures vs migraine.   Spoke with pt and pt daughter at bedside. Both report a general decline in health over the past 2 months, after suffering a pinched nerve in her back which limited her mobility. Pt daughter reports prior to this pt was extremely active and independent for her age, however, now requires a cane and walker to get around. Pt reports she typically does not eat a lot, however, pt and pt daughter report decline in pt intake over the past 2 months. Observed lunch tray at bedside; pt consumed 100% of sweet potato and fruit cup, 50% of tea. Pt consumed about 25% of her pork sandwich. Pt daughter reports "this is the most she has eaten on a good while".   Pt and daughter  unsure of wt hx, but are certain she has lost weight. Wt hx reveals wt stability over the past 2 months. Noted a 10.2% wt loss over the past 6 months.    Nutrition-Focused physical exam completed. Findings are mild to moderate fat depletion, mild to moderate muscle depletion, and no edema.   Pt reports she drank 1-2 Boost supplements daily PTA in order to optimize nutritional intake. Pt tried [employer] supplement, but expressed interest in trying Ensure for increased nutrient provision. RD encouraged continued use of supplements at home.   Labs reviewed.  Diet Order:  Diet Heart Room service appropriate?: Yes; Fluid consistency:: Thin  Skin:  Reviewed, no issues  Last BM:  05/07/15  Height:   Ht Readings from Last 1 Encounters:  05/07/15 5\' 5"  (1.651 m)    Weight:   Wt Readings from Last 1 Encounters:  05/07/15 114 lb 9.6 oz (51.982 kg)    Ideal Body Weight:  56.8 kg  BMI:  Body mass index is 19.07 kg/(m^2).  Estimated Nutritional Needs:   Kcal:  1300-1500  Protein:  60-70 grams  Fluid:  >1.3 L  EDUCATION NEEDS:   Education needs addressed  Thelmer Legler A. Jimmye Norman, RD, LDN, CDE Pager: [phone] After hours Pager: [phone]

## 2015-05-08 NOTE — Procedures (Addendum)
History: 81 yo F with transient facial droop  Sedation: none  Technique: This is a 19 channel routine scalp EEG performed at the bedside with bipolar and monopolar montages arranged in accordance to the international 10/20 system of electrode placement. One channel was dedicated to EKG recording.    Background: The background consists of intermixed alpha and beta activities. There is a well defined posterior dominant rhythm of 8.5 Hz that attenuates with eye opening. Sleep is recorded with normal appearing structures.   Photic stimulation: Physiologic driving is not performed  EEG Abnormalities: None  Clinical Interpretation: This normal EEG is recorded in the waking state. There was no seizure or seizure predisposition recorded on this study.   Roland Rack, MD [facility] [phone]  If 7pm- 7am, please page neurology on call as listed in [place].

## 2015-05-08 NOTE — Care Management Note (Signed)
Case Management Note  Patient Details  Name: Vanessa Clayton MRN: [id number] Date of Birth: 01/12/1926  Subjective/Objective:                    Action/Plan: Patient was admitted for TIA. Lives at home with spouse.  Will follow for discharge needs pending PT/OT evals and physician orders.  Expected Discharge Date:                  Expected Discharge Plan:  Vanessa Clayton  In-House Referral:     Discharge planning Services     Post Acute Care Choice:    Choice offered to:     DME Arranged:    DME Agency:     HH Arranged:    [place] Agency:     Status of Service:  In process, will continue to follow  Medicare Important Message Given:    Date Medicare IM Given:    Medicare IM give by:    Date Additional Medicare IM Given:    Additional Medicare Important Message give by:     If discussed at [place] of Stay Meetings, dates discussed:    Additional Comments:  Rolm Baptise, RN 05/08/2015, 11:10 AM

## 2015-05-08 NOTE — Progress Notes (Signed)
  Echocardiogram 2D Echocardiogram has been performed.  Darek Eifler 05/08/2015, 11:10 AM

## 2015-05-09 ENCOUNTER — Observation Stay (HOSPITAL_COMMUNITY): Payer: Medicare PPO

## 2015-05-09 DIAGNOSIS — G451 Carotid artery syndrome (hemispheric): Secondary | ICD-10-CM | POA: Diagnosis not present

## 2015-05-09 DIAGNOSIS — G454 Transient global amnesia: Secondary | ICD-10-CM | POA: Diagnosis not present

## 2015-05-09 DIAGNOSIS — I10 Essential (primary) hypertension: Secondary | ICD-10-CM | POA: Diagnosis not present

## 2015-05-09 DIAGNOSIS — E785 Hyperlipidemia, unspecified: Secondary | ICD-10-CM | POA: Diagnosis not present

## 2015-05-09 DIAGNOSIS — G459 Transient cerebral ischemic attack, unspecified: Secondary | ICD-10-CM | POA: Diagnosis not present

## 2015-05-09 DIAGNOSIS — E43 Unspecified severe protein-calorie malnutrition: Secondary | ICD-10-CM

## 2015-05-09 HISTORY — DX: Unspecified severe protein-calorie malnutrition: E43

## 2015-05-09 LAB — CBC
HEMATOCRIT: 33 % — AB (ref 36.0–46.0)
HEMOGLOBIN: 10.5 g/dL — AB (ref 12.0–15.0)
MCH: 23.8 pg — AB (ref 26.0–34.0)
MCHC: 31.8 g/dL (ref 30.0–36.0)
MCV: 74.7 fL — AB (ref 78.0–100.0)
Platelets: 269 10*3/uL (ref 150–400)
RBC: 4.42 MIL/uL (ref 3.87–5.11)
RDW: 14.6 % (ref 11.5–15.5)
WBC: 6.7 10*3/uL (ref 4.0–10.5)

## 2015-05-09 LAB — BASIC METABOLIC PANEL
ANION GAP: 11 (ref 5–15)
BUN: 24 mg/dL — ABNORMAL HIGH (ref 6–20)
CHLORIDE: 97 mmol/L — AB (ref 101–111)
CO2: 21 mmol/L — AB (ref 22–32)
Calcium: 9.1 mg/dL (ref 8.9–10.3)
Creatinine, Ser: 1.17 mg/dL — ABNORMAL HIGH (ref 0.44–1.00)
GFR calc non Af Amer: 41 mL/min — ABNORMAL LOW (ref >60)
GFR, EST AFRICAN AMERICAN: 47 mL/min — AB (ref >60)
Glucose, Bld: 84 mg/dL (ref 65–99)
POTASSIUM: 4.5 mmol/L (ref 3.5–5.1)
Sodium: 129 mmol/L — ABNORMAL LOW (ref 135–145)

## 2015-05-09 LAB — IRON AND TIBC
IRON: 81 ug/dL (ref 28–170)
SATURATION RATIOS: 35 % — AB (ref 10.4–31.8)
TIBC: 232 ug/dL — AB (ref 250–450)
UIBC: 151 ug/dL

## 2015-05-09 LAB — VITAMIN B12: Vitamin B-12: 473 pg/mL (ref 180–914)

## 2015-05-09 LAB — RETICULOCYTES
RBC.: 4.4 MIL/uL (ref 3.87–5.11)
RETIC CT PCT: 1.3 % (ref 0.4–3.1)
Retic Count, Absolute: 57.2 10*3/uL (ref 19.0–186.0)

## 2015-05-09 LAB — FOLATE: Folate: 18.7 ng/mL (ref >5.9)

## 2015-05-09 LAB — HEMOGLOBIN A1C
HEMOGLOBIN A1C: 5.7 % — AB (ref 4.8–5.6)
MEAN PLASMA GLUCOSE: 117 mg/dL

## 2015-05-09 LAB — FERRITIN: FERRITIN: 482 ng/mL — AB (ref 11–307)

## 2015-05-09 MED ORDER — ZOLPIDEM TARTRATE 5 MG PO TABS
5.0000 mg | ORAL_TABLET | Freq: Once | ORAL | Status: AC
  Administered 2015-05-09: 5 mg via ORAL
  Filled 2015-05-09: qty 1

## 2015-05-09 NOTE — Progress Notes (Signed)
Patient ID: Vanessa Clayton, female   DOB: 07-10-1926, 81 y.o.   MRN: [id number]  [facility] PROGRESS NOTE  Vanessa Clayton VVO:[id number] DOB: [birth year] DOA: 05/07/2015 PCP: Warren Danes, MD   Brief narrative:    81 y.o. female with hypertension, hypothyroidism, chronic anemia, and chronic kidney disease who was brought to the ER after patient was found to have brief episode of slurred speech. Symptoms have resolved within next hour and pt was brought in for further evaluation.   Assessment/Plan:    Slurred speech with left facial droop - ? TIA and with left MCA involvement, stroke certainly in differential - neurology team following  - carotid doppler pending - 2 D ECHO, EEG unremarkable  - LDL 129 - continue aspirin 325 mg PO QD  - PT evaluation done, HH OT recommended - appreciate neurology team following   Acute on chronic renal disease, stage II - III - Cr improving - repeat BMP in AM  Acute hyponatremia - drop in Na from 134 to 129, unclear etiology - encourage PO intake, will repeat BMP in AM and if Na stable, possible d/c home   Essential Hypertension - reasonable inpatient control   Hyperlipidemia - LDL 129, goal < 70 - Continue statin at discharge, pravastatin 20 mg PO QD  Hypothyroidism - Cont home synthroid dose  Chronic anemia, IDA - no signs of active bleeding - continue iron supplementation  - CBC In AM  Severe PCM - nutritionist consulted  DVT prophylaxis - Lovenox SQ  Code Status: Full.  Family Communication:  plan of care discussed with the patient Disposition Plan: Home in AM if Na is stable   IV access:  Peripheral IV  Procedures and diagnostic studies:    Ct Head Wo Contrast 05/07/2015  No acute intracranial pathology.  Age-related atrophy and chronic microvascular ischemic disease.  If symptoms persist and there are no contraindications, MRI may provide better evaluation if clinically indicated.    Mr Jodene Nam Head/brain Wo  Cm 05/08/2015  No proximal or large arterial branch occlusion within the intracranial circulation. 2. Short-segment moderate stenosis within the supraclinoid left ICA. 3. Mild to moderate short-segment stenosis of the proximal basilar artery. 4. No other significant or focal stenosis identified within the intracranial circulation.     Medical Consultants:  Neurology   Other Consultants:  PT/OT Nutritionist   IAnti-Infectives:   None  Faye Ramsay, MD  [facility] Pager [phone]  If 7PM-7AM, please contact night-coverage [email or web address] Password [facility] 05/09/2015, 12:53 PM   LOS: 2 days   HPI/Subjective: No events overnight.   Objective: Filed Vitals:   05/08/15 2104 05/09/15 0126 05/09/15 0538 05/09/15 0907  BP: 139/82 137/78 140/86 136/96  Pulse: 88 90 107 94  Temp: 97.9 F (36.6 C) 97.9 F (36.6 C) 98.3 F (36.8 C) 97.7 F (36.5 C)  TempSrc: Oral Oral Oral Oral  Resp: 18 18 20 20   Height:      Weight:      SpO2: 100% 100% 100% 100%    Intake/Output Summary (Last 24 hours) at 05/09/15 1253 Last data filed at 05/09/15 1042  Gross per 24 hour  Intake    120 ml  Output      0 ml  Net    120 ml    Exam:   General:  Pt is alert, follows commands appropriately, not in acute distress  Cardiovascular: Regular rate and rhythm, no rubs, no gallops  Respiratory: Clear to auscultation bilaterally, no wheezing, no crackles, no rhonchi  Abdomen: Soft, non tender, non distended, bowel sounds present, no guarding  Extremities: No edema, pulses DP and PT palpable bilaterally  Neuro: Grossly nonfocal  Data Reviewed: Basic Metabolic Panel:  Recent Labs Lab 05/07/15 1858 05/08/15 0649 05/09/15 0511  NA 132*  132* 134* 129*  K 4.4  4.3 4.2 4.5  CL 100*  100* 101 97*  CO2 22 24 21*  GLUCOSE 92  89 94 84  BUN 27*  30* 22* 24*  CREATININE 1.42*  1.50* 1.28* 1.17*  CALCIUM 9.3 9.4 9.1   Liver Function Tests:  Recent Labs Lab 05/07/15 1858 05/08/15 0649   AST 17 17  ALT 7* 7*  ALKPHOS 47 49  BILITOT 0.5 0.6  PROT 7.0 7.4  ALBUMIN 3.6 3.5   CBC:  Recent Labs Lab 05/07/15 1858 05/08/15 0649 05/09/15 0511  WBC 8.3 6.7 6.7  NEUTROABS 3.6  --   --   HGB 10.2*  12.9 9.9* 10.5*  HCT 31.0*  38.0 30.4* 33.0*  MCV 75.1* 75.4* 74.7*  PLT 286 289 269   CBG:  Recent Labs Lab 05/07/15 1851  GLUCAP 75    Scheduled Meds: .  stroke: mapping our early stages of recovery book   Does not apply Once  . aspirin  325 mg Oral Daily  . atenolol  25 mg Oral Daily  . enoxaparin (LOVENOX) injection  30 mg Subcutaneous Daily  . escitalopram  10 mg Oral Daily  . feeding supplement (ENSURE ENLIVE)  237 mL Oral BID BM  . ferrous sulfate  325 mg Oral TID WC  . levothyroxine  75 mcg Oral QAC breakfast  . pravastatin  20 mg Oral q1800   Continuous Infusions: . sodium chloride 10 mL/hr at 05/07/15 2300

## 2015-05-09 NOTE — Progress Notes (Signed)
Physical Therapy Treatment Patient Details Name: Vanessa Clayton MRN: [id number] DOB: March 04, 1926 Today's Date: 05/09/2015    History of Present Illness Pt is a 81 y.o. female with history of HTN, hypothyroidism, chronic anemia, chronic kidney disease was brought to the ER after patient was found to have brief episode of patient being unable to speak and right facial droop. Pt says she did have some visual blurriness. Reports weakness in right hand grip strength. Head CT, MRI unremarkable.    PT Comments    Pt alert/oriented and willing to participate in PT.  Pt continues to perform functional mobility well but continues to have occasional instability thus increasing fall risk. No reports of SOB/dizziness/lightheadedness with activity.  No further equipment needed and PT continues to recommend home health PT and 24-hour supervision for safety upon d/c.  Follow Up Recommendations  Home health PT;Supervision/Assistance - 24 hour     Equipment Recommendations  None recommended by PT    Recommendations for Other Services       Precautions / Restrictions Precautions Precautions: Fall Restrictions Weight Bearing Restrictions: No    Mobility  Bed Mobility Overal bed mobility: Modified Independent             General bed mobility comments: used bed rails; HOB slightly elevated  Transfers Overall transfer level: Needs assistance Equipment used: Straight cane Transfers: Sit to/from Stand Sit to Stand: Supervision (without assistive device)         General transfer comment: Supervision for safety.   Ambulation/Gait Ambulation/Gait assistance: Supervision Ambulation Distance (Feet): 200 Feet (100' x2 with stair training between trials) Assistive device: Straight cane (used in R hand for gait) Gait Pattern/deviations: Step-through pattern;Decreased stride length;Drifts right/left     General Gait Details: Used SPC with R UE well with minimal cueing; occasional instability with  pt able to correct; pt reporting mild knee pain (arthritis)   Stairs Stairs: Yes Stairs assistance: Supervision Stair Management: One rail Right;With cane (railing on R, cane on L) Number of Stairs: 6 General stair comments: Good technique with minimal cueing; supervision for safety  Wheelchair Mobility    Modified Rankin (Stroke Patients Only)       Balance Overall balance assessment: Needs assistance Sitting-balance support: Feet supported Sitting balance-Leahy Scale: Good     Standing balance support: Single extremity supported (cane in R hand) Standing balance-Leahy Scale: Fair                     Cognition Arousal/Alertness: Awake/alert Behavior During Therapy: WFL for tasks assessed/performed Overall Cognitive Status: Within Functional Limits for tasks assessed                     Exercises      General Comments General comments (skin integrity, edema, etc.): Daughter present during session.      Pertinent Vitals/Pain Pain Assessment: No/denies pain Faces Pain Scale: Hurts a little bit Pain Location: low back *(especially on L) Pain Intervention(s): Monitored during session    Home Living                      Prior Function            PT Goals (current goals can now be found in the care plan section) Acute Rehab PT Goals Patient Stated Goal: to go home PT Goal Formulation: With patient Time For Goal Achievement: 05/22/15 Potential to Achieve Goals: Fair    Frequency  Min 4X/week  PT Plan Current plan remains appropriate    Co-evaluation             End of Session Equipment Utilized During Treatment: Gait belt;Other (comment) (cane) Activity Tolerance: Patient tolerated treatment well Patient left: in bed;with call bell/phone within reach;with bed alarm set;with family/visitor present     Time: 0102-7253 PT Time Calculation (min) (ACUTE ONLY): 18 min  Charges:  $Gait Training: 8-22 mins                     G Codes:      Kalesha Irving 05-31-2015, 4:46 PM   Lorita Officer, SPT

## 2015-05-09 NOTE — Progress Notes (Signed)
STROKE TEAM PROGRESS NOTE   SUBJECTIVE (INTERVAL HISTORY) No family at bedside. She had CUS which was unremarkable. She has no complains. Her Na 129 today down from 134 yesterday.    OBJECTIVE Temp:  [97.6 F (36.4 C)-98.3 F (36.8 C)] 98.3 F (36.8 C) (09/07 0538) Pulse Rate:  [88-107] 107 (09/07 0538) Cardiac Rhythm:  [-] Normal sinus rhythm (09/06 1900) Resp:  [18-20] 20 (09/07 0538) BP: (106-150)/(58-90) 140/86 mmHg (09/07 0538) SpO2:  [98 %-100 %] 100 % (09/07 0538)  CBC:   Recent Labs Lab 05/07/15 1858 05/08/15 0649 05/09/15 0511  WBC 8.3 6.7 6.7  NEUTROABS 3.6  --   --   HGB 10.2*  12.9 9.9* 10.5*  HCT 31.0*  38.0 30.4* 33.0*  MCV 75.1* 75.4* 74.7*  PLT 286 289 272    Basic Metabolic Panel:   Recent Labs Lab 05/08/15 0649 05/09/15 0511  NA 134* 129*  K 4.2 4.5  CL 101 97*  CO2 24 21*  GLUCOSE 94 84  BUN 22* 24*  CREATININE 1.28* 1.17*  CALCIUM 9.4 9.1    Lipid Panel:     Component Value Date/Time   CHOL 187 05/08/2015 0649   TRIG 84 05/08/2015 0649   HDL 41 05/08/2015 0649   CHOLHDL 4.6 05/08/2015 0649   VLDL 17 05/08/2015 0649   LDLCALC 129* 05/08/2015 0649   HgbA1c:  Lab Results  Component Value Date   HGBA1C 5.7* 05/08/2015   Urine Drug Screen: No results found for: LABOPIA, COCAINSCRNUR, LABBENZ, AMPHETMU, THCU, LABBARB    IMAGING I have personally reviewed the radiological images below and agree with the radiology interpretations.  Ct Head Wo Contrast 05/07/2015   No acute intracranial pathology.  Age-related atrophy and chronic microvascular ischemic disease.    MRI HEAD  05/08/2015   1. Punctate focus of high signal intensity on axial DWI sequence within the right paramedian splenium. This finding is favored to be artifactual in nature, although possible tiny focus of acute ischemia is not entirely excluded. Correlation with symptomatology and history recommended. 2. No other acute intracranial infarct or other process identified.  3. Generalized age-related cerebral atrophy with mild chronic small vessel ischemic disease.    MRA HEAD  05/08/2015   1. No proximal or large arterial branch occlusion within the intracranial circulation. 2. Short-segment moderate stenosis within the supraclinoid left ICA. 3. Mild to moderate short-segment stenosis of the proximal basilar artery. 4. No other significant or focal stenosis identified within the intracranial circulation.     2D echo - - Left ventricle: The cavity size was normal. Wall thickness wasincreased in a pattern of moderate LVH. Systolic function wasnormal. The estimated ejection fraction was in the range of 60%to 65%. Wall motion was normal; there were no regional wallmotion abnormalities. Doppler parameters are consistent withabnormal left ventricular relaxation (grade 1 diastolicdysfunction). - Aortic valve: Trileaflet; mildly thickened, mildly calcified leaflets. - Tricuspid valve: There was moderate regurgitation. - Pulmonary arteries: Systolic pressure was mildly increased. PApeak pressure: 31 mm Hg (S). - Pericardium, extracardiac: A small pericardial effusion was identified along the right ventricular free wall. There was no evidence of hemodynamic compromise Impressions:  Prior echocardiogram from 2014 demonstrated trivial anterior/ RVfree wall pericardial effusion which now has mildly increased. No cardiac source of emboli was indentified.  CUS - Bilateral: 1-39% ICA stenosis. Vertebral artery flow is antegrade.  EEG - This normal EEG is recorded in the waking state. There was no seizure or seizure predisposition recorded on this study.  PHYSICAL EXAM  General - Well nourished, well developed, in no apparent distress.  Ophthalmologic - Fundi not visualized due to noncooperation.  Cardiovascular - Regular rate and rhythm with no murmur.  Mental Status -  Level of arousal and orientation to time, place, and person were intact. Language  including expression, naming, repetition, comprehension was assessed and found intact. Fund of Knowledge was assessed and was impaired.  Cranial Nerves II - XII - II - Visual field intact OU. III, IV, VI - Extraocular movements intact. V - Facial sensation intact bilaterally. VII - Facial movement intact bilaterally. VIII - Hearing & vestibular intact bilaterally. X - Palate elevates symmetrically. XI - Chin turning & shoulder shrug intact bilaterally. XII - Tongue protrusion intact.  Motor Strength - The patient's strength was normal in all extremities and pronator drift was absent.  Bulk was normal and fasciculations were absent.   Motor Tone - Muscle tone was assessed at the neck and appendages and was normal.  Reflexes - The patient's reflexes were 1+ in all extremities and she had no pathological reflexes.  Sensory - Light touch, temperature/pinprick were assessed and were symmetrical.    Coordination - The patient had normal movements in the hands and feet with no ataxia or dysmetria.  Tremor was absent.  Gait and Station - deferred for safety concerns.   ASSESSMENT/PLAN Ms. Vanessa Clayton is a 81 y.o. female with history of hypertension, hypothyroidism, chronic anemia, chronic kidney disease presenting with difficulty with speech, left facial droop, difficulty writing. She did not receive IV t-PA due to resolution of symptoms.   TIA with left MCA involvement most likely, less likely Seizure or complicated/neurlogic migraine Stroke:  Incidental R paramedian splenium infarct not related to presenting symptoms secondary to small vessel disease source  Resultant  Neurologic deficits resolved   MRI  Possible R paramedian splenium infarct vs artifact (does not correlate with presenting symptoms)  MRA  No large vessel significant stenosis  Carotid Doppler  unremarkable  2D Echo  unremarkable   EEG - normal   LDL 129  HgbA1c 5.7  Lovenox 30 mg sq daily for VTE  prophylaxis Diet Heart Room service appropriate?: Yes; Fluid consistency:: Thin  aspirin 81 mg orally every day prior to admission, now on aspirin 325 mg orally every day.  Patient counseled to be compliant with her antithrombotic medications  Ongoing aggressive stroke risk factor management  Therapy recommendations:  HH OT  Disposition:  pending   Essential Hypertension  Stable  Permissive hypertension (OK if < 220/120) but gradually normalize in 5-7 days  On home atenolol  Hyperlipidemia  Home meds:  No statin  LDL 129, goal < 70  Put on pravastatin 20mg   Continue statin at discharge  Other Stroke Risk Factors  Advanced age  Other Active Problems  Hx L breast cancer  Hypothyroidism  Chronic anxiety  Hospital day # 2  Neurology will sign off. Please call with questions. Pt will follow up with Dr. Erlinda Hong at [facility] in about 2 months. Thanks for the consult.  Rosalin Hawking, MD PhD Stroke Neurology 05/09/2015 1:23 PM   To contact Stroke Continuity provider, please refer to [email or web address] After hours, contact General Neurology

## 2015-05-09 NOTE — Progress Notes (Signed)
VASCULAR LAB PRELIMINARY  PRELIMINARY  PRELIMINARY  PRELIMINARY  Carotid duplex completed.    Preliminary report:  Bilateral:  1-39% ICA stenosis.  Vertebral artery flow is antegrade.     Mana Morison, [place], RVS 05/09/2015, 1:02 PM

## 2015-05-09 NOTE — Progress Notes (Signed)
Occupational Therapy Treatment Patient Details Name: Vanessa Clayton MRN: [id number] DOB: [birth year] Today's Date: 05/09/2015    History of present illness Pt is a 81 y.o. female with history of HTN, hypothyroidism, chronic anemia, chronic kidney disease was brought to the ER after patient was found to have brief episode of patient being unable to speak and right facial droop. Pt says she did have some visual blurriness. Reports weakness in right hand grip strength. Head CT, MRI unremarkable.   OT comments  Pt progressing towards acute OT goals. Focus of session was OOB ADLs and HEP for strengthening and fine motor coordination of RUE. D/c plan remains appropriate.   Follow Up Recommendations  Supervision/Assistance - 24 hour;Home health OT    Equipment Recommendations  3 in 1 bedside comode;Tub/shower seat    Recommendations for Other Services      Precautions / Restrictions Precautions Precautions: Fall Restrictions Weight Bearing Restrictions: No       Mobility Bed Mobility Overal bed mobility: Modified Independent             General bed mobility comments: Extra time, used bed rails  Transfers Overall transfer level: Needs assistance Equipment used: Rolling walker (2 wheeled);None   Sit to Stand: Supervision         General transfer comment: Supervision for safety.     Balance Overall balance assessment: Needs assistance         Standing balance support: No upper extremity supported;During functional activity Standing balance-Leahy Scale: Fair Standing balance comment: stood to wash hands with no external support                   ADL Overall ADL's : Needs assistance/impaired     Grooming: Wash/dry hands;Min Dispensing optician: Supervision/safety (3n1 over toilet)           Functional mobility during ADLs: Min guard General ADL Comments: Pt in bathroom upon therapist arrival. Completed toilet  transfer, grooming, and functional mobility as detailed above. Pt with intermittent use of rw and noted to leave it to the side at times. Min guard for safety with no LOB noted with or without AD. Issued theraband level 1 and instucted pt in HEP with return demo of 5 reps each given. Pt able to complete reps with some decreased activity tolerance noted in RUE (tremulous). Also issued fine motor coordination HEP and reviewed aith pt.       Vision                     Perception     Praxis      Cognition   Behavior During Therapy: WFL for tasks assessed/performed Overall Cognitive Status: Within Functional Limits for tasks assessed       Memory: Decreased short-term memory               Extremity/Trunk Assessment               Exercises     Shoulder Instructions       General Comments      Pertinent Vitals/ Pain       Pain Assessment: No/denies pain Faces Pain Scale: Hurts a little bit Pain Location: back  Pain Intervention(s): Monitored during session  Home Living  Prior Functioning/Environment              Frequency Min 3X/week     Progress Toward Goals  OT Goals(current goals can now be found in the care plan section)  Progress towards OT goals: Progressing toward goals  Acute Rehab OT Goals Patient Stated Goal: to go home OT Goal Formulation: With patient Time For Goal Achievement: 05/15/15 Potential to Achieve Goals: Good ADL Goals Pt Will Perform Grooming: with modified independence;sitting Pt Will Perform Lower Body Bathing: with modified independence;with adaptive equipment;sit to/from stand Pt Will Perform Lower Body Dressing: with modified independence;with adaptive equipment;sit to/from stand Pt Will Transfer to Toilet: with modified independence;ambulating Pt Will Perform Toileting - Clothing Manipulation and hygiene: with modified independence;with adaptive  equipment;sit to/from stand;sitting/lateral leans Pt Will Perform Tub/Shower Transfer: Tub transfer;with min guard assist;ambulating;shower seat;3 in 1;rolling walker Pt/caregiver will Perform Home Exercise Program: Increased strength;Right Upper extremity;With written HEP provided;With theraputty;With theraband  Plan Discharge plan remains appropriate    Co-evaluation                 End of Session Equipment Utilized During Treatment: Gait belt;Rolling walker   Activity Tolerance Patient tolerated treatment well   Patient Left in bed;with call bell/phone within reach;with bed alarm set   Nurse Communication      Functional Assessment Tool Used: clnical judgement Functional Limitation: Self care Self Care Current Status (F0932): At least 1 percent but less than 20 percent impaired, limited or restricted Self Care Goal Status (T5573): At least 1 percent but less than 20 percent impaired, limited or restricted   Time: 2202-5427 OT Time Calculation (min): 21 min  Charges: OT G-codes **NOT FOR INPATIENT CLASS** Functional Assessment Tool Used: clnical judgement Functional Limitation: Self care Self Care Current Status (C6237): At least 1 percent but less than 20 percent impaired, limited or restricted Self Care Goal Status (S2831): At least 1 percent but less than 20 percent impaired, limited or restricted OT General Charges $OT Visit: 1 Procedure OT Treatments $Self Care/Home Management : 8-22 mins  Vanessa Clayton 05/09/2015, 3:01 PM

## 2015-05-09 NOTE — Care Management Note (Signed)
Case Management Note  Patient Details  Name: Vanessa Clayton MRN: [id number] Date of Birth: 16-Aug-1926  Subjective/Objective:                    Action/Plan: CM met with patient and daughter Vanessa Clayton [phone]) at the bedside. Plan is for patient to be discharged home tomorrow with [facility] PT/OT. Pt and daughter have selected [facility]. Miranda with [facility] notified and accepted the referral. Miranda given daughters phone number and home number of [phone] as contact.   Expected Discharge Date:   (pending)               Expected Discharge Plan:  [place]  In-House Referral:     Discharge planning Services  CM Consult  Post Acute Care Choice:    Choice offered to:  Patient, Adult Children  DME Arranged:    DME Agency:     HH Arranged:  PT, OT HH Agency:  [place]  Status of Service:  Completed, signed off  Medicare Important Message Given:    Date Medicare IM Given:    Medicare IM give by:    Date Additional Medicare IM Given:    Additional Medicare Important Message give by:     If discussed at [place] of Stay Meetings, dates discussed:    Additional Comments:  Ollen Gross, RN 05/09/2015, 2:45 PM

## 2015-05-10 DIAGNOSIS — G454 Transient global amnesia: Secondary | ICD-10-CM | POA: Diagnosis not present

## 2015-05-10 LAB — CBC
HCT: 31.3 % — ABNORMAL LOW (ref 36.0–46.0)
Hemoglobin: 9.9 g/dL — ABNORMAL LOW (ref 12.0–15.0)
MCH: 23.5 pg — ABNORMAL LOW (ref 26.0–34.0)
MCHC: 31.6 g/dL (ref 30.0–36.0)
MCV: 74.2 fL — ABNORMAL LOW (ref 78.0–100.0)
PLATELETS: 258 10*3/uL (ref 150–400)
RBC: 4.22 MIL/uL (ref 3.87–5.11)
RDW: 14.5 % (ref 11.5–15.5)
WBC: 6.3 10*3/uL (ref 4.0–10.5)

## 2015-05-10 LAB — BASIC METABOLIC PANEL
ANION GAP: 9 (ref 5–15)
BUN: 21 mg/dL — ABNORMAL HIGH (ref 6–20)
CALCIUM: 9.2 mg/dL (ref 8.9–10.3)
CO2: 25 mmol/L (ref 22–32)
Chloride: 95 mmol/L — ABNORMAL LOW (ref 101–111)
Creatinine, Ser: 1.05 mg/dL — ABNORMAL HIGH (ref 0.44–1.00)
GFR, EST AFRICAN AMERICAN: 54 mL/min — AB (ref >60)
GFR, EST NON AFRICAN AMERICAN: 47 mL/min — AB (ref >60)
Glucose, Bld: 93 mg/dL (ref 65–99)
POTASSIUM: 4.2 mmol/L (ref 3.5–5.1)
SODIUM: 129 mmol/L — AB (ref 135–145)

## 2015-05-10 MED ORDER — PRAVASTATIN SODIUM 20 MG PO TABS: 20.0000 mg | ORAL_TABLET | Freq: Every day | ORAL | Status: DC

## 2015-05-10 MED ORDER — ENSURE ENLIVE PO LIQD: 237.0000 mL | Freq: Two times a day (BID) | ORAL | Status: DC

## 2015-05-10 MED ORDER — ASPIRIN 325 MG PO TABS: 325.0000 mg | ORAL_TABLET | Freq: Every day | ORAL | Status: DC

## 2015-05-10 NOTE — Discharge Summary (Signed)
Physician Discharge Summary  ANAY WALTER MRN: [id number] DOB/AGE: 79/06/1926 79 y.o.  PCP: Warren Danes, MD   Admit date: 05/07/2015 Discharge date: 05/10/2015  Discharge Diagnoses:     Principal Problem:   TIA (transient ischemic attack) Active Problems:   Hypothyroidism   Hypertension   HLD (hyperlipidemia)   Protein-calorie malnutrition, severe    Follow-up recommendations Follow-up with PCP in 3-5 days , including all  additional recommended appointments as below Follow-up CBC, CMP, TSH in 3-5 days      Medication List    STOP taking these medications        Aspirin-Caffeine 500-32.5 MG Tabs     naproxen 500 MG tablet  Commonly known as:  NAPROSYN      TAKE these medications        acetaminophen 500 MG tablet  Commonly known as:  TYLENOL  Take 1,000 mg by mouth every 6 (six) hours as needed for moderate pain.     aspirin 325 MG tablet  Take 1 tablet (325 mg total) by mouth daily.     atenolol 50 MG tablet  Commonly known as:  TENORMIN  Take 0.5 tablets (25 mg total) by mouth daily.     escitalopram 10 MG tablet  Commonly known as:  LEXAPRO  Take 10 mg by mouth daily.     feeding supplement Liqd  Take 1 Container by mouth 2 (two) times daily.     feeding supplement (ENSURE ENLIVE) Liqd  Take 237 mLs by mouth 2 (two) times daily between meals.     levothyroxine 75 MCG tablet  Commonly known as:  SYNTHROID, LEVOTHROID  Take 75 mcg by mouth daily before breakfast.     lidocaine 4 % cream  Commonly known as:  LMX  Apply 1 application topically daily as needed (for knee and back pain).     lidocaine-hydrocortisone 3-0.5 % Crea  Commonly known as:  ANAMANTEL HC  Place 1 Applicatorful rectally.     pravastatin 20 MG tablet  Commonly known as:  PRAVACHOL  Take 1 tablet (20 mg total) by mouth daily at 6 PM.     SLOW FE PO  Take 1 tablet by mouth daily.     Vitamin D (Ergocalciferol) 50000 UNITS Caps capsule  Commonly known as:  DRISDOL   50,000 Units by Per NG tube route every 14 (fourteen) days.         Discharge Condition: *Stable  Disposition: 01-Home or Self Care   Consults:  Neurology   Significant Diagnostic Studies:  Ct Head Wo Contrast  05/07/2015   CLINICAL DATA:  81 year old female with left facial foci concern for stroke.  EXAM: CT HEAD WITHOUT CONTRAST  TECHNIQUE: Contiguous axial images were obtained from the base of the skull through the vertex without intravenous contrast.  COMPARISON:  None.  FINDINGS: The ventricles are dilated and the sulci are prominent compatible with age-related atrophy. Periventricular and deep white matter hypodensities represent chronic microvascular ischemic changes. There is no intracranial hemorrhage. No mass effect or midline shift identified.  The visualized paranasal sinuses and mastoid air cells are well aerated. The calvarium is intact.  IMPRESSION: No acute intracranial pathology.  Age-related atrophy and chronic microvascular ischemic disease.  If symptoms persist and there are no contraindications, MRI may provide better evaluation if clinically indicated.  These results were called by telephone at the time of interpretation on 05/07/2015 at 7:08 pm to Dr. Brantley Stage , who verbally acknowledged these results.   Electronically Signed  By: Anner Crete M.D.   On: 05/07/2015 19:09   Mr Brain Wo Contrast  05/08/2015   CLINICAL DATA:  Initial evaluation for episode of left facial droop with speech difficulty, now resolved.  EXAM: MRI HEAD WITHOUT CONTRAST  MRA HEAD WITHOUT CONTRAST  TECHNIQUE: Multiplanar, multiecho pulse sequences of the brain and surrounding structures were obtained without intravenous contrast. Angiographic images of the head were obtained using MRA technique without contrast.  COMPARISON:  Prior CT from 05/07/2015.  FINDINGS: MRI HEAD FINDINGS  Diffuse prominence of the CSF containing spaces is compatible with generalized age-related cerebral atrophy. Patchy  and confluent T2/FLAIR hyperintensity within the periventricular and deep white matter both cerebral hemispheres present, most compatible with chronic small vessel ischemic type changes.  There is a punctate focus of high signal intensity within the right para median splenium on axial DWI sequence (series 3, image 23). This is favored to be artifactual in nature, although possible tiny focus of acute ischemia not entirely excluded. No definite T2 or FLAIR correlate identified. Focus felt to be too small to be appreciated on corresponding ADC map. No other abnormal foci of restricted diffusion identified. Normal intravascular flow voids are maintained. No acute or chronic intracranial hemorrhage.  No mass lesion, midline shift, or mass effect. No hydrocephalus. No extra-axial fluid collection.  Craniocervical junction within normal limits. Prominent multilevel degenerative changes present within the visualized upper cervical spine. Pituitary gland normal.  No acute abnormality about the orbits.  Paranasal sinuses and mastoid air cells are clear. Inner ear structures grossly normal.  Bone marrow signal intensity within normal limits. No scalp soft tissue abnormality.  MRA HEAD FINDINGS  ANTERIOR CIRCULATION:  Visualized distal cervical segments of the internal carotid arteries are patent with antegrade flow. The petrous segments are widely patent. Cavernous segments well opacified bilaterally. Supraclinoid right ICA widely patent. There is focal moderate stenosis of the supraclinoid left ICA (series 6, image 81). Right A1 segment is absent. Left A1 segment widely patent. Anterior communicating artery and anterior cerebral arteries well opacified.  M1 segments widely patent without stenosis or occlusion. MCA bifurcations normal. Distal MCA branches symmetric bilaterally.  POSTERIOR CIRCULATION:  Left vertebral artery is dominant and widely patent to the vertebrobasilar junction. Diminutive right vertebral artery is  attenuated but patent to the vertebrobasilar junction as well. Posterior inferior cerebellar arteries patent bilaterally. Basilar artery mildly tortuous with atheromatous irregularity. Mild to moderate focal atheromatous narrowing of the proximal basilar artery. Superior cerebellar arteries well opacified. Left P1 and P2 segments widely patent. There is fetal origin of the right PCA with widely patent right posterior communicating artery. Right PCA well opacified to its distal aspect.  No aneurysm or vascular malformation.  IMPRESSION: MRI HEAD IMPRESSION:  1. Punctate focus of high signal intensity on axial DWI sequence within the right paramedian splenium. This finding is favored to be artifactual in nature, although possible tiny focus of acute ischemia is not entirely excluded. Correlation with symptomatology and history recommended. 2. No other acute intracranial infarct or other process identified. 3. Generalized age-related cerebral atrophy with mild chronic small vessel ischemic disease.  MRA HEAD IMPRESSION:  1. No proximal or large arterial branch occlusion within the intracranial circulation. 2. Short-segment moderate stenosis within the supraclinoid left ICA. 3. Mild to moderate short-segment stenosis of the proximal basilar artery. 4. No other significant or focal stenosis identified within the intracranial circulation.   Electronically Signed   By: Jeannine Boga M.D.   On: 05/08/2015 05:55  Mr Jodene Nam Head/brain Wo Cm  05/08/2015   CLINICAL DATA:  Initial evaluation for episode of left facial droop with speech difficulty, now resolved.  EXAM: MRI HEAD WITHOUT CONTRAST  MRA HEAD WITHOUT CONTRAST  TECHNIQUE: Multiplanar, multiecho pulse sequences of the brain and surrounding structures were obtained without intravenous contrast. Angiographic images of the head were obtained using MRA technique without contrast.  COMPARISON:  Prior CT from 05/07/2015.  FINDINGS: MRI HEAD FINDINGS  Diffuse prominence  of the CSF containing spaces is compatible with generalized age-related cerebral atrophy. Patchy and confluent T2/FLAIR hyperintensity within the periventricular and deep white matter both cerebral hemispheres present, most compatible with chronic small vessel ischemic type changes.  There is a punctate focus of high signal intensity within the right para median splenium on axial DWI sequence (series 3, image 23). This is favored to be artifactual in nature, although possible tiny focus of acute ischemia not entirely excluded. No definite T2 or FLAIR correlate identified. Focus felt to be too small to be appreciated on corresponding ADC map. No other abnormal foci of restricted diffusion identified. Normal intravascular flow voids are maintained. No acute or chronic intracranial hemorrhage.  No mass lesion, midline shift, or mass effect. No hydrocephalus. No extra-axial fluid collection.  Craniocervical junction within normal limits. Prominent multilevel degenerative changes present within the visualized upper cervical spine. Pituitary gland normal.  No acute abnormality about the orbits.  Paranasal sinuses and mastoid air cells are clear. Inner ear structures grossly normal.  Bone marrow signal intensity within normal limits. No scalp soft tissue abnormality.  MRA HEAD FINDINGS  ANTERIOR CIRCULATION:  Visualized distal cervical segments of the internal carotid arteries are patent with antegrade flow. The petrous segments are widely patent. Cavernous segments well opacified bilaterally. Supraclinoid right ICA widely patent. There is focal moderate stenosis of the supraclinoid left ICA (series 6, image 81). Right A1 segment is absent. Left A1 segment widely patent. Anterior communicating artery and anterior cerebral arteries well opacified.  M1 segments widely patent without stenosis or occlusion. MCA bifurcations normal. Distal MCA branches symmetric bilaterally.  POSTERIOR CIRCULATION:  Left vertebral artery is  dominant and widely patent to the vertebrobasilar junction. Diminutive right vertebral artery is attenuated but patent to the vertebrobasilar junction as well. Posterior inferior cerebellar arteries patent bilaterally. Basilar artery mildly tortuous with atheromatous irregularity. Mild to moderate focal atheromatous narrowing of the proximal basilar artery. Superior cerebellar arteries well opacified. Left P1 and P2 segments widely patent. There is fetal origin of the right PCA with widely patent right posterior communicating artery. Right PCA well opacified to its distal aspect.  No aneurysm or vascular malformation.  IMPRESSION: MRI HEAD IMPRESSION:  1. Punctate focus of high signal intensity on axial DWI sequence within the right paramedian splenium. This finding is favored to be artifactual in nature, although possible tiny focus of acute ischemia is not entirely excluded. Correlation with symptomatology and history recommended. 2. No other acute intracranial infarct or other process identified. 3. Generalized age-related cerebral atrophy with mild chronic small vessel ischemic disease.  MRA HEAD IMPRESSION:  1. No proximal or large arterial branch occlusion within the intracranial circulation. 2. Short-segment moderate stenosis within the supraclinoid left ICA. 3. Mild to moderate short-segment stenosis of the proximal basilar artery. 4. No other significant or focal stenosis identified within the intracranial circulation.   Electronically Signed   By: Jeannine Boga M.D.   On: 05/08/2015 05:55    2D echo - - Left ventricle: The cavity size  was normal. Wall thickness wasincreased in a pattern of moderate LVH. Systolic function wasnormal. The estimated ejection fraction was in the range of 60%to 65%. Wall motion was normal; there were no regional wallmotion abnormalities. Doppler parameters are consistent withabnormal left ventricular relaxation (grade 1 diastolicdysfunction). - Aortic valve:  Trileaflet; mildly thickened, mildly calcified leaflets. - Tricuspid valve: There was moderate regurgitation. - Pulmonary arteries: Systolic pressure was mildly increased. PApeak pressure: 31 mm Hg (S). - Pericardium, extracardiac: A small pericardial effusion was identified along the right ventricular free wall. There was no evidence of hemodynamic compromise Impressions: Prior echocardiogram from 2014 demonstrated trivial anterior/ RVfree wall pericardial effusion which now has mildly increased. No cardiac source of emboli was indentified.  CUS - Bilateral: 1-39% ICA stenosis. Vertebral artery flow is antegrade.  EEG - This normal EEG is recorded in the waking state. There was no seizure or seizure predisposition recorded on this study   Filed Weights   05/07/15 1828 05/07/15 2323  Weight: 54.432 kg (120 lb) 51.982 kg (114 lb 9.6 oz)     Microbiology: No results found for this or any previous visit (from the past 240 hour(s)).     Blood Culture    Component Value Date/Time   SDES URINE, RANDOM 03/19/2015 1413   SPECREQUEST NONE 03/19/2015 1413   CULT  03/19/2015 1413    MULTIPLE SPECIES PRESENT, SUGGEST RECOLLECTION IF CLINICALLY INDICATED   REPTSTATUS 03/21/2015 FINAL 03/19/2015 1413      Labs: Results for orders placed or performed during the hospital encounter of 05/07/15 (from the past 48 hour(s))  Basic metabolic panel     Status: Abnormal   Collection Time: 05/09/15  5:11 AM  Result Value Ref Range   Sodium 129 (L) 135 - 145 mmol/L   Potassium 4.5 3.5 - 5.1 mmol/L   Chloride 97 (L) 101 - 111 mmol/L   CO2 21 (L) 22 - 32 mmol/L   Glucose, Bld 84 65 - 99 mg/dL   BUN 24 (H) 6 - 20 mg/dL   Creatinine, Ser 1.17 (H) 0.44 - 1.00 mg/dL   Calcium 9.1 8.9 - 10.3 mg/dL   GFR calc non Af Amer 41 (L) >60 mL/min   GFR calc Af Amer 47 (L) >60 mL/min    Comment: (NOTE) The eGFR has been calculated using the CKD EPI equation. This calculation has not been  validated in all clinical situations. eGFR's persistently <60 mL/min signify possible Chronic Kidney Disease.    Anion gap 11 5 - 15  CBC     Status: Abnormal   Collection Time: 05/09/15  5:11 AM  Result Value Ref Range   WBC 6.7 4.0 - 10.5 K/uL   RBC 4.42 3.87 - 5.11 MIL/uL   Hemoglobin 10.5 (L) 12.0 - 15.0 g/dL   HCT 33.0 (L) 36.0 - 46.0 %   MCV 74.7 (L) 78.0 - 100.0 fL   MCH 23.8 (L) 26.0 - 34.0 pg   MCHC 31.8 30.0 - 36.0 g/dL   RDW 14.6 11.5 - 15.5 %   Platelets 269 150 - 400 K/uL  Vitamin B12     Status: None   Collection Time: 05/09/15  5:11 AM  Result Value Ref Range   Vitamin B-12 473 180 - 914 pg/mL    Comment: (NOTE) This assay is not validated for testing neonatal or myeloproliferative syndrome specimens for Vitamin B12 levels.   Folate     Status: None   Collection Time: 05/09/15  5:11 AM  Result Value Ref  Range   Folate 18.7 >5.9 ng/mL  Iron and TIBC     Status: Abnormal   Collection Time: 05/09/15  5:11 AM  Result Value Ref Range   Iron 81 28 - 170 ug/dL   TIBC 232 (L) 250 - 450 ug/dL   Saturation Ratios 35 (H) 10.4 - 31.8 %   UIBC 151 ug/dL  Ferritin     Status: Abnormal   Collection Time: 05/09/15  5:11 AM  Result Value Ref Range   Ferritin 482 (H) 11 - 307 ng/mL  Reticulocytes     Status: None   Collection Time: 05/09/15  5:11 AM  Result Value Ref Range   Retic Ct Pct 1.3 0.4 - 3.1 %   RBC. 4.40 3.87 - 5.11 MIL/uL   Retic Count, Manual 57.2 19.0 - 186.0 K/uL  CBC     Status: Abnormal   Collection Time: 05/10/15  6:54 AM  Result Value Ref Range   WBC 6.3 4.0 - 10.5 K/uL   RBC 4.22 3.87 - 5.11 MIL/uL   Hemoglobin 9.9 (L) 12.0 - 15.0 g/dL   HCT 31.3 (L) 36.0 - 46.0 %   MCV 74.2 (L) 78.0 - 100.0 fL   MCH 23.5 (L) 26.0 - 34.0 pg   MCHC 31.6 30.0 - 36.0 g/dL   RDW 14.5 11.5 - 15.5 %   Platelets 258 150 - 400 K/uL  Basic metabolic panel     Status: Abnormal   Collection Time: 05/10/15  6:54 AM  Result Value Ref Range   Sodium 129 (L) 135 - 145  mmol/L   Potassium 4.2 3.5 - 5.1 mmol/L   Chloride 95 (L) 101 - 111 mmol/L   CO2 25 22 - 32 mmol/L   Glucose, Bld 93 65 - 99 mg/dL   BUN 21 (H) 6 - 20 mg/dL   Creatinine, Ser 1.05 (H) 0.44 - 1.00 mg/dL   Calcium 9.2 8.9 - 10.3 mg/dL   GFR calc non Af Amer 47 (L) >60 mL/min   GFR calc Af Amer 54 (L) >60 mL/min    Comment: (NOTE) The eGFR has been calculated using the CKD EPI equation. This calculation has not been validated in all clinical situations. eGFR's persistently <60 mL/min signify possible Chronic Kidney Disease.    Anion gap 9 5 - 15     Lipid Panel     Component Value Date/Time   CHOL 187 05/08/2015 0649   TRIG 84 05/08/2015 0649   HDL 41 05/08/2015 0649   CHOLHDL 4.6 05/08/2015 0649   VLDL 17 05/08/2015 0649   LDLCALC 129* 05/08/2015 0649     Lab Results  Component Value Date   HGBA1C 5.7* 05/08/2015     Lab Results  Component Value Date   LDLCALC 129* 05/08/2015   CREATININE 1.05* 05/10/2015    Vanessa Clayton is a 81 y.o. female with history of hypertension, hypothyroidism, chronic anemia, chronic kidney disease presenting with difficulty with speech, left facial droop, difficulty writing. She did not receive IV t-PA due to resolution of symptoms.   TIA with left MCA involvement most likely, less likely Seizure or complicated/neurlogic migraine Stroke: Incidental R paramedian splenium infarct not related to presenting symptoms secondary to small vessel disease source  Resultant Neurologic deficits resolved , patient is back to baseline  MRI Possible R paramedian splenium infarct vs artifact (does not correlate with presenting symptoms)  MRA No large vessel significant stenosis  Carotid Doppler unremarkable  2D Echo results as above, unremarkable  EEG - normal  LDL 129, patient started on pravastatin 20 mg a day  HgbA1c 5.7  Lovenox 30 mg sq daily for VTE prophylaxis  Diet Heart  healthy; Fluid consistency:: Thin  aspirin 81 mg  orally every day prior to admission, now on aspirin 325 mg orally every day.  Patient counseled to be compliant with her antithrombotic medications  Therapy recommendations: HH OT/PT    Essential Hypertension  Continue atenolol  Hyperlipidemia  Home meds: No statin  LDL 129, goal < 70  Started pravastatin 55m  Acute on chronic renal disease, stage II - III - Cr improving - repeat BMP in AM  Acute hyponatremia - drop in Na from 134 to 129, unclear etiology, stable for 2 days, patient started on fluid restriction Will need repeat BMP in 3-5 days  Other Active Problems  Hx L breast cancer  Hypothyroidism  Chronic anxiety   Hypothyroidism - Cont home synthroid dose, repeat TSH in the outpatient setting  Chronic anemia, IDA - no signs of active bleeding, hemoglobin 10.2 upon admission, 9.9 prior to discharge - continue iron supplementation  CBC outpatient  Severe PCM - nutritionist consulted   Discharge Exam:    Blood pressure 130/69, pulse 98, temperature 98.7 F (37.1 C), temperature source Oral, resp. rate 20, height '5\' 5"'  (1.651 m), weight 51.982 kg (114 lb 9.6 oz), SpO2 99 %.   General: Pt is alert, follows commands appropriately, not in acute distress  Cardiovascular: Regular rate and rhythm, no rubs, no gallops  Respiratory: Clear to auscultation bilaterally, no wheezing, no crackles, no rhonchi  Abdomen: Soft, non tender, non distended, bowel sounds present, no guarding  Extremities: No edema, pulses DP and PT palpable bilaterally  Neuro: Grossly nonfocal       Discharge Instructions    Ambulatory referral to Neurology    Complete by:  As directed   [facility]     Diet - low sodium heart healthy    Complete by:  As directed   1200 mL PO fluid restriction per day     Increase activity slowly    Complete by:  As directed            Follow-up Information    Follow up with  Xu,Jindong, MD. Schedule an appointment as soon as possible for a visit in 2 months.   Specialty:  Neurology   Why:  stroke clinic   Contact information:   [address] [phone]      Follow up with BWarren Danes MD. Schedule an appointment as soon as possible for a visit in 3 days.   Specialty:  Cardiology   Contact information:   [address] [id number] [phone]      Signed: AReyne Dumas9/04/2015, 10:40 AM        Time spent >45 mins

## 2015-05-10 NOTE — Progress Notes (Signed)
Patient is discharged from room 5C03 at this time. Alert and in stable condition. IV site d/c'd as well as tele. Instructions read to patient and daughter and understanding verbalized. Left unit via wheelchair with all belongings and family at side.

## 2015-05-11 ENCOUNTER — Telehealth: Payer: Self-pay | Admitting: Cardiology

## 2015-05-11 NOTE — Telephone Encounter (Signed)
Dr. Mare Ferrari reviewed chart and patient just needs lab follow up and keep ov as scheduled  Spoke with daughter and patient has nursing come out tomorrow and she is going to check to see if [facility] can get labs Daughter will call or have nurse call Monday am

## 2015-05-11 NOTE — Telephone Encounter (Signed)
New Message  Pt daughter requested to speak w/ Rip Harbour concerning pt's recent hospital stay. Pt has next avail appt for 9/30. Per AVS- pt is to be seen byDr Brackbill in 3 days. Pt daughter requested to speak w/ Rip Harbour.

## 2015-05-14 ENCOUNTER — Encounter: Payer: Self-pay | Admitting: Cardiology

## 2015-05-14 NOTE — Telephone Encounter (Signed)
This encounter was created in error - please disregard.

## 2015-05-14 NOTE — Telephone Encounter (Signed)
New message      [facility] to [facility] to [facility]. Nurse will come out tomorrow----but daughter does not know what test is needed.

## 2015-05-14 NOTE — Telephone Encounter (Signed)
T/O given to [facility] to get BMET and Kiana at 05/14/2015 2:26 PM     Status: Signed       Expand All Collapse All   New message      [facility] to [facility] to [facility] blood. Nurse will come out tomorrow----but daughter does not know what test is needed.

## 2015-05-16 ENCOUNTER — Encounter: Payer: Self-pay | Admitting: Cardiology

## 2015-05-18 ENCOUNTER — Telehealth: Payer: Self-pay | Admitting: Cardiology

## 2015-05-18 NOTE — Telephone Encounter (Signed)
Left message ok to re-certify

## 2015-05-18 NOTE — Telephone Encounter (Signed)
New message     Needs verbal order to re-certify physical therapy for 2 times a week for 4 weeks

## 2015-05-21 ENCOUNTER — Encounter: Payer: Self-pay | Admitting: Cardiology

## 2015-05-28 NOTE — Telephone Encounter (Signed)
Agree 

## 2015-06-01 ENCOUNTER — Ambulatory Visit (INDEPENDENT_AMBULATORY_CARE_PROVIDER_SITE_OTHER): Payer: Medicare PPO | Admitting: Cardiology

## 2015-06-01 ENCOUNTER — Encounter: Payer: Self-pay | Admitting: Cardiology

## 2015-06-01 VITALS — BP 128/80 | HR 85 | Ht 65.0 in | Wt 115.0 lb

## 2015-06-01 DIAGNOSIS — R634 Abnormal weight loss: Secondary | ICD-10-CM | POA: Diagnosis not present

## 2015-06-01 DIAGNOSIS — I119 Hypertensive heart disease without heart failure: Secondary | ICD-10-CM | POA: Diagnosis not present

## 2015-06-01 DIAGNOSIS — E7800 Pure hypercholesterolemia, unspecified: Secondary | ICD-10-CM

## 2015-06-01 NOTE — Patient Instructions (Signed)
Medication Instructions:  Your physician recommends that you continue on your current medications as directed. Please refer to the Current Medication list given to you today.  Labwork: none  Testing/Procedures: none  Follow-Up: Your physician recommends that you schedule a follow-up appointment in: 4 months with fasting labs (lp/bmet/hfp) with Cecille Rubin NP or Brynda Rim PA

## 2015-06-01 NOTE — Progress Notes (Signed)
Cardiology Office Note   Date:  06/01/2015   ID:  [name], [name] [birth year], MRN [id number]  PCP:  Warren Danes, MD  Cardiologist: Darlin Coco MD  No chief complaint on file.     History of Present Illness: Vanessa Clayton is a 81 y.o. female who presents for a four-month follow-up office visit. This pleasant 81 year old woman is seen for a scheduled 6 month followup office visit. She has a past history of essential hypertension and history of hypercholesterolemia. She has a remote history of thyrotoxicosis. She's also had a past history of breast cancer of the left breast. For the last year she has been having gradual persistent weight loss.At her last office visit because of weight loss we got a chest x-ray which was unremarkable.Recently her weight has stabilized and her appetite has improved. She attributes her weight loss and her poor appetite to the fact that she is under a lot of stress caring for her husband who has symptoms of Alzheimer's and dementia. She states that her PCP has done a lot of blood tests and no cause otherwise for her weight loss has been found. Her shortness of breath is unchanged. She had an echocardiogram in 07/04/13 showing an ejection fraction of 55-60% with grade 1 diastolic dysfunction.  Her recent lab work has been stable. No organic cause for the weight loss has been found. Actually her weight is up 1 pound since last checked. Her granddaughter has been bringing in food to her and she is also drinking one can of boost every day which has helped. 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  She has been getting physical therapy at home twice a week. She was hospitalized on 05/07/15 for possible TIA.  An echocardiogram on 05/08/15 showed moderate LVH and an ejection fraction of 78-29% and no embolic source.  She also had normal carotid ultrasound and a  normal EEG.  In the hospital she was started on statin in the form of pravastatin 20 mg daily.  Past Medical History  Diagnosis Date  . HTN (hypertension)   . Breast cancer     left  . Hypothyroidism     following treatment for hyperthyroidism  . Hyperthyroidism   . Chronic anemia   . Vitamin D deficiency   . Chronic anxiety     Past Surgical History  Procedure Laterality Date  . Breast lumpectomy  1998    left with radiation  . Knee arthroscopy  10/15/05  . Colon surgery       Current Outpatient Prescriptions  Medication Sig Dispense Refill  . acetaminophen (TYLENOL) 500 MG tablet Take 1,000 mg by mouth every 6 (six) hours as needed for moderate pain.    Marland Kitchen aspirin 325 MG tablet Take 1 tablet (325 mg total) by mouth daily. 60 tablet 1  . atenolol (TENORMIN) 50 MG tablet Take 0.5 tablets (25 mg total) by mouth daily. 45 tablet 3  . escitalopram (LEXAPRO) 10 MG tablet Take 10 mg by mouth daily.    . feeding supplement (BOOST HIGH PROTEIN) LIQD Take 1 Container by mouth 2 (two) times daily.    . Ferrous Sulfate (SLOW FE PO) Take 1 tablet by mouth daily.     Marland Kitchen levothyroxine (SYNTHROID, LEVOTHROID) 75 MCG tablet Take 75 mcg by mouth daily before breakfast.     . lidocaine (LMX) 4 % cream Apply 1 application  topically daily as needed (for knee and back pain).    . naproxen (NAPROSYN) 500 MG tablet Take 500 mg by mouth 2 (two) times daily.  1  . pravastatin (PRAVACHOL) 20 MG tablet Take 1 tablet (20 mg total) by mouth daily at 6 PM. 30 tablet 2  . Vitamin D, Ergocalciferol, (DRISDOL) 50000 UNITS CAPS 50,000 Units by Per NG tube route every 14 (fourteen) days.      No current facility-administered medications for this visit.    Allergies:   Penicillins    Social History:  The patient  reports that she has never smoked. She does not have any smokeless tobacco history on file. She reports that she does not drink alcohol or use illicit drugs.   Family History:  The patient's family  history includes Hypertension in her brother and mother. There is no history of Heart attack or Stroke.    ROS:  Please see the history of present illness.   Otherwise, review of systems are positive for none.   All other systems are reviewed and negative.    PHYSICAL EXAM: VS:  BP 128/80 mmHg  Pulse 85  Ht 5\' 5"  (1.651 m)  Wt 115 lb (52.164 kg)  BMI 19.14 kg/m2 , BMI Body mass index is 19.14 kg/(m^2). GEN: Well nourished, well developed, in no acute distress HEENT: normal Neck: no JVD, carotid bruits, or masses Cardiac: RRR; no murmurs, rubs, or gallops,no edema  Respiratory:  clear to auscultation bilaterally, normal work of breathing GI: soft, nontender, nondistended, + BS MS: no deformity or atrophy Skin: warm and dry, no rash Neuro:  Strength and sensation are intact Psych: euthymic mood, full affect   EKG:  EKG is ordered today. The ekg ordered today demonstrates normal sinus rhythm.  No ischemic changes.   Recent Labs: 03/19/2015: TSH 2.448 05/08/2015: ALT 7* 05/10/2015: BUN 21*; Creatinine, Ser 1.05*; Hemoglobin 9.9*; Platelets 258; Potassium 4.2; Sodium 129*    Lipid Panel    Component Value Date/Time   CHOL 187 05/08/2015 0649   TRIG 84 05/08/2015 0649   HDL 41 05/08/2015 0649   CHOLHDL 4.6 05/08/2015 0649   VLDL 17 05/08/2015 0649   LDLCALC 129* 05/08/2015 0649      Wt Readings from Last 3 Encounters:  06/01/15 115 lb (52.164 kg)  05/07/15 114 lb 9.6 oz (51.982 kg)  04/24/15 115 lb 12.8 oz (52.527 kg)        ASSESSMENT AND PLAN:  1. Unintentional weight loss, probably secondary to emotional stress from looking after her husband with dementia. The patient has good family support in town with a granddaughter who lives nearby. 2. history of hypercholesterolemia 3. diastolic dysfunction by echocardiogram 07/04/13 4. history of breast cancer 5. prior history of hyperthyroidism, followed by Dr. Elyse Hsu 6. History of TIA Sept 5, 2016.  Current  medicines are reviewed at length with the patient today.  The patient does not have concerns regarding medicines.  The following changes have been made:  no change  Labs/ tests ordered today include:   Orders Placed This Encounter  Procedures  . EKG 12-Lead   Disposition: Continue current medication.  Recheck in 4 months.  Get lipid panel hepatic function panel and basal metabolic panel then.  Berna Spare MD 06/01/2015 6:09 PM    [address] [place], [place], [place]  [address] Phone: [phone]; Fax: [phone]

## 2015-06-23 ENCOUNTER — Other Ambulatory Visit: Payer: Self-pay | Admitting: Cardiology

## 2015-07-10 ENCOUNTER — Encounter: Payer: Self-pay | Admitting: Cardiology

## 2015-07-13 ENCOUNTER — Encounter: Payer: Self-pay | Admitting: Cardiology

## 2015-07-16 ENCOUNTER — Ambulatory Visit (INDEPENDENT_AMBULATORY_CARE_PROVIDER_SITE_OTHER): Payer: Medicare PPO | Admitting: Neurology

## 2015-07-16 ENCOUNTER — Encounter: Payer: Self-pay | Admitting: Neurology

## 2015-07-16 VITALS — BP 138/80 | HR 80 | Ht 65.0 in | Wt 117.2 lb

## 2015-07-16 DIAGNOSIS — E785 Hyperlipidemia, unspecified: Secondary | ICD-10-CM

## 2015-07-16 DIAGNOSIS — G451 Carotid artery syndrome (hemispheric): Secondary | ICD-10-CM

## 2015-07-16 DIAGNOSIS — I10 Essential (primary) hypertension: Secondary | ICD-10-CM

## 2015-07-16 NOTE — Patient Instructions (Signed)
-   continue ASA and pravastatin for stroke prevention - check BP at home - Follow up with your primary care physician for stroke risk factor modification. Recommend maintain blood pressure goal <130/80, diabetes with hemoglobin A1c goal below 6.5% and lipids with LDL cholesterol goal below 70 mg/dL.  - regular exercise and healthy diet - follow up in 6 months

## 2015-07-16 NOTE — Progress Notes (Signed)
STROKE NEUROLOGY FOLLOW UP NOTE  NAME: Vanessa Clayton DOB: 11-25-1925  REASON FOR VISIT: stroke follow up HISTORY FROM: pt and chart  Today we had the pleasure of seeing Vanessa Clayton in follow-up at our Neurology Clinic. Pt was accompanied by no one.   History Summary Ms. Vanessa Clayton is a 81 y.o. female with history of hypertension, hypothyroidism, chronic anemia, chronic kidney disease was admitted on 05/07/15 for transient difficulty with speech, left facial droop, difficulty writing. She did not receive IV t-PA due to resolution of symptoms. MRI showed right paramedian splenium infarct likely not related to her presenting symptoms. She likely had TIA with left MCA involvement. Stroke work up including MRA, CUS, TTE, EEG, and A1C unremarkable. LDL 129. She was discharged in good condition with ASA 325mg  and pravastatin 20mg .   Interval History During the interval time, the patient has been doing well. No recurrent stroke like symptoms. Continued on ASA 325mg  and pravastatin without side effect. Followed up with cardiologist Dr. Mare Ferrari and no change of meds. BP today 138/80. On atenolol 25mg  daily. Still taking care of demented husband at home. Will see PCP in 09/2015.   REVIEW OF SYSTEMS: Full 14 system review of systems performed and notable only for those listed below and in HPI above, all others are negative:  Constitutional:   Cardiovascular:  Ear/Nose/Throat:   Skin:  Eyes:  Blurry vision Respiratory:   Gastroitestinal:  Incontinence, constipation Genitourinary:  Hematology/Lymphatic:  anemia Endocrine:  Musculoskeletal:  Joint pain Allergy/Immunology:   Neurological:  Confusion, weakness, slurred speech Psychiatric: anxiety, change in appetite Sleep: insomnia  The following represents the patient's updated allergies and side effects list: Allergies  Allergen Reactions  . Penicillins     Rash     The neurologically relevant items on the patient's problem list were  reviewed on today's visit.  Neurologic Examination  A problem focused neurological exam (12 or more points of the single system neurologic examination, vital signs counts as 1 point, cranial nerves count for 8 points) was performed.  Blood pressure 138/80, pulse 80, height 5\' 5"  (1.651 m), weight 117 lb 3.2 oz (53.162 kg).  General - Well nourished, well developed, in no apparent distress.  Ophthalmologic - Fundi not visualized due to eye movement.  Cardiovascular - Regular rate and rhythm.  Mental Status -  Level of arousal and orientation to time, place, and person were intact. Language including expression, naming, repetition, comprehension was assessed and found intact. Fund of Knowledge was assessed and was intact.  Cranial Nerves II - XII - II - Visual field intact OU. III, IV, VI - Extraocular movements intact. V - Facial sensation intact bilaterally. VII - Facial movement intact bilaterally. VIII - Hearing & vestibular intact bilaterally. X - Palate elevates symmetrically. XI - Chin turning & shoulder shrug intact bilaterally. XII - Tongue protrusion intact.  Motor Strength - The patient's strength was normal in all extremities and pronator drift was absent.  Bulk was normal and fasciculations were absent.   Motor Tone - Muscle tone was assessed at the neck and appendages and was normal.  Reflexes - The patient's reflexes were 1+ in all extremities and she had no pathological reflexes.  Sensory - Light touch, temperature/pinprick were assessed and were normal.    Coordination - The patient had normal movements in the hands and feet with no ataxia or dysmetria.  Tremor was absent.  Gait and Station - walk with cane, steady and no fall tendency.  Data reviewed: I personally reviewed the images and agree with the radiology interpretations.  Ct Head Wo Contrast 05/07/2015 No acute intracranial pathology. Age-related atrophy and chronic microvascular ischemic disease.    MRI HEAD  05/08/2015 1. Punctate focus of high signal intensity on axial DWI sequence within the right paramedian splenium. This finding is favored to be artifactual in nature, although possible tiny focus of acute ischemia is not entirely excluded. Correlation with symptomatology and history recommended. 2. No other acute intracranial infarct or other process identified. 3. Generalized age-related cerebral atrophy with mild chronic small vessel ischemic disease.   MRA HEAD  05/08/2015 1. No proximal or large arterial branch occlusion within the intracranial circulation. 2. Short-segment moderate stenosis within the supraclinoid left ICA. 3. Mild to moderate short-segment stenosis of the proximal basilar artery. 4. No other significant or focal stenosis identified within the intracranial circulation.   2D echo - - Left ventricle: The cavity size was normal. Wall thickness wasincreased in a pattern of moderate LVH. Systolic function wasnormal. The estimated ejection fraction was in the range of 60%to 65%. Wall motion was normal; there were no regional wallmotion abnormalities. Doppler parameters are consistent withabnormal left ventricular relaxation (grade 1 diastolicdysfunction). - Aortic valve: Trileaflet; mildly thickened, mildly calcified leaflets. - Tricuspid valve: There was moderate regurgitation. - Pulmonary arteries: Systolic pressure was mildly increased. PApeak pressure: 31 mm Hg (S). - Pericardium, extracardiac: A small pericardial effusion was identified along the right ventricular free wall. There was no evidence of hemodynamic compromise Impressions: Prior echocardiogram from 2014 demonstrated trivial anterior/ RVfree wall pericardial effusion which now has mildly increased. No cardiac source of emboli was indentified.  CUS - Bilateral: 1-39% ICA stenosis. Vertebral artery flow is antegrade.  EEG - This normal EEG is recorded in the waking state. There was  no seizure or seizure predisposition recorded on this study.   Component     Latest Ref Rng 01/06/2014 03/19/2015 05/08/2015 05/09/2015  Cholesterol     0 - 200 mg/dL 142  187   Triglycerides     <150 mg/dL 58.0  84   HDL Cholesterol     >40 mg/dL 50.10  41   VLDL     0 - 40 mg/dL 11.6  17   LDL (calc)     0 - 99 mg/dL 80  129 (H)   Total CHOL/HDL Ratio      3  4.6   Hemoglobin A1C     4.8 - 5.6 %   5.7 (H)   Mean Plasma Glucose        117   TSH     0.350 - 4.500 uIU/mL  2.448    Vitamin B-12     180 - 914 pg/mL    473    Assessment: As you may recall, she is a 81 y.o. African American female with PMH of hypertension, hypothyroidism, chronic kidney disease was admitted on 05/07/15 for transient difficulty with speech, left facial droop, difficulty writing. MRI showed right paramedian splenium punctate infarct likely not related to her presenting symptoms. She likely had TIA with left MCA involvement. Stroke work up including MRA, CUS, TTE, EEG, and A1C unremarkable. LDL 129. She was discharged in good condition with ASA 325mg  and pravastatin 20mg . During the interval time, the patient has been doing well. No recurrent stroke like symptoms. Continued on ASA 325mg  and pravastatin without side effect.   Plan:  - continue ASA and pravastatin for stroke prevention - check BP at home -  Follow up with your primary care physician for stroke risk factor modification. Recommend maintain blood pressure goal <130/80, diabetes with hemoglobin A1c goal below 6.5% and lipids with LDL cholesterol goal below 70 mg/dL.  - regular exercise and healthy diet - follow up in 6 months  No orders of the defined types were placed in this encounter.    No orders of the defined types were placed in this encounter.    Patient Instructions  - continue ASA and pravastatin for stroke prevention - check BP at home - Follow up with your primary care physician for stroke risk factor modification. Recommend maintain  blood pressure goal <130/80, diabetes with hemoglobin A1c goal below 6.5% and lipids with LDL cholesterol goal below 70 mg/dL.  - regular exercise and healthy diet - follow up in 6 months    Rosalin Hawking, MD PhD [facility] [address], [place], [place] [address] [phone]

## 2015-08-16 ENCOUNTER — Ambulatory Visit: Payer: Medicare PPO | Admitting: Cardiology

## 2015-09-06 ENCOUNTER — Encounter: Payer: Self-pay | Admitting: Cardiology

## 2015-09-06 ENCOUNTER — Ambulatory Visit (INDEPENDENT_AMBULATORY_CARE_PROVIDER_SITE_OTHER): Payer: Medicare Other | Admitting: Cardiology

## 2015-09-06 VITALS — BP 160/90 | HR 75 | Ht 65.0 in | Wt 119.8 lb

## 2015-09-06 DIAGNOSIS — R634 Abnormal weight loss: Secondary | ICD-10-CM

## 2015-09-06 DIAGNOSIS — I119 Hypertensive heart disease without heart failure: Secondary | ICD-10-CM

## 2015-09-06 DIAGNOSIS — E7800 Pure hypercholesterolemia, unspecified: Secondary | ICD-10-CM | POA: Diagnosis not present

## 2015-09-06 DIAGNOSIS — I951 Orthostatic hypotension: Secondary | ICD-10-CM | POA: Diagnosis not present

## 2015-09-06 NOTE — Patient Instructions (Signed)
Medication Instructions:  Your physician recommends that you continue on your current medications as directed. Please refer to the Current Medication list given to you today.  Labwork: none  Testing/Procedures: none  Follow-Up: Your physician wants you to follow-up in: 6 month ov with Dr Glennon Hamilton will receive a reminder letter in the mail two months in advance. If you don't receive a letter, please call our office to schedule the follow-up appointment.  Any Other Special Instructions Will Be Listed Below (If Applicable). MAKE SURE YOU CALL AND GET A PRIMARY CARE DOCTOR AS SOON AS POSSIBLE   If you need a refill on your cardiac medications before your next appointment, please call your pharmacy.

## 2015-09-06 NOTE — Progress Notes (Signed)
Cardiology Office Note   Date:  09/06/2015   ID:  Vanessa Clayton, DOB 01-16-1926, MRN [id number]  PCP:  Warren Danes, MD  Cardiologist: Darlin Coco MD  No chief complaint on file.     History of Present Illness: Vanessa Clayton is a 82 y.o. female who presents for scheduled four-month follow-up visit   This pleasant 82 year old woman is seen for a scheduled 6 month followup office visit. She has a past history of essential hypertension and history of hypercholesterolemia. She has a remote history of thyrotoxicosis. She's also had a past history of breast cancer of the left breast. For the last year she has been having gradual persistent weight loss.At her last office visit because of weight loss we got a chest x-ray which was unremarkable.Recently her weight has stabilized and her appetite has improved. She had an echocardiogram in 07/04/13 showing an ejection fraction of 55-60% with grade 1 diastolic dysfunction.  Her recent lab work has been stable. No organic cause for the weight loss has been found. Actually her weight is up 2 pounds since last checked. Her granddaughter has been bringing in food to her and she is also drinking one can of boost every day which has helped. 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 She has been getting physical therapy at home twice a week. She was hospitalized on 05/07/15 for possible TIA. An echocardiogram on 05/08/15 showed moderate LVH and an ejection fraction of 123456 and no embolic source. She also had normal carotid ultrasound and a normal EEG. In the hospital she was started on statin in the form of pravastatin 20 mg daily.  She is tolerating the statin without adverse side effects  Past Medical History  Diagnosis Date  . HTN (hypertension)   . Breast cancer ([place])     left  . Hypothyroidism     following treatment for hyperthyroidism  .  Hyperthyroidism   . Chronic anemia   . Vitamin D deficiency   . Chronic anxiety   . Syncope and collapse     Past Surgical History  Procedure Laterality Date  . Breast lumpectomy  1998    left with radiation  . Knee arthroscopy  10/15/05  . Colon surgery       Current Outpatient Prescriptions  Medication Sig Dispense Refill  . acetaminophen (TYLENOL) 500 MG tablet Take 1,000 mg by mouth every 6 (six) hours as needed for moderate pain.    Marland Kitchen aspirin 325 MG tablet Take 1 tablet (325 mg total) by mouth daily. 60 tablet 1  . atenolol (TENORMIN) 50 MG tablet Take 0.5 tablets (25 mg total) by mouth daily. 45 tablet 3  . escitalopram (LEXAPRO) 10 MG tablet Take 10 mg by mouth daily.    . feeding supplement (BOOST HIGH PROTEIN) LIQD Take 1 Container by mouth 2 (two) times daily.    . Ferrous Sulfate (SLOW FE PO) Take 1 tablet by mouth daily.     Marland Kitchen levothyroxine (SYNTHROID, LEVOTHROID) 75 MCG tablet Take 75 mcg by mouth daily before breakfast.     . pravastatin (PRAVACHOL) 20 MG tablet Take 1 tablet (20 mg total) by mouth daily at 6 PM. 30 tablet 2  . Vitamin D, Ergocalciferol, (DRISDOL) 50000 UNITS CAPS Take 50,000 Units by mouth every 14 (fourteen) days.      No current facility-administered medications for this visit.    Allergies:  Penicillins    Social History:  The patient  reports that she has never smoked. She does not have any smokeless tobacco history on file. She reports that she does not drink alcohol or use illicit drugs.   Family History:  The patient's family history includes Hypertension in her brother and mother. There is no history of Heart attack or Stroke.    ROS:  Please see the history of present illness.   Otherwise, review of systems are positive for none.   All other systems are reviewed and negative.    PHYSICAL EXAM: VS:  BP 160/90 mmHg  Pulse 75  Ht 5\' 5"  (1.651 m)  Wt 119 lb 12.8 oz (54.341 kg)  BMI 19.94 kg/m2 , BMI Body mass index is 19.94  kg/(m^2). GEN: Well nourished, well developed, in no acute distress HEENT: normal Neck: no JVD, carotid bruits, or masses Cardiac: RRR; no murmurs, rubs, or gallops,no edema  Respiratory:  clear to auscultation bilaterally, normal work of breathing GI: soft, nontender, nondistended, + BS MS: no deformity or atrophy Skin: warm and dry, no rash Neuro:  Strength and sensation are intact Psych: euthymic mood, full affect   EKG:  EKG is not ordered today.    Recent Labs: 03/19/2015: TSH 2.448 05/08/2015: ALT 7* 05/10/2015: BUN 21*; Creatinine, Ser 1.05*; Hemoglobin 9.9*; Platelets 258; Potassium 4.2; Sodium 129*    Lipid Panel    Component Value Date/Time   CHOL 187 05/08/2015 0649   TRIG 84 05/08/2015 0649   HDL 41 05/08/2015 0649   CHOLHDL 4.6 05/08/2015 0649   VLDL 17 05/08/2015 0649   LDLCALC 129* 05/08/2015 0649      Wt Readings from Last 3 Encounters:  09/06/15 119 lb 12.8 oz (54.341 kg)  07/16/15 117 lb 3.2 oz (53.162 kg)  06/01/15 115 lb (52.164 kg)        ASSESSMENT AND PLAN:  1. Unintentional weight loss, probably secondary to emotional stress from looking after her husband with dementia. The patient has good family support in town with a granddaughter who lives nearby.  Since last visit her weight has stabilized and has improved slightly. 2. history of hypercholesterolemia.  Tolerating pravastatin 3. diastolic dysfunction by echocardiogram 07/04/13 4. history of breast cancer 5. prior history of hyperthyroidism, followed by Dr. Elyse Clayton 6. History of TIA Sept 5, 2016.   Current medicines are reviewed at length with the patient today.  The patient does not have concerns regarding medicines.  The following changes have been made:  no change  Labs/ tests ordered today include:  No orders of the defined types were placed in this encounter.    Disposition: The patient will continue current medication.  I told her that she would need to have a PCP.  Continue  to follow-up with Dr. Elyse Clayton for her endocrine issues.  After my retirement she will follow-up with Dr. Irish Clayton for her heart.  Berna Spare MD 09/06/2015 2:24 PM    [place], [place], [place]  [address] Phone: [phone]; Fax: [phone]

## 2015-09-07 ENCOUNTER — Other Ambulatory Visit: Payer: Self-pay | Admitting: Cardiology

## 2015-09-09 ENCOUNTER — Other Ambulatory Visit: Payer: Self-pay | Admitting: Cardiology

## 2015-09-30 NOTE — Progress Notes (Signed)
Cardiology Office Note:    Date:  10/01/2015   ID:  Vanessa Clayton, DOB Jun 03, 1926, MRN [id number]  PCP:  Warren Danes, MD  Cardiologist:  Dr. Darlin Coco  >> Dr. Casandra Doffing  Electrophysiologist:  n/a  Chief Complaint  Patient presents with  . Hypertensive Heart Disease    Follow-up    History of Present Illness:    Vanessa Clayton is a 82 y.o. female with a hx of HTN, HL, remote thyrotoxicosis, breast CA.  She had recently c/o weight loss.  She was admitted in 9/16 with possible TIA.  Last seen by Dr. Darlin Coco 09/06/15.      Past Medical History  Diagnosis Date  . HTN (hypertension)   . Breast cancer ([place])     left  . Hypothyroidism     following treatment for hyperthyroidism  . Hyperthyroidism   . Chronic anemia   . Vitamin D deficiency   . Chronic anxiety   . Syncope and collapse   . History of TIA (transient ischemic attack) 10/01/2015    Past Surgical History  Procedure Laterality Date  . Breast lumpectomy  1998    left with radiation  . Knee arthroscopy  10/15/05  . Colon surgery      Current Medications: Outpatient Prescriptions Prior to Visit  Medication Sig Dispense Refill  . acetaminophen (TYLENOL) 500 MG tablet Take 1,000 mg by mouth every 6 (six) hours as needed for moderate pain.    Marland Kitchen aspirin 325 MG tablet Take 1 tablet (325 mg total) by mouth daily. 60 tablet 1  . atenolol (TENORMIN) 50 MG tablet Take 0.5 tablets (25 mg total) by mouth daily. 45 tablet 3  . atenolol (TENORMIN) 50 MG tablet TAKE 1/2 TABLET(25 MG) BY MOUTH DAILY 45 tablet 3  . escitalopram (LEXAPRO) 10 MG tablet Take 10 mg by mouth daily.    . feeding supplement (BOOST HIGH PROTEIN) LIQD Take 1 Container by mouth 2 (two) times daily.    . Ferrous Sulfate (SLOW FE PO) Take 1 tablet by mouth daily.     Marland Kitchen levothyroxine (SYNTHROID, LEVOTHROID) 75 MCG tablet Take 75 mcg by mouth daily before breakfast.     . pravastatin (PRAVACHOL) 20 MG tablet Take 1 tablet (20 mg total) by  mouth daily at 6 PM. 30 tablet 2  . Vitamin D, Ergocalciferol, (DRISDOL) 50000 UNITS CAPS Take 50,000 Units by mouth every 14 (fourteen) days.      No facility-administered medications prior to visit.     Allergies:   Penicillins   Social History   Social History  . Marital Status: Married    Spouse Name: N/A  . Number of Children: N/A  . Years of Education: N/A   Social History Main Topics  . Smoking status: Never Smoker   . Smokeless tobacco: Not on file  . Alcohol Use: No  . Drug Use: No  . Sexual Activity: Not on file   Other Topics Concern  . Not on file   Social History Narrative     Family History:  The patient's family history includes Hypertension in her brother and mother. There is no history of Heart attack or Stroke.   ROS:   Please see the history of present illness.    ROS All other systems reviewed and are negative.   Physical Exam:    VS:  There were no vitals taken for this visit.   GEN: Well nourished, well developed, in no acute distress HEENT: normal  Neck: no JVD, no masses Cardiac: Normal S1/S2, RRR; no murmurs, rubs, or gallops, no edema;   carotid bruits,   Respiratory:  clear to auscultation bilaterally; no wheezing, rhonchi or rales GI: soft, nontender, nondistended, + BS MS: no deformity or atrophy Skin: warm and dry, no rash Neuro:  Bilateral strength equal, no focal deficits  Psych: Alert and oriented x 3, normal affect  Wt Readings from Last 3 Encounters:  09/06/15 119 lb 12.8 oz (54.341 kg)  07/16/15 117 lb 3.2 oz (53.162 kg)  06/01/15 115 lb (52.164 kg)      Studies/Labs Reviewed:    EKG:  EKG is  ordered today.  The ekg ordered today demonstrates   Recent Labs: 03/19/2015: TSH 2.448 05/08/2015: ALT 7* 05/10/2015: BUN 21*; Creatinine, Ser 1.05*; Hemoglobin 9.9*; Platelets 258; Potassium 4.2; Sodium 129*   Recent Lipid Panel    Component Value Date/Time   CHOL 187 05/08/2015 0649   TRIG 84 05/08/2015 0649   HDL 41  05/08/2015 0649   CHOLHDL 4.6 05/08/2015 0649   VLDL 17 05/08/2015 0649   LDLCALC 129* 05/08/2015 0649    Additional studies/ records that were reviewed today include:   Carotid US 05/09/15 Bilateral - 1% to 39 % ICA stenosis  Echo 05/08/15 Mod LVH, EF 60-65%, no RWMA, Gr 1 DD, mod TR, PASP 31 mmHg, small pericardial eff along RV free wall    ASSESSMENT:    1. Benign hypertensive heart disease without heart failure   2. Diastolic dysfunction   3. Hypercholesterolemia   4. History of TIA (transient ischemic attack)     PLAN:    In order of problems listed above:  1. HTN Heart Disease -   2. Diastolic Dysfunction -   3. HL -   4. Hx of TIA -     Medication Adjustments/Labs and Tests Ordered: Current medicines are reviewed at length with the patient today.  Concerns regarding medicines are outlined above.  Medication changes, Labs and Tests ordered today are outlined in the Patient Instructions noted below. There are no Patient Instructions on file for this visit.   Signed, Richardson Dopp, PA-C  10/01/2015 8:58 AM    [facility] [address], [place], [place]  [address] Phone: [phone]; Fax: [phone]     This encounter was created in error - please disregard.

## 2015-10-01 ENCOUNTER — Encounter: Payer: Self-pay | Admitting: Physician Assistant

## 2015-10-01 ENCOUNTER — Encounter: Payer: Medicare PPO | Admitting: Physician Assistant

## 2015-10-01 ENCOUNTER — Other Ambulatory Visit (INDEPENDENT_AMBULATORY_CARE_PROVIDER_SITE_OTHER): Payer: Medicare Other | Admitting: *Deleted

## 2015-10-01 DIAGNOSIS — I119 Hypertensive heart disease without heart failure: Secondary | ICD-10-CM | POA: Diagnosis not present

## 2015-10-01 DIAGNOSIS — Z8673 Personal history of transient ischemic attack (TIA), and cerebral infarction without residual deficits: Secondary | ICD-10-CM

## 2015-10-01 DIAGNOSIS — E7800 Pure hypercholesterolemia, unspecified: Secondary | ICD-10-CM | POA: Diagnosis not present

## 2015-10-01 DIAGNOSIS — I5189 Other ill-defined heart diseases: Secondary | ICD-10-CM | POA: Insufficient documentation

## 2015-10-01 HISTORY — DX: Other ill-defined heart diseases: I51.89

## 2015-10-01 HISTORY — DX: Personal history of transient ischemic attack (TIA), and cerebral infarction without residual deficits: Z86.73

## 2015-10-01 LAB — HEPATIC FUNCTION PANEL
ALK PHOS: 48 U/L (ref 33–130)
ALT: 23 U/L (ref 6–29)
AST: 21 U/L (ref 10–35)
Albumin: 3.8 g/dL (ref 3.6–5.1)
BILIRUBIN DIRECT: 0.2 mg/dL (ref <=0.2)
BILIRUBIN INDIRECT: 0.5 mg/dL (ref 0.2–1.2)
TOTAL PROTEIN: 6 g/dL — AB (ref 6.1–8.1)
Total Bilirubin: 0.7 mg/dL (ref 0.2–1.2)

## 2015-10-01 LAB — LIPID PANEL
CHOLESTEROL: 106 mg/dL — AB (ref 125–200)
HDL: 44 mg/dL — ABNORMAL LOW (ref >=46)
LDL Cholesterol: 46 mg/dL (ref <130)
TRIGLYCERIDES: 80 mg/dL (ref <150)
Total CHOL/HDL Ratio: 2.4 Ratio (ref <=5.0)
VLDL: 16 mg/dL (ref <30)

## 2015-10-01 LAB — BASIC METABOLIC PANEL
BUN: 15 mg/dL (ref 7–25)
CALCIUM: 8.7 mg/dL (ref 8.6–10.4)
CO2: 28 mmol/L (ref 20–31)
CREATININE: 1 mg/dL — AB (ref 0.60–0.88)
Chloride: 106 mmol/L (ref 98–110)
Glucose, Bld: 94 mg/dL (ref 65–99)
POTASSIUM: 4 mmol/L (ref 3.5–5.3)
Sodium: 143 mmol/L (ref 135–146)

## 2015-10-01 NOTE — Addendum Note (Signed)
Addended by: Eulis Foster on: 10/01/2015 10:58 AM   Modules accepted: Orders

## 2015-10-01 NOTE — Addendum Note (Signed)
Addended by: Eulis Foster on: 10/01/2015 10:59 AM   Modules accepted: Orders

## 2015-10-02 NOTE — Progress Notes (Signed)
Quick Note:  Please report to patient. The recent labs are stable. Continue same medication and careful diet. ______ 

## 2015-10-06 ENCOUNTER — Encounter: Payer: Self-pay | Admitting: Cardiology

## 2015-10-12 ENCOUNTER — Other Ambulatory Visit: Payer: Self-pay | Admitting: *Deleted

## 2015-10-12 MED ORDER — PRAVASTATIN SODIUM 20 MG PO TABS: 20.0000 mg | ORAL_TABLET | Freq: Every day | ORAL | Status: DC

## 2015-10-12 NOTE — Telephone Encounter (Signed)
Ok to refill  Pravastatin per Dr. Mare Ferrari  Received my chart message from patient

## 2015-10-22 ENCOUNTER — Ambulatory Visit: Payer: Medicare PPO | Admitting: Neurology

## 2015-12-04 ENCOUNTER — Ambulatory Visit: Payer: Medicare PPO | Admitting: Neurology

## 2015-12-14 IMAGING — CT CT HEAD W/O CM
2 series · 15 of 30 positions shown, 19 images · non-contrast
Comparison: None.

CLINICAL DATA: 88-year-old female with left facial foci concern for
stroke.

EXAM:
CT HEAD WITHOUT CONTRAST
TECHNIQUE: Contiguous axial images were obtained from the base of the skull
through the vertex without intravenous contrast.

[Series 201: head w/o, idose (1) · axial · non-contrast · 0.42mm/px · z∈[+96,+216]mm · 13 of 30 slices shown, 17 images]
[im 3/30  brain]
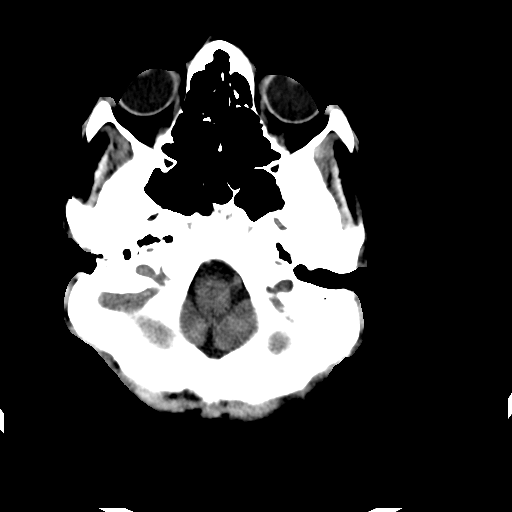
[im 3/30  bone]
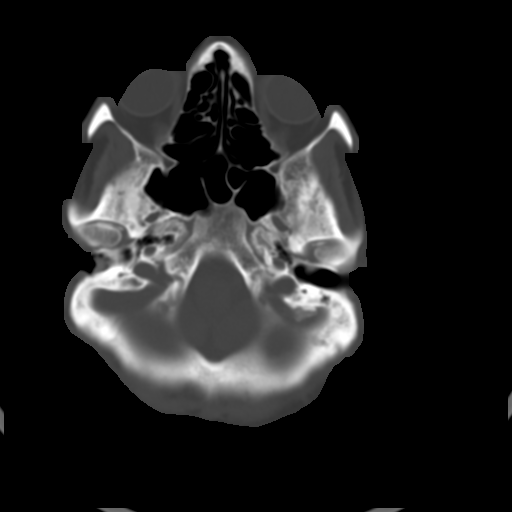
[im 5/30  brain]
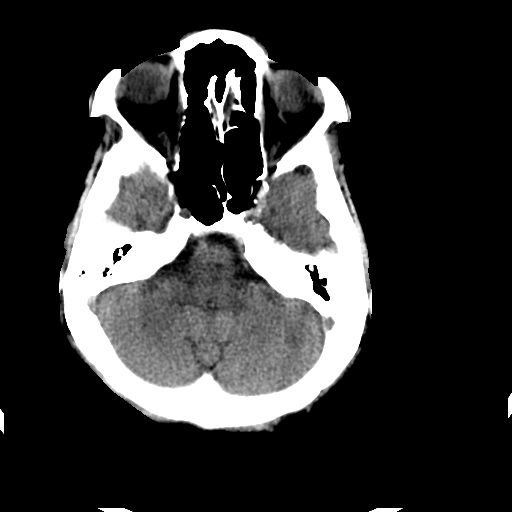
[im 7/30  brain]
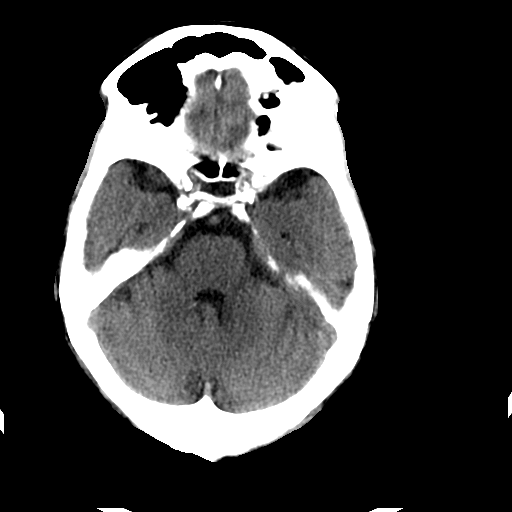
[im 9/30  brain]
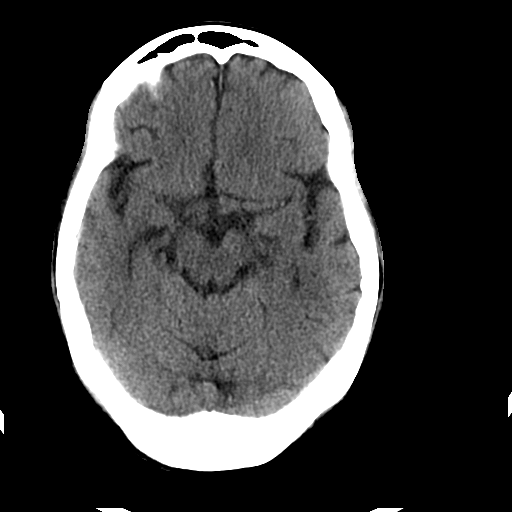
[im 11/30  brain]
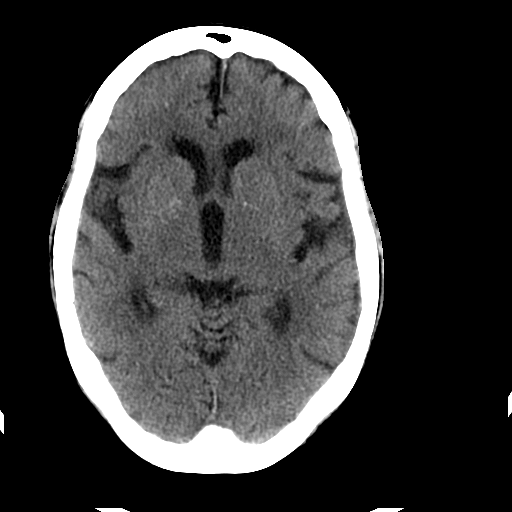
[im 11/30  bone]
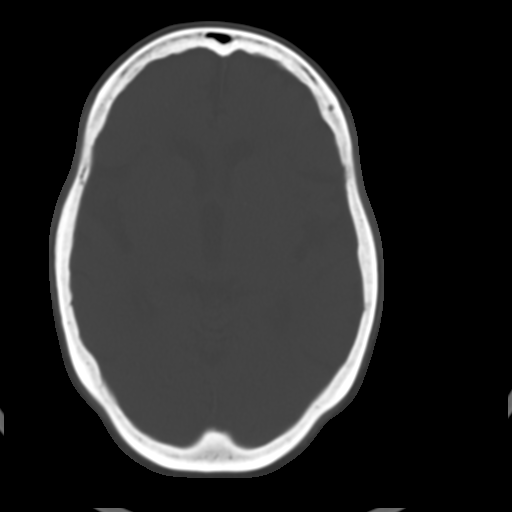
[im 13/30  brain]
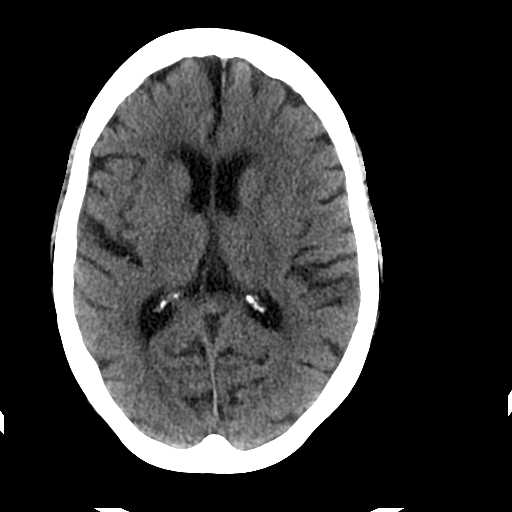
[im 15/30  brain]
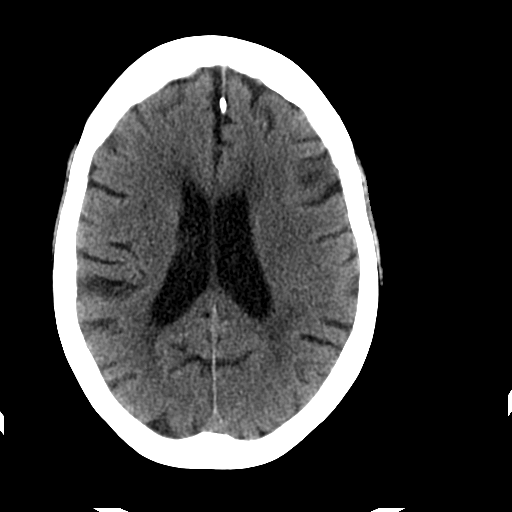
[im 17/30  brain]
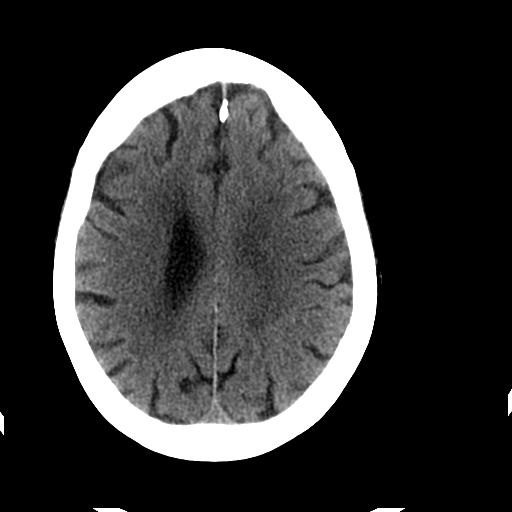
[im 19/30  brain]
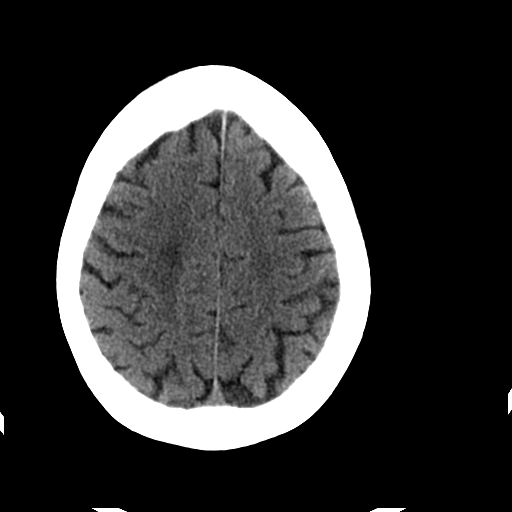
[im 19/30  bone]
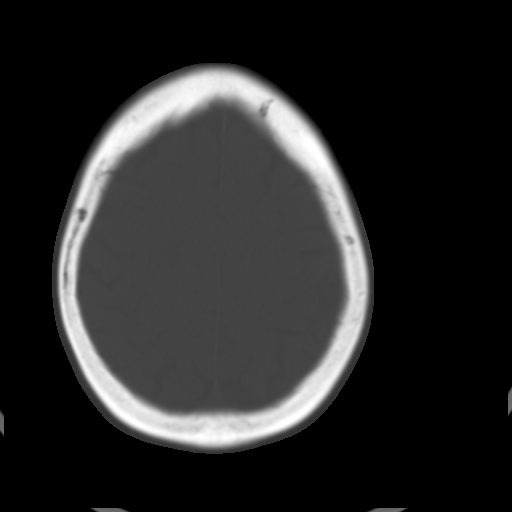
[im 21/30  brain]
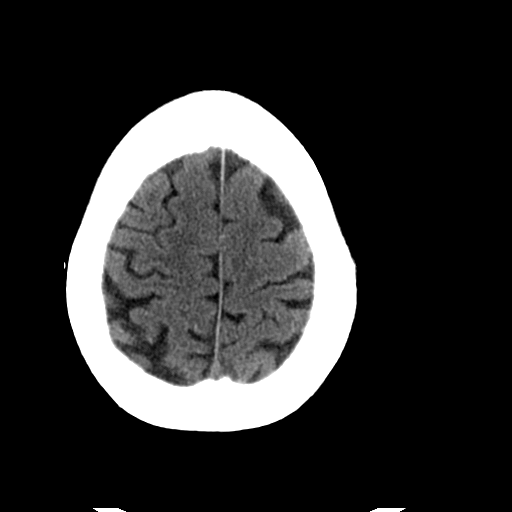
[im 23/30  brain]
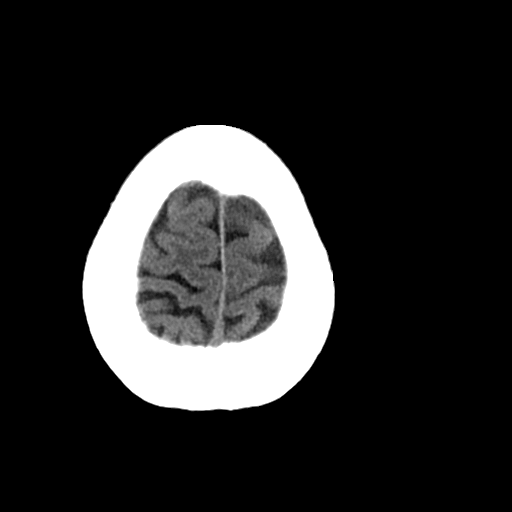
[im 25/30  brain]
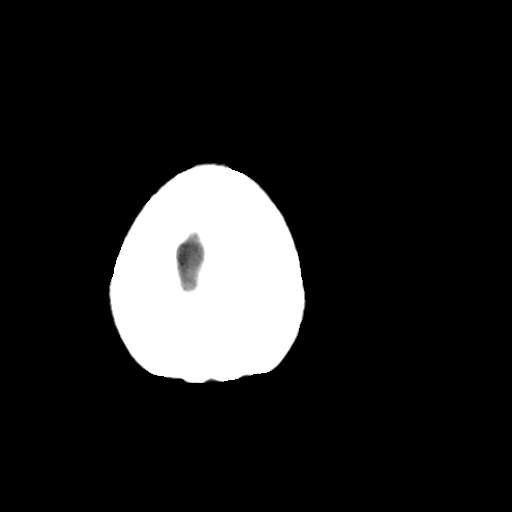
[im 27/30  brain]
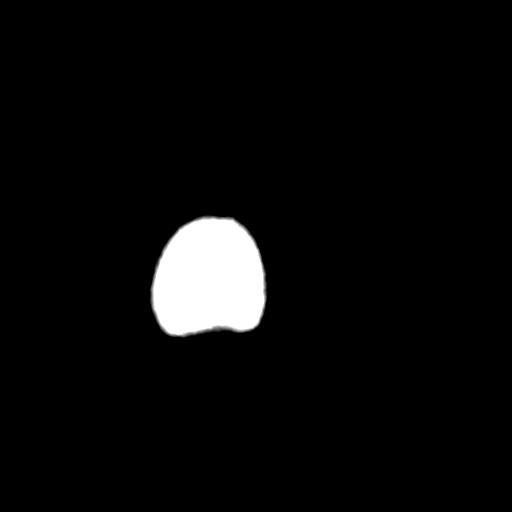
[im 27/30  bone]
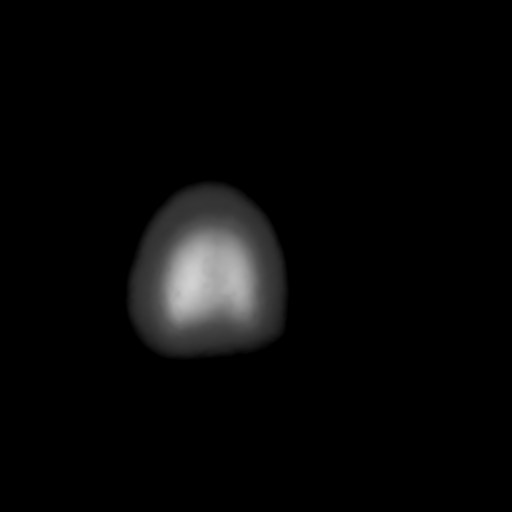

[Series 202: head w/o bone, idose (1) · axial · non-contrast · 0.42mm/px · z∈[+96,+116]mm · 2 of 30 slices shown]
[im 3/30  bone]
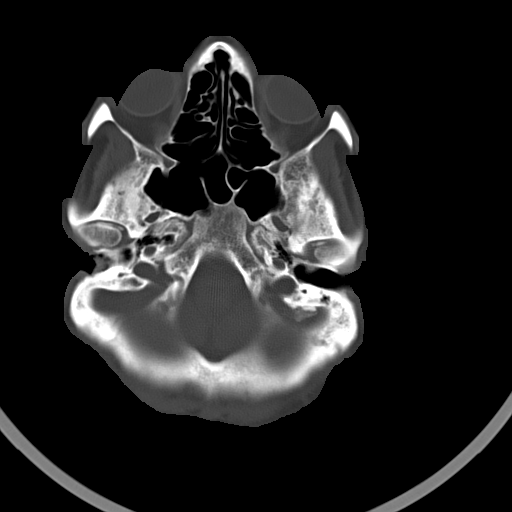
[im 7/30  bone]
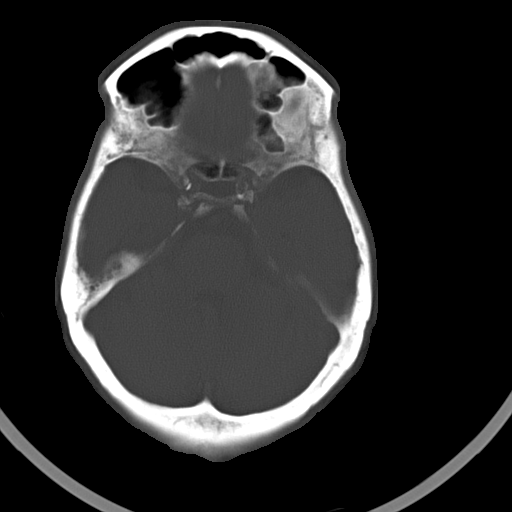

[15 of 30 positions shown; findings below may reference images not displayed]

FINDINGS: The ventricles are dilated and the sulci are prominent compatible
with age-related atrophy. Periventricular and deep white matter
hypodensities represent chronic microvascular ischemic changes.
There is no intracranial hemorrhage. No mass effect or midline shift
identified.

The visualized paranasal sinuses and mastoid air cells are well
aerated. The calvarium is intact.
IMPRESSION: No acute intracranial pathology.

Age-related atrophy and chronic microvascular ischemic disease.

If symptoms persist and there are no contraindications, MRI may
provide better evaluation if clinically indicated.

These results were called by telephone at the time of interpretation
on 05/07/2015 at [DATE] to Dr. ARDEN ATKINSON , who verbally acknowledged
these results.

## 2015-12-21 ENCOUNTER — Ambulatory Visit (INDEPENDENT_AMBULATORY_CARE_PROVIDER_SITE_OTHER): Payer: Medicare Other | Admitting: Neurology

## 2015-12-21 ENCOUNTER — Encounter: Payer: Self-pay | Admitting: Neurology

## 2015-12-21 VITALS — BP 157/86 | HR 82 | Ht 65.0 in | Wt 120.2 lb

## 2015-12-21 DIAGNOSIS — I10 Essential (primary) hypertension: Secondary | ICD-10-CM | POA: Diagnosis not present

## 2015-12-21 DIAGNOSIS — G451 Carotid artery syndrome (hemispheric): Secondary | ICD-10-CM | POA: Diagnosis not present

## 2015-12-21 DIAGNOSIS — E785 Hyperlipidemia, unspecified: Secondary | ICD-10-CM | POA: Diagnosis not present

## 2015-12-21 HISTORY — DX: Hyperlipidemia, unspecified: E78.5

## 2015-12-21 HISTORY — DX: Essential (primary) hypertension: I10

## 2015-12-21 NOTE — Progress Notes (Signed)
STROKE NEUROLOGY FOLLOW UP NOTE  NAME: Vanessa Clayton DOB: 1925-11-17  REASON FOR VISIT: stroke follow up HISTORY FROM: pt and chart  Today we had the pleasure of seeing Vanessa Clayton in follow-up at our Neurology Clinic. Pt was accompanied by no one.   History Summary Vanessa Clayton is a 82 y.o. female with history of hypertension, hypothyroidism, chronic anemia, chronic kidney disease was admitted on 05/07/15 for transient difficulty with speech, left facial droop, difficulty writing. She did not receive IV t-PA due to resolution of symptoms. MRI showed right paramedian splenium infarct likely not related to her presenting symptoms. She likely had TIA with left MCA involvement. Stroke work up including MRA, CUS, TTE, EEG, and A1C unremarkable. LDL 129. She was discharged in good condition with ASA 325mg  and pravastatin 20mg .   07/16/15 follow up - the patient has been doing well. No recurrent stroke like symptoms. Continued on ASA 325mg  and pravastatin without side effect. Followed up with cardiologist Dr. Mare Ferrari and no change of meds. BP today 138/80. On atenolol 25mg  daily. Still taking care of demented husband at home. Will see PCP in 09/2015.   Interval History During the interval time, pt has been doing well. No stroke like symptoms. Taking care of her dementia husband at home. Followed with cardiology no change of meds. BP today 157/86 but she forgot to take atenolol this morning. Otherwise, no complains.   REVIEW OF SYSTEMS: Full 14 system review of systems performed and notable only for those listed below and in HPI above, all others are negative:  Constitutional:  fatigue Cardiovascular:  Ear/Nose/Throat:   Skin: itching Eyes:  Double vision, light sensitivity Respiratory:   Gastroitestinal: constipation Genitourinary: frequent urination, incontinence of bladder Hematology/Lymphatic:  anemia Endocrine:  Musculoskeletal:  Back pain, aching muscles, walking  difficulty Allergy/Immunology:   Neurological:  Psychiatric:  Sleep:  The following represents the patient's updated allergies and side effects list: Allergies  Allergen Reactions  . Penicillins     Rash     The neurologically relevant items on the patient's problem list were reviewed on today's visit.  Neurologic Examination  A problem focused neurological exam (12 or more points of the single system neurologic examination, vital signs counts as 1 point, cranial nerves count for 8 points) was performed.  Blood pressure 157/86, pulse 82, height 5\' 5"  (1.651 m), weight 120 lb 3.2 oz (54.522 kg).  General - Well nourished, well developed, in no apparent distress.  Ophthalmologic - Fundi not visualized due to eye movement.  Cardiovascular - Regular rate and rhythm.  Mental Status -  Level of arousal and orientation to time, place, and person were intact. Language including expression, naming, repetition, comprehension was assessed and found intact. Fund of Knowledge was assessed and was intact.  Cranial Nerves II - XII - II - Visual field intact OU. III, IV, VI - Extraocular movements intact. V - Facial sensation intact bilaterally. VII - Facial movement intact bilaterally. VIII - Hearing & vestibular intact bilaterally. X - Palate elevates symmetrically. XI - Chin turning & shoulder shrug intact bilaterally. XII - Tongue protrusion intact.  Motor Strength - The patient's strength was normal in all extremities and pronator drift was absent.  Bulk was normal and fasciculations were absent.   Motor Tone - Muscle tone was assessed at the neck and appendages and was normal.  Reflexes - The patient's reflexes were 1+ in all extremities and she had no pathological reflexes.  Sensory - Light  touch, temperature/pinprick were assessed and were normal.    Coordination - The patient had normal movements in the hands and feet with no ataxia or dysmetria.  Tremor was absent.  Gait  and Station - walk with cane, slow gait but steady and no fall tendency.  Data reviewed: I personally reviewed the images and agree with the radiology interpretations.  Ct Head Wo Contrast 05/07/2015 No acute intracranial pathology. Age-related atrophy and chronic microvascular ischemic disease.   MRI HEAD  05/08/2015 1. Punctate focus of high signal intensity on axial DWI sequence within the right paramedian splenium. This finding is favored to be artifactual in nature, although possible tiny focus of acute ischemia is not entirely excluded. Correlation with symptomatology and history recommended. 2. No other acute intracranial infarct or other process identified. 3. Generalized age-related cerebral atrophy with mild chronic small vessel ischemic disease.   MRA HEAD  05/08/2015 1. No proximal or large arterial branch occlusion within the intracranial circulation. 2. Short-segment moderate stenosis within the supraclinoid left ICA. 3. Mild to moderate short-segment stenosis of the proximal basilar artery. 4. No other significant or focal stenosis identified within the intracranial circulation.   2D echo - - Left ventricle: The cavity size was normal. Wall thickness wasincreased in a pattern of moderate LVH. Systolic function wasnormal. The estimated ejection fraction was in the range of 60%to 65%. Wall motion was normal; there were no regional wallmotion abnormalities. Doppler parameters are consistent withabnormal left ventricular relaxation (grade 1 diastolicdysfunction). - Aortic valve: Trileaflet; mildly thickened, mildly calcified leaflets. - Tricuspid valve: There was moderate regurgitation. - Pulmonary arteries: Systolic pressure was mildly increased. PApeak pressure: 31 mm Hg (S). - Pericardium, extracardiac: A small pericardial effusion was identified along the right ventricular free wall. There was no evidence of hemodynamic compromise Impressions: Prior  echocardiogram from 2014 demonstrated trivial anterior/ RVfree wall pericardial effusion which now has mildly increased. No cardiac source of emboli was indentified.  CUS - Bilateral: 1-39% ICA stenosis. Vertebral artery flow is antegrade.  EEG - This normal EEG is recorded in the waking state. There was no seizure or seizure predisposition recorded on this study.   Component     Latest Ref Rng 01/06/2014 03/19/2015 05/08/2015 05/09/2015  Cholesterol     0 - 200 mg/dL 142  187   Triglycerides     <150 mg/dL 58.0  84   HDL Cholesterol     >40 mg/dL 50.10  41   VLDL     0 - 40 mg/dL 11.6  17   LDL (calc)     0 - 99 mg/dL 80  129 (H)   Total CHOL/HDL Ratio      3  4.6   Hemoglobin A1C     4.8 - 5.6 %   5.7 (H)   Mean Plasma Glucose        117   TSH     0.350 - 4.500 uIU/mL  2.448    Vitamin B-12     180 - 914 pg/mL    473    Assessment: As you may recall, she is a 82 y.o. African American female with PMH of hypertension, hypothyroidism, chronic kidney disease was admitted on 05/07/15 for transient difficulty with speech, left facial droop, difficulty writing. MRI showed right paramedian splenium punctate infarct likely not related to her presenting symptoms. She likely had TIA with left MCA involvement. Stroke work up including MRA, CUS, TTE, EEG, and A1C unremarkable. LDL 129. She was discharged in good  condition with ASA 325mg  and pravastatin 20mg . During the interval time, the patient has been doing well. No recurrent stroke like symptoms. Continued on ASA 325mg  and pravastatin without side effect.   Plan:  - continue ASA and pravastatin for stroke prevention - check BP at home and record and bring over to PCP for medication adjustment if needed - Follow up with your primary care physician for stroke risk factor modification. Recommend maintain blood pressure goal <130/80, diabetes with hemoglobin A1c goal below 6.5% and lipids with LDL cholesterol goal below 70 mg/dL.  - regular  exercise and healthy diet - follow up in one year  No orders of the defined types were placed in this encounter.    No orders of the defined types were placed in this encounter.    Patient Instructions  - continue ASA and pravastatin for stroke prevention - check BP at home and record and bring over to PCP for medication adjustment if needed - Follow up with your primary care physician for stroke risk factor modification. Recommend maintain blood pressure goal <130/80, diabetes with hemoglobin A1c goal below 6.5% and lipids with LDL cholesterol goal below 70 mg/dL.  - regular exercise and healthy diet - follow up in one year    Rosalin Hawking, MD PhD [facility] [address] [place], [place] [address] [phone]

## 2015-12-21 NOTE — Patient Instructions (Signed)
-   continue ASA and pravastatin for stroke prevention - check BP at home and record and bring over to PCP for medication adjustment if needed - Follow up with your primary care physician for stroke risk factor modification. Recommend maintain blood pressure goal <130/80, diabetes with hemoglobin A1c goal below 6.5% and lipids with LDL cholesterol goal below 70 mg/dL.  - regular exercise and healthy diet - follow up in one year

## 2016-03-03 ENCOUNTER — Other Ambulatory Visit: Payer: Self-pay | Admitting: *Deleted

## 2016-03-03 MED ORDER — PRAVASTATIN SODIUM 20 MG PO TABS: 20.0000 mg | ORAL_TABLET | Freq: Every day | ORAL | Status: DC

## 2016-03-12 ENCOUNTER — Ambulatory Visit (INDEPENDENT_AMBULATORY_CARE_PROVIDER_SITE_OTHER): Payer: Medicare Other | Admitting: Family

## 2016-03-12 ENCOUNTER — Encounter: Payer: Self-pay | Admitting: Family

## 2016-03-12 VITALS — BP 150/82 | HR 80 | Temp 98.1°F | Resp 16 | Ht 65.0 in | Wt 120.0 lb

## 2016-03-12 DIAGNOSIS — E785 Hyperlipidemia, unspecified: Secondary | ICD-10-CM

## 2016-03-12 DIAGNOSIS — E039 Hypothyroidism, unspecified: Secondary | ICD-10-CM

## 2016-03-12 DIAGNOSIS — I5189 Other ill-defined heart diseases: Secondary | ICD-10-CM

## 2016-03-12 DIAGNOSIS — I519 Heart disease, unspecified: Secondary | ICD-10-CM | POA: Diagnosis not present

## 2016-03-12 DIAGNOSIS — Z23 Encounter for immunization: Secondary | ICD-10-CM | POA: Diagnosis not present

## 2016-03-12 DIAGNOSIS — I10 Essential (primary) hypertension: Secondary | ICD-10-CM

## 2016-03-12 DIAGNOSIS — Z0000 Encounter for general adult medical examination without abnormal findings: Secondary | ICD-10-CM

## 2016-03-12 DIAGNOSIS — Z0001 Encounter for general adult medical examination with abnormal findings: Secondary | ICD-10-CM | POA: Insufficient documentation

## 2016-03-12 DIAGNOSIS — Z8673 Personal history of transient ischemic attack (TIA), and cerebral infarction without residual deficits: Secondary | ICD-10-CM

## 2016-03-12 HISTORY — DX: Encounter for general adult medical examination without abnormal findings: Z00.00

## 2016-03-12 NOTE — Assessment & Plan Note (Signed)
Reviewed and updated patient's medical, surgical, family and social history. Medications and allergies were also reviewed. Basic screenings for depression, activities of daily living, hearing, cognition and safety were performed. Provider list was updated and health plan was provided to the patient.  

## 2016-03-12 NOTE — Patient Instructions (Addendum)
Thank you for choosing [facility].  Summary/Instructions:  Please continue to take her medications as prescribed.  For Wills:   [email or web address]  [email or web address]   Health Maintenance  Topic Date Due  . TETANUS/TDAP  06/17/1945  . ZOSTAVAX  06/16/1988  . DEXA SCAN  06/16/1993  . PNA vac Low Risk Adult (1 of 2 - PCV13) 06/16/1993  . INFLUENZA VACCINE  04/01/2016   Advance Directive Advance directives are the legal documents that allow you to make choices about your health care and medical treatment if you cannot speak for yourself. Advance directives are a way for you to communicate your wishes to family, friends, and health care providers. The specified people can then convey your decisions about end-of-life care to avoid confusion if you should become unable to communicate. Ideally, the process of discussing and writing advance directives should happen over time rather than making decisions all at once. Advance directives can be modified as your situation changes, and you can change your mind at any time, even after you have signed the advance directives. Each state has its own laws regarding advance directives. You may want to check with your health care provider, attorney, or state representative about the law in your state. Below are some examples of advance directives. LIVING WILL A living will is a set of instructions documenting your wishes about medical care when you cannot care for yourself. It is used if you become:  Terminally ill.  Incapacitated.  Unable to communicate.  Unable to make decisions. Items to consider in your living will include:  The use or non-use of life-sustaining equipment, such as dialysis machines and breathing machines (ventilators).  A do not resuscitate (DNR) order, which is the instruction not to use cardiopulmonary resuscitation (CPR) if breathing or heartbeat stops.  Tube feeding.  Withholding of food and fluids.  Comfort  (palliative) care when the goal becomes comfort rather than a cure.  Organ and tissue donation. A living will does not give instructions about distribution of your money and property if you should pass away. It is advisable to seek the expert advice of a lawyer in drawing up a will regarding your possessions. Decisions about taxes, beneficiaries, and asset distribution will be legally binding. This process can relieve your family and friends of any burdens surrounding disputes or questions that may come up about the allocation of your assets. DO NOT RESUSCITATE (DNR) A do not resuscitate (DNR) order is a request to not have CPR in the event that your heart stops beating or you stop breathing. Unless given other instructions, a health care provider will try to help any patient whose heart has stopped or who has stopped breathing.  HEALTH CARE PROXY AND DURABLE POWER OF ATTORNEY FOR HEALTH CARE A health care proxy is a person (agent) appointed to make medical decisions for you if you cannot. Generally, people choose someone they know well and trust to represent their preferences when they can no longer do so. You should be sure to ask this person for agreement to act as your agent. An agent may have to exercise judgment in the event of a medical decision for which your wishes are not known. The durable power of attorney for health care is the legal document that names your health care proxy. Once written, it should be:  Signed.  Notarized.  Dated.  Copied.  Witnessed.  Incorporated into your medical record. You may also want to appoint someone to manage your financial affairs if you cannot.  This is called a durable power of attorney for finances. It is a separate legal document from the durable power of attorney for health care. You may choose the same person or someone different from your health care proxy to act as your agent in financial matters.   This information is not intended to replace  advice given to you by your health care provider. Make sure you discuss any questions you have with your health care provider.   Document Released: 11/25/2007 Document Revised: 08/23/2013 Document Reviewed: 01/05/2013 Elsevier Interactive Patient Education [employer].  Menopause is a normal process in which your reproductive ability comes to an end. This process happens gradually over a span of months to years, usually between the ages of 71 and 24. Menopause is complete when you have missed 12 consecutive menstrual periods. It is important to talk with your health care provider about some of the most common conditions that affect postmenopausal women, such as heart disease, cancer, and bone loss (osteoporosis). Adopting a healthy lifestyle and getting preventive care can help to promote your health and wellness. Those actions can also lower your chances of developing some of these common conditions. WHAT SHOULD I KNOW ABOUT MENOPAUSE? During menopause, you may experience a number of symptoms, such as:  Moderate-to-severe hot flashes.  Night sweats.  Decrease in sex drive.  Mood swings.  Headaches.  Tiredness.  Irritability.  Memory problems.  Insomnia. Choosing to treat or not to treat menopausal changes is an individual decision that you make with your health care provider. WHAT SHOULD I KNOW ABOUT HORMONE REPLACEMENT THERAPY AND SUPPLEMENTS? Hormone therapy products are effective for treating symptoms that are associated with menopause, such as hot flashes and night sweats. Hormone replacement carries certain risks, especially as you become older. If you are thinking about using estrogen or estrogen with progestin treatments, discuss the benefits and risks with your health care provider. WHAT SHOULD I KNOW ABOUT HEART DISEASE AND STROKE? Heart disease, heart attack, and stroke become more likely as you age. This may be due, in part, to the hormonal changes that your body  experiences during menopause. These can affect how your body processes dietary fats, triglycerides, and cholesterol. Heart attack and stroke are both medical emergencies. There are many things that you can do to help prevent heart disease and stroke:  Have your blood pressure checked at least every 1-2 years. High blood pressure causes heart disease and increases the risk of stroke.  If you are 66-9 years old, ask your health care provider if you should take aspirin to prevent a heart attack or a stroke.  Do not use any tobacco products, including cigarettes, chewing tobacco, or electronic cigarettes. If you need help quitting, ask your health care provider.  It is important to eat a healthy diet and maintain a healthy weight.  Be sure to include plenty of vegetables, fruits, low-fat dairy products, and lean protein.  Avoid eating foods that are high in solid fats, added sugars, or salt (sodium).  Get regular exercise. This is one of the most important things that you can do for your health.  Try to exercise for at least 150 minutes each week. The type of exercise that you do should increase your heart rate and make you sweat. This is known as moderate-intensity exercise.  Try to do strengthening exercises at least twice each week. Do these in addition to the moderate-intensity exercise.  Know your numbers.Ask your health care provider to check your cholesterol  and your blood glucose. Continue to have your blood tested as directed by your health care provider. WHAT SHOULD I KNOW ABOUT CANCER SCREENING? There are several types of cancer. Take the following steps to reduce your risk and to catch any cancer development as early as possible. Breast Cancer  Practice breast self-awareness.  This means understanding how your breasts normally appear and feel.  It also means doing regular breast self-exams. Let your health care provider know about any changes, no matter how small.  If you  are 42 or older, have a clinician do a breast exam (clinical breast exam or CBE) every year. Depending on your age, family history, and medical history, it may be recommended that you also have a yearly breast X-ray (mammogram).  If you have a family history of breast cancer, talk with your health care provider about genetic screening.  If you are at high risk for breast cancer, talk with your health care provider about having an MRI and a mammogram every year.  Breast cancer (BRCA) gene test is recommended for women who have family members with BRCA-related cancers. Results of the assessment will determine the need for genetic counseling and BRCA1 and for BRCA2 testing. BRCA-related cancers include these types:  Breast. This occurs in males or females.  Ovarian.  Tubal. This may also be called fallopian tube cancer.  Cancer of the abdominal or pelvic lining (peritoneal cancer).  Prostate.  Pancreatic. Cervical, Uterine, and Ovarian Cancer Your health care provider may recommend that you be screened regularly for cancer of the pelvic organs. These include your ovaries, uterus, and vagina. This screening involves a pelvic exam, which includes checking for microscopic changes to the surface of your cervix (Pap test).  For women ages 21-65, health care providers may recommend a pelvic exam and a Pap test every three years. For women ages 55-65, they may recommend the Pap test and pelvic exam, combined with testing for human papilloma virus (HPV), every five years. Some types of HPV increase your risk of cervical cancer. Testing for HPV may also be done on women of any age who have unclear Pap test results.  Other health care providers may not recommend any screening for nonpregnant women who are considered low risk for pelvic cancer and have no symptoms. Ask your health care provider if a screening pelvic exam is right for you.  If you have had past treatment for cervical cancer or a condition  that could lead to cancer, you need Pap tests and screening for cancer for at least 20 years after your treatment. If Pap tests have been discontinued for you, your risk factors (such as having a new sexual partner) need to be reassessed to determine if you should start having screenings again. Some women have medical problems that increase the chance of getting cervical cancer. In these cases, your health care provider may recommend that you have screening and Pap tests more often.  If you have a family history of uterine cancer or ovarian cancer, talk with your health care provider about genetic screening.  If you have vaginal bleeding after reaching menopause, tell your health care provider.  There are currently no reliable tests available to screen for ovarian cancer. Lung Cancer Lung cancer screening is recommended for adults 78-13 years old who are at high risk for lung cancer because of a history of smoking. A yearly low-dose CT scan of the lungs is recommended if you:  Currently smoke.  Have a history of at  least 30 pack-years of smoking and you currently smoke or have quit within the past 15 years. A pack-year is smoking an average of one pack of cigarettes per day for one year. Yearly screening should:  Continue until it has been 15 years since you quit.  Stop if you develop a health problem that would prevent you from having lung cancer treatment. Colorectal Cancer  This type of cancer can be detected and can often be prevented.  Routine colorectal cancer screening usually begins at age 76 and continues through age 80.  If you have risk factors for colon cancer, your health care provider may recommend that you be screened at an earlier age.  If you have a family history of colorectal cancer, talk with your health care provider about genetic screening.  Your health care provider may also recommend using home test kits to check for hidden blood in your stool.  A small camera at  the end of a tube can be used to examine your colon directly (sigmoidoscopy or colonoscopy). This is done to check for the earliest forms of colorectal cancer.  Direct examination of the colon should be repeated every 5-10 years until age 37. However, if early forms of precancerous polyps or small growths are found or if you have a family history or genetic risk for colorectal cancer, you may need to be screened more often. Skin Cancer  Check your skin from head to toe regularly.  Monitor any moles. Be sure to tell your health care provider:  About any new moles or changes in moles, especially if there is a change in a mole's shape or color.  If you have a mole that is larger than the size of a pencil eraser.  If any of your family members has a history of skin cancer, especially at a young age, talk with your health care provider about genetic screening.  Always use sunscreen. Apply sunscreen liberally and repeatedly throughout the day.  Whenever you are outside, protect yourself by wearing long sleeves, pants, a wide-brimmed hat, and sunglasses. WHAT SHOULD I KNOW ABOUT OSTEOPOROSIS? Osteoporosis is a condition in which bone destruction happens more quickly than new bone creation. After menopause, you may be at an increased risk for osteoporosis. To help prevent osteoporosis or the bone fractures that can happen because of osteoporosis, the following is recommended:  If you are 8-68 years old, get at least 1,000 mg of calcium and at least 600 mg of vitamin D per day.  If you are older than age 62 but younger than age 66, get at least 1,200 mg of calcium and at least 600 mg of vitamin D per day.  If you are older than age 85, get at least 1,200 mg of calcium and at least 800 mg of vitamin D per day. Smoking and excessive alcohol intake increase the risk of osteoporosis. Eat foods that are rich in calcium and vitamin D, and do weight-bearing exercises several times each week as directed by  your health care provider. WHAT SHOULD I KNOW ABOUT HOW MENOPAUSE AFFECTS [place]? Depression may occur at any age, but it is more common as you become older. Common symptoms of depression include:  Low or sad mood.  Changes in sleep patterns.  Changes in appetite or eating patterns.  Feeling an overall lack of motivation or enjoyment of activities that you previously enjoyed.  Frequent crying spells. Talk with your health care provider if you think that you are experiencing depression.  WHAT SHOULD I KNOW ABOUT IMMUNIZATIONS? It is important that you get and maintain your immunizations. These include:  Tetanus, diphtheria, and pertussis (Tdap) booster vaccine.  Influenza every year before the flu season begins.  Pneumonia vaccine.  Shingles vaccine. Your health care provider may also recommend other immunizations.  Varicella-Zoster Virus Vaccine Live injection What is this medicine? VARICELLA VIRUS VACCINE (var uh SEL uh VAHY ruhs vak SEEN) is used to prevent infections of chickenpox. HERPES ZOSTER VIRUS VACCINE (HUR peez ZOS ter vahy ruhs vak SEEN) is used to prevent shingles in adults 80 years old and over. This vaccine is not used to treat shingles or nerve pain from shingles. These medicines may be used for other purposes; ask your health care provider or pharmacist if you have questions. This medicine may be used for other purposes; ask your health care provider or pharmacist if you have questions. What should I tell my health care provider before I take this medicine? They need to know if you have any of the following conditions: -blood disorders or disease -cancer like leukemia or lymphoma -immune system problems or therapy -infection with fever -recent immune globulin therapy -tuberculosis -an unusual or allergic reaction to vaccines, neomycin, gelatin, other medicines, foods, dyes, or preservatives -pregnant or trying to get pregnant -breast-feeding How  should I use this medicine? These vaccines are for injection under the skin. They are given by a health care professional. A copy of Vaccine Information Statements will be given before each varicella virus vaccination. Read this sheet carefully each time. The sheet may change frequently. A Vaccine Information Statement is not given before the herpes zoster virus vaccine. Talk to your pediatrician regarding the use of the varicella virus vaccine in children. While this drug may be prescribed for children as young as 14 months of age for selected conditions, precautions do apply. The herpes zoster virus vaccine is not approved in children. Overdosage: If you think you have taken too much of this medicine contact a poison control center or emergency room at once. NOTE: This medicine is only for you. Do not share this medicine with others. What if I miss a dose? Keep appointments for follow-up (booster) doses of varicella virus vaccine as directed. It is important not to miss your dose. Call your doctor or health care professional if you are unable to keep an appointment. Follow-up (booster) doses are not needed for the herpes zoster virus vaccine. What may interact with this medicine? Do not take these medicines with any of the following medications: -adalimumab -anakinra -etanercept -infliximab -medicines that suppress your immune system -medicines to treat cancer These medicines may also interact with the following medications: -aspirin and aspirin-like medicines (varicella virus vaccine only) -blood transfusions (varicella virus vaccine only) -immunoglobulins (varicella virus vaccine only) -steroid medicines like prednisone or cortisone This list may not describe all possible interactions. Give your health care provider a list of all the medicines, herbs, non-prescription drugs, or dietary supplements you use. Also tell them if you smoke, drink alcohol, or use illegal drugs. Some items may  interact with your medicine. What should I watch for while using this medicine? Visit your doctor for regular check ups. These vaccines, like all vaccines, may not fully protect everyone. After receiving these vaccines it may be possible to pass chickenpox infection to others. For up to 6 weeks, avoid people with immune system problems, pregnant women who have not had chickenpox, newborns of women who have not had chickenpox, and all newborns born at  less than 28 weeks of pregnancy. Talk to your doctor for more information. Do not become pregnant for 3 months after taking these vaccines. Women should inform their doctor if they wish to become pregnant or think they might be pregnant. There is a potential for serious side effects to an unborn child. Talk to your health care professional or pharmacist for more information. What side effects may I notice from receiving this medicine? Side effects that you should report to your doctor or health care professional as soon as possible: -allergic reactions like skin rash, itching or hives, swelling of the face, lips, or tongue -breathing problems -extreme changes in behavior -feeling faint or lightheaded, falls -fever over 102 degrees F -pain, tingling, numbness in the hands or feet -redness, blistering, peeling or loosening of the skin, including inside the mouth -seizures -unusually weak or tired Side effects that usually do not require medical attention (report to your doctor or health care professional if they continue or are bothersome): -aches or pains -chickenpox-like rash -diarrhea -headache -low-grade fever under 102 degrees F -loss of appetite -nausea, vomiting -redness, pain, swelling at site where injected -sleepy -trouble sleeping This list may not describe all possible side effects. Call your doctor for medical advice about side effects. You may report side effects to FDA at 1-800-FDA-1088. Where should I keep my medicine? These  drugs are given in a hospital or clinic and will not be stored at home. NOTE: This sheet is a summary. It may not cover all possible information. If you have questions about this medicine, talk to your doctor, pharmacist, or health care provider.    2016, Elsevier/Gold Standard. (2013-04-22 14:24:35)    This information is not intended to replace advice given to you by your health care provider. Make sure you discuss any questions you have with your health care provider.   Document Released: 10/10/2005 Document Revised: 09/08/2014 Document Reviewed: 04/20/2014 Elsevier Interactive Patient Education [employer].

## 2016-03-12 NOTE — Assessment & Plan Note (Signed)
Continue to monitor risk factors for future transient ischemic attacks including hypertension and hyperlipidemia. Hypertension is slightly elevated today and hyperlipidemia very well controlled with current regimens. 18 to monitor.

## 2016-03-12 NOTE — Assessment & Plan Note (Addendum)
1) Anticipatory Guidance: Discussed importance of wearing a seatbelt while driving and not texting while driving; changing batteries in smoke detector at least once annually; wearing suntan lotion when outside; eating a balanced and moderate diet; getting physical activity at least 30 minutes per day.  2) Immunizations / Screenings / Labs:  Prevnar and tetanus updated which completes pneumonia vaccinations. Declines Zostavax. All other immunizations are up to date per recommendations. Due for an eye and dental screen encouraged to be completed independently. All other screenings are up to date per recommendations. Blood work previously completed reviewed.   Overall well exam with risk factors for cardiovascular disease including hyperlipidemia, hypertension, previous transient ischemic attacks, and diastolic dysfunction. Blood pressure slightly elevated above goal today. Continue to monitor and consider additional medication if blood pressure remains elevated at next office visit. Cholesterol appears adequately controlled with no symptoms of myalgia. Continue to monitor risk factors for transient ischemic attacks as mentioned or cardiovascular disease. Encouraged increasing physical activity to 30 minutes of moderate level activity daily. Emphasize nutrient dense foods and decreasing saturated fats and processed/sugary foods and diet. She is of good weight. Continue other healthy lifestyle behaviors and choices. Follow-up prevention exam in 1 year. Follow-up office visit for chronic conditions in 3 months.

## 2016-03-12 NOTE — Assessment & Plan Note (Signed)
Most recent echocardiogram with 60-65% ejection fraction and grade 1 diastolic dysfunction. Appears stable and euvolemic. NYHA Class 1. Atenolol for beta blocker therapy. Continue to follow up with cardiology.

## 2016-03-12 NOTE — Assessment & Plan Note (Signed)
Hypertension remains elevated above goal of 140/90 with current regimen. Continue to monitor and consider additional medications if next office visit blood pressure remains elevated. Does have chronic kidney disease stage III and may benefit from low-dose ACE inhibitor. Continue current dosage of atenolol. Monitor blood pressure at home. Continue low-sodium diet.

## 2016-03-12 NOTE — Assessment & Plan Note (Signed)
Hypothyroidism currently maintained on levothyroxine and managed by endocrinology. Appears stable presently. Continue current dosage of levothyroxine and changes per endocrinology.

## 2016-03-12 NOTE — Progress Notes (Signed)
Subjective:    Patient ID: Vanessa Clayton, female    DOB: 23-Jun-1926, 82 y.o.   MRN: [id number]  Chief Complaint  Patient presents with  . Establish Care    HPI:  Vanessa Clayton is a 82 y.o. female who presents today for an annual wellness visit.   1) Health Maintenance -   Diet - Averaging about 2 meals per day consisting of chicken, beef, pork, fish and fruits/vegetables. Caffeine about 1-2 cups per day  Exercise - No structured exercise  2) Preventative Exams / Immunizations:  Dental -- Due for exam  Vision -- Due for exam   Health Maintenance  Topic Date Due  . TETANUS/TDAP  06/17/1947  . ZOSTAVAX  06/16/1988  . DEXA SCAN  06/16/1993  . PNA vac Low Risk Adult (2 of 2 - PPSV23) 01/03/2014  . INFLUENZA VACCINE  04/01/2016     Immunization History  Administered Date(s) Administered  . Pneumococcal Conjugate-13 03/12/2016  . Tdap 03/12/2016    RISK FACTORS  Tobacco History  Smoking status  . Never Smoker   Smokeless tobacco  . Never Used     Cardiac risk factors: advanced age (older than 71 for men, 64 for women), dyslipidemia and hypertension.  Depression Screen   Depression screen [facility] 2/9 03/12/2016  Decreased Interest 0  Down, Depressed, Hopeless 0  PHQ - 2 Score 0    Activities of Daily Living In your present state of health, do you have any difficulty performing the following activities?:  Driving? No Managing money?  No Feeding yourself? No Getting from bed to chair? No Climbing a flight of stairs? No Preparing food and eating?: No Bathing or showering? No Getting dressed: No Getting to the toilet? No Using the toilet: No Moving around from place to place: No In the past year have you fallen or had a near fall?:No   Home Safety Has smoke detector and wears seat belts. No excess sun exposure. Are there smokers in your home (other than you)?  No Do you feel safe at home?  Yes  Hearing Difficulties: No Do you often ask people to  speak up or repeat themselves? No Do you experience ringing or noises in your ears? No  Do you have difficulty understanding soft or whispered voices? No    Cognitive Testing  Alert? Yes   Normal Appearance? Yes  Oriented to person? Yes  Place? Yes   Time? Yes  Recall of three objects?  Yes  Can perform simple calculations? Yes  Displays appropriate judgment? Yes  Can read the correct time from a watch face? Yes  Do you feel that you have a problem with memory? No  Do you often misplace items? No   Advanced Directives have been discussed with the patient? Yes Current Physicians/Providers and Suppliers  1. Terri Piedra, FNP - Internal Medicine 2. Larae Grooms, MD - Cardiology 3. Lorne Skeens, MD - Endocrinology 4. Juleen China, MD - Othropedics 5. Rosalin Hawking, MD - Neurology  Indicate any recent Medical Services you may have received from other than [facility] providers in the past year (date may be approximate).  All answers were reviewed with the patient and necessary referrals were made:  Mauricio Po, FNP   03/12/2016    Allergies  Allergen Reactions  . Penicillins     Rash      Outpatient Prescriptions Prior to Visit  Medication Sig Dispense Refill  . acetaminophen (TYLENOL) 500 MG tablet Take 1,000 mg by mouth every  6 (six) hours as needed for moderate pain.    Marland Kitchen aspirin 325 MG tablet Take 1 tablet (325 mg total) by mouth daily. 60 tablet 1  . atenolol (TENORMIN) 50 MG tablet Take 0.5 tablets (25 mg total) by mouth daily. 45 tablet 3  . escitalopram (LEXAPRO) 10 MG tablet Take 10 mg by mouth daily.    . feeding supplement (BOOST HIGH PROTEIN) LIQD Take 1 Container by mouth 2 (two) times daily.    . Ferrous Sulfate (SLOW FE PO) Take 1 tablet by mouth daily.     Marland Kitchen levothyroxine (SYNTHROID, LEVOTHROID) 75 MCG tablet Take 75 mcg by mouth daily before breakfast.     . pravastatin (PRAVACHOL) 20 MG tablet Take 1 tablet (20 mg total) by mouth daily at 6 PM. 90  tablet 3  . Vitamin D, Ergocalciferol, (DRISDOL) 50000 UNITS CAPS Take 50,000 Units by mouth every 14 (fourteen) days.      No facility-administered medications prior to visit.     Past Medical History  Diagnosis Date  . HTN (hypertension)   . Breast cancer ([place])     left  . Hypothyroidism     following treatment for hyperthyroidism  . Hyperthyroidism   . Chronic anemia   . Vitamin D deficiency   . Chronic anxiety   . Syncope and collapse   . History of TIA (transient ischemic attack) 10/01/2015  . Allergy      Past Surgical History  Procedure Laterality Date  . Breast lumpectomy  1998    left with radiation  . Knee arthroscopy  10/15/05  . Colon surgery    . Tonsillectomy    . Cholecystectomy       Family History  Problem Relation Age of Onset  . Hypertension Mother   . Heart attack Neg Hx   . Stroke Neg Hx   . Hypertension Brother      Social History   Social History  . Marital Status: Married    Spouse Name: N/A  . Number of Children: 1  . Years of Education: 16   Occupational History  . Not on file.   Social History Main Topics  . Smoking status: Never Smoker   . Smokeless tobacco: Never Used  . Alcohol Use: No  . Drug Use: No  . Sexual Activity: Not on file   Other Topics Concern  . Not on file   Social History Narrative   Fun: Travel    Denies abuse and feels safe at home.      Review of Systems  Constitutional: Denies fever, chills, fatigue, or significant weight gain/loss. HENT: Head: Denies headache or neck pain Ears: Denies changes in hearing, ringing in ears, earache, drainage Nose: Denies discharge, stuffiness, itching, nosebleed, sinus pain Throat: Denies sore throat, hoarseness, dry mouth, sores, thrush Eyes: Denies loss/changes in vision, pain, redness, blurry/double vision, flashing lights Cardiovascular: Denies chest pain/discomfort, tightness, palpitations, shortness of breath with activity, difficulty lying down,  swelling, sudden awakening with shortness of breath Respiratory: Denies shortness of breath, cough, sputum production, wheezing Gastrointestinal: Denies dysphasia, heartburn, change in appetite, nausea, change in bowel habits, rectal bleeding, constipation, diarrhea, yellow skin or eyes Genitourinary: Denies frequency, urgency, burning/pain, blood in urine, incontinence, change in urinary strength. Musculoskeletal: Denies muscle/joint pain, stiffness, back pain, redness or swelling of joints, trauma Skin: Denies rashes, lumps, itching, dryness, color changes, or hair/nail changes Neurological: Denies dizziness, fainting, seizures, weakness, numbness, tingling, tremor Psychiatric - Denies nervousness, stress, depression or memory loss Endocrine:  Denies heat or cold intolerance, sweating, frequent urination, excessive thirst, changes in appetite Hematologic: Denies ease of bruising or bleeding    Objective:     BP 150/82 mmHg  Pulse 80  Temp(Src) 98.1 F (36.7 C) (Oral)  Resp 16  Ht 5\' 5"  (1.651 m)  Wt 120 lb (54.432 kg)  BMI 19.97 kg/m2  SpO2 97% Nursing note and vital signs reviewed.  Physical Exam  Constitutional: She is oriented to person, place, and time. She appears well-developed and well-nourished.  HENT:  Head: Normocephalic.  Right Ear: Hearing, tympanic membrane, external ear and ear canal normal.  Left Ear: Hearing, tympanic membrane, external ear and ear canal normal.  Nose: Nose normal.  Mouth/Throat: Uvula is midline, oropharynx is clear and moist and mucous membranes are normal.  Eyes: Conjunctivae and EOM are normal. Pupils are equal, round, and reactive to light.  Neck: Neck supple. No JVD present. No tracheal deviation present. No thyromegaly present.  Cardiovascular: Normal rate, regular rhythm, normal heart sounds and intact distal pulses.   Pulmonary/Chest: Effort normal and breath sounds normal.  Abdominal: Soft. Bowel sounds are normal. She exhibits no  distension and no mass. There is no tenderness. There is no rebound and no guarding.  Musculoskeletal: Normal range of motion. She exhibits no edema or tenderness.  Lymphadenopathy:    She has no cervical adenopathy.  Neurological: She is alert and oriented to person, place, and time. She has normal reflexes. No cranial nerve deficit. She exhibits normal muscle tone. Coordination normal.  Skin: Skin is warm and dry.  Psychiatric: She has a normal mood and affect. Her behavior is normal. Judgment and thought content normal.       Assessment & Plan:   During the course of the visit the patient was educated and counseled about appropriate screening and preventive services including:    Pneumococcal vaccine   Influenza vaccine  Glaucoma screening  Nutrition counseling   Diet review for nutrition referral? Yes ____  Not Indicated _X___   Patient Instructions (the written plan) was given to the patient.  Medicare Attestation I have personally reviewed: The patient's medical and social history Their use of alcohol, tobacco or illicit drugs Their current medications and supplements The patient's functional ability including ADLs,fall risks, home safety risks, cognitive, and hearing and visual impairment Diet and physical activities Evidence for depression or mood disorders  The patient's weight, height, BMI,  have been recorded in the chart.  I have made referrals, counseling, and provided education to the patient based on review of the above and I have provided the patient with a written personalized care plan for preventive services.     Mauricio Po, [place]   03/12/2016    Problem List Items Addressed This Visit      Cardiovascular and Mediastinum   Essential hypertension    Hypertension remains elevated above goal of 140/90 with current regimen. Continue to monitor and consider additional medications if next office visit blood pressure remains elevated. Does have chronic  kidney disease stage III and may benefit from low-dose ACE inhibitor. Continue current dosage of atenolol. Monitor blood pressure at home. Continue low-sodium diet.         Endocrine   Hypothyroidism    Hypothyroidism currently maintained on levothyroxine and managed by endocrinology. Appears stable presently. Continue current dosage of levothyroxine and changes per endocrinology.        Other   Diastolic dysfunction    Most recent echocardiogram with 60-65% ejection fraction and  grade 1 diastolic dysfunction. Appears stable and euvolemic. NYHA Class 1. Atenolol for beta blocker therapy. Continue to follow up with cardiology.       History of TIA (transient ischemic attack)    Continue to monitor risk factors for future transient ischemic attacks including hypertension and hyperlipidemia. Hypertension is slightly elevated today and hyperlipidemia very well controlled with current regimens. 18 to monitor.      HLD (hyperlipidemia)    Hyperlipidemia well controlled with current regimen of pravastatin and no adverse side effects or myalgias. Continue current dosage of pravastatin.      Medicare annual wellness visit, subsequent - Primary    Reviewed and updated patient's medical, surgical, family and social history. Medications and allergies were also reviewed. Basic screenings for depression, activities of daily living, hearing, cognition and safety were performed. Provider list was updated and health plan was provided to the patient.       Routine general medical examination at a health care facility    1) Anticipatory Guidance: Discussed importance of wearing a seatbelt while driving and not texting while driving; changing batteries in smoke detector at least once annually; wearing suntan lotion when outside; eating a balanced and moderate diet; getting physical activity at least 30 minutes per day.  2) Immunizations / Screenings / Labs:  Prevnar and tetanus updated which completes  pneumonia vaccinations. Declines Zostavax. All other immunizations are up to date per recommendations. Due for an eye and dental screen encouraged to be completed independently. All other screenings are up to date per recommendations. Blood work previously completed reviewed.   Overall well exam with risk factors for cardiovascular disease including hyperlipidemia, hypertension, previous transient ischemic attacks, and diastolic dysfunction. Blood pressure slightly elevated above goal today. Continue to monitor and consider additional medication if blood pressure remains elevated at next office visit. Cholesterol appears adequately controlled with no symptoms of myalgia. Continue to monitor risk factors for transient ischemic attacks as mentioned or cardiovascular disease. Encouraged increasing physical activity to 30 minutes of moderate level activity daily. Emphasize nutrient dense foods and decreasing saturated fats and processed/sugary foods and diet. She is of good weight. Continue other healthy lifestyle behaviors and choices. Follow-up prevention exam in 1 year. Follow-up office visit for chronic conditions in 3 months.       Other Visit Diagnoses    Need for vaccination with 13-polyvalent pneumococcal conjugate vaccine        Relevant Orders    Pneumococcal conjugate vaccine 13-valent IM (Completed)    Need for diphtheria-tetanus-pertussis (Tdap) vaccine, adult/adolescent        Relevant Orders    Tdap vaccine greater than or equal to 7yo IM (Completed)

## 2016-03-12 NOTE — Assessment & Plan Note (Signed)
Hyperlipidemia well controlled with current regimen of pravastatin and no adverse side effects or myalgias. Continue current dosage of pravastatin.

## 2016-04-01 ENCOUNTER — Encounter: Payer: Self-pay | Admitting: Interventional Cardiology

## 2016-04-01 ENCOUNTER — Encounter (INDEPENDENT_AMBULATORY_CARE_PROVIDER_SITE_OTHER): Payer: Self-pay

## 2016-04-01 ENCOUNTER — Ambulatory Visit (INDEPENDENT_AMBULATORY_CARE_PROVIDER_SITE_OTHER): Payer: Medicare Other | Admitting: Interventional Cardiology

## 2016-04-01 VITALS — BP 120/70 | HR 73 | Ht 65.0 in | Wt 120.0 lb

## 2016-04-01 DIAGNOSIS — I119 Hypertensive heart disease without heart failure: Secondary | ICD-10-CM

## 2016-04-01 DIAGNOSIS — E785 Hyperlipidemia, unspecified: Secondary | ICD-10-CM

## 2016-04-01 DIAGNOSIS — I519 Heart disease, unspecified: Secondary | ICD-10-CM | POA: Diagnosis not present

## 2016-04-01 DIAGNOSIS — I5189 Other ill-defined heart diseases: Secondary | ICD-10-CM

## 2016-04-01 NOTE — Patient Instructions (Signed)

## 2016-04-01 NOTE — Progress Notes (Signed)
Cardiology Office Note   Date:  04/01/2016   ID:  Vanessa, Clayton 1926/04/08, MRN [id number]  PCP:  Mauricio Po, FNP    No chief complaint on file. HTN   Wt Readings from Last 3 Encounters:  04/01/16 120 lb (54.4 kg)  03/12/16 120 lb (54.4 kg)  12/21/15 120 lb 3.2 oz (54.5 kg)       History of Present Illness: Vanessa Clayton is a 82 y.o. female  woman is seen for a scheduled 6 month followup office visit.  She saw Dr. Mare Ferrari in the past.  She has a past history of essential hypertension and history of hypercholesterolemia. She has a remote history of thyrotoxicosis. She's also had a past history of breast cancer of the left breast.  She had an echocardiogram in 07/04/13 showing an ejection fraction of 55-60% with grade 1 diastolic dysfunction. Her recent lab work has been stable.  She was hospitalized on 05/07/15 for possible TIA. An echocardiogram on 05/08/15 showed moderate LVH and an ejection fraction of [id number] and no embolic source. She also had normal carotid ultrasound and a normal EEG. In the hospital she was started on statin in the form of pravastatin 20 mg daily.  Minimal walking.  Cares for her husband who has dementia.  Walks with a cane.  No bleeding problems.  No recent falls.  She tries to eat a healthy diet.  She was losing weight last year but this has levelled off.      Past Medical History:  Diagnosis Date  . Allergy   . Breast cancer ([place])    left  . Chronic anemia   . Chronic anxiety   . History of TIA (transient ischemic attack) 10/01/2015  . HTN (hypertension)   . Hyperthyroidism   . Hypothyroidism    following treatment for hyperthyroidism  . Syncope and collapse   . Vitamin D deficiency     Past Surgical History:  Procedure Laterality Date  . BREAST LUMPECTOMY  1998   left with radiation  . CHOLECYSTECTOMY    . COLON SURGERY    . KNEE ARTHROSCOPY  10/15/05  . TONSILLECTOMY       Current Outpatient Prescriptions  Medication  Sig Dispense Refill  . acetaminophen (TYLENOL) 500 MG tablet Take 1,000 mg by mouth every 6 (six) hours as needed for moderate pain.    Marland Kitchen aspirin 325 MG tablet Take 1 tablet (325 mg total) by mouth daily. 60 tablet 1  . atenolol (TENORMIN) 50 MG tablet Take 0.5 tablets (25 mg total) by mouth daily. 45 tablet 3  . escitalopram (LEXAPRO) 10 MG tablet Take 10 mg by mouth daily.    . feeding supplement (BOOST HIGH PROTEIN) LIQD Take 1 Container by mouth 2 (two) times daily.    . Ferrous Sulfate (SLOW FE PO) Take 1 tablet by mouth daily.     Marland Kitchen levothyroxine (SYNTHROID, LEVOTHROID) 75 MCG tablet Take 75 mcg by mouth daily before breakfast.     . pravastatin (PRAVACHOL) 20 MG tablet Take 1 tablet (20 mg total) by mouth daily at 6 PM. 90 tablet 3  . Vitamin D, Ergocalciferol, (DRISDOL) 50000 UNITS CAPS Take 50,000 Units by mouth every 14 (fourteen) days.      No current facility-administered medications for this visit.     Allergies:   Penicillins    Social History:  The patient  reports that she has never smoked. She has never used smokeless tobacco. She reports that she  does not drink alcohol or use drugs.   Family History:  The patient's family history includes Hypertension in her brother and mother.    ROS:  Please see the history of present illness.   Otherwise, review of systems are positive for knee pain.   All other systems are reviewed and negative.    PHYSICAL EXAM: VS:  BP 120/70   Pulse 73   Ht 5\' 5"  (1.651 m)   Wt 120 lb (54.4 kg)   BMI 19.97 kg/m  , BMI Body mass index is 19.97 kg/m. GEN: Well nourished, well developed, in no acute distress  HEENT: normal  Neck: no JVD, carotid bruits, or masses Cardiac: RRR; no murmurs, rubs, or gallops,no edema  Respiratory:  clear to auscultation bilaterally, normal work of breathing GI: soft, nontender, nondistended, + BS MS: no deformity or atrophy  Skin: warm and dry, no rash Neuro:  Strength and sensation are intact Psych:  euthymic mood, full affect   EKG:   The ekg ordered today demonstrates NSR, PVCs, nnspecific ST segment changes   Recent Labs: 05/10/2015: Hemoglobin 9.9; Platelets 258 10/01/2015: ALT 23; BUN 15; Creat 1.00; Potassium 4.0; Sodium 143   Lipid Panel    Component Value Date/Time   CHOL 106 (L) 10/01/2015 1100   TRIG 80 10/01/2015 1100   HDL 44 (L) 10/01/2015 1100   CHOLHDL 2.4 10/01/2015 1100   VLDL 16 10/01/2015 1100   LDLCALC 46 10/01/2015 1100     Other studies Reviewed: Additional studies/ records that were reviewed today with results demonstrating: echo results as noted.   ASSESSMENT AND PLAN:  1. Hyperlipidemia: Lipids well controlled on pravastatin.  Lipids reviewed and controlled.  2. Diastolic dysfunction:  Appears euvolemic 3. Prior TIA in 9/16:  COntinue aspirin.  No bleeding problems. 4. HTN heart disease: BP well controlled.  COntinue current meds.   Current medicines are reviewed at length with the patient today.  The patient concerns regarding her medicines were addressed.  The following changes have been made:  No change  Labs/ tests ordered today include:  No orders of the defined types were placed in this encounter.   Recommend 150 minutes/week of aerobic exercise Low fat, low carb, high fiber diet recommended  Disposition:   FU in 1 year   Signed, Larae Grooms, MD  04/01/2016 12:42 PM    [facility] [address] [place], [place], [place]  [address] Phone: [phone]; Fax: [phone]

## 2016-06-10 ENCOUNTER — Other Ambulatory Visit: Payer: Self-pay | Admitting: Interventional Cardiology

## 2016-06-10 MED ORDER — ATENOLOL 50 MG PO TABS: 25.0000 mg | ORAL_TABLET | Freq: Every day | ORAL | 3 refills | Status: DC

## 2016-08-18 ENCOUNTER — Encounter: Payer: Self-pay | Admitting: Neurology

## 2016-10-09 DIAGNOSIS — D649 Anemia, unspecified: Secondary | ICD-10-CM | POA: Insufficient documentation

## 2016-10-09 DIAGNOSIS — R7301 Impaired fasting glucose: Secondary | ICD-10-CM | POA: Insufficient documentation

## 2016-10-09 DIAGNOSIS — E890 Postprocedural hypothyroidism: Secondary | ICD-10-CM | POA: Insufficient documentation

## 2016-10-09 DIAGNOSIS — E559 Vitamin D deficiency, unspecified: Secondary | ICD-10-CM | POA: Insufficient documentation

## 2016-10-09 DIAGNOSIS — I10 Essential (primary) hypertension: Secondary | ICD-10-CM | POA: Insufficient documentation

## 2016-12-08 ENCOUNTER — Other Ambulatory Visit: Payer: Self-pay | Admitting: *Deleted

## 2016-12-08 MED ORDER — PRAVASTATIN SODIUM 20 MG PO TABS: 20.0000 mg | ORAL_TABLET | Freq: Every day | ORAL | 0 refills | Status: DC

## 2016-12-18 ENCOUNTER — Ambulatory Visit: Payer: Medicare Other | Admitting: Neurology

## 2017-04-20 ENCOUNTER — Ambulatory Visit: Payer: Medicare Other | Admitting: Interventional Cardiology

## 2017-05-26 ENCOUNTER — Other Ambulatory Visit: Payer: Self-pay | Admitting: Interventional Cardiology

## 2017-06-03 ENCOUNTER — Other Ambulatory Visit: Payer: Self-pay | Admitting: Interventional Cardiology

## 2017-06-17 ENCOUNTER — Ambulatory Visit (INDEPENDENT_AMBULATORY_CARE_PROVIDER_SITE_OTHER): Payer: Medicare Other | Admitting: Interventional Cardiology

## 2017-06-17 ENCOUNTER — Encounter: Payer: Self-pay | Admitting: Interventional Cardiology

## 2017-06-17 VITALS — BP 126/64 | HR 71 | Ht 65.0 in | Wt 120.8 lb

## 2017-06-17 DIAGNOSIS — I119 Hypertensive heart disease without heart failure: Secondary | ICD-10-CM

## 2017-06-17 DIAGNOSIS — Z8673 Personal history of transient ischemic attack (TIA), and cerebral infarction without residual deficits: Secondary | ICD-10-CM

## 2017-06-17 DIAGNOSIS — I519 Heart disease, unspecified: Secondary | ICD-10-CM

## 2017-06-17 DIAGNOSIS — E782 Mixed hyperlipidemia: Secondary | ICD-10-CM | POA: Diagnosis not present

## 2017-06-17 DIAGNOSIS — I5189 Other ill-defined heart diseases: Secondary | ICD-10-CM

## 2017-06-17 MED ORDER — ASPIRIN EC 81 MG PO TBEC: 81.0000 mg | DELAYED_RELEASE_TABLET | Freq: Every day | ORAL | 3 refills | Status: DC

## 2017-06-17 NOTE — Patient Instructions (Signed)
Medication Instructions:  Your physician has recommended you make the following change in your medication:   DECREASE Asprin to 81 mg daily    Labwork: None ordered  Testing/Procedures: None ordered  Follow-Up: Your physician wants you to follow-up in: 1 year with Dr. Irish Lack. You will receive a reminder letter in the mail two months in advance. If you don't receive a letter, please call our office to schedule the follow-up appointment.   Any Other Special Instructions Will Be Listed Below (If Applicable).     If you need a refill on your cardiac medications before your next appointment, please call your pharmacy.

## 2017-06-17 NOTE — Progress Notes (Signed)
Cardiology Office Note   Date:  06/17/2017   ID:  Vanessa, Clayton April 21, 1926, MRN [id number]  PCP:  Shantea Circle, FNP    No chief complaint on file. Hypertensive heart disease   Wt Readings from Last 3 Encounters:  06/17/17 120 lb 12.8 oz (54.8 kg)  04/01/16 120 lb (54.4 kg)  03/12/16 120 lb (54.4 kg)       History of Present Illness: Vanessa Clayton is a 83 y.o. female  saw Dr. Mare Ferrari in the past.  She has a past history of essential hypertension and history of hypercholesterolemia. She has a remote history of thyrotoxicosis. She's also had a past history of breast cancer of the left breast.  She had an echocardiogram in 07/04/13 showing an ejection fraction of 55-60% with grade 1 diastolic dysfunction. Her recent lab work has been stable.  She was hospitalized on 05/07/15 for possible TIA. An echocardiogram on 05/08/15 showed moderate LVH and an ejection fraction of 37-10% and no embolic source. She also had normal carotid ultrasound and a normal EEG. In the hospital she was started on statin in the form of pravastatin 20 mg daily.  Denies : Chest pain. Dizziness. Leg edema. Nitroglycerin use. Orthopnea. Palpitations. Paroxysmal nocturnal dyspnea. Shortness of breath. Syncope.   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  She does not do much walking.  SHe does not check her BP at home.  No recent falls.  No bleeding problems.     Past Medical History:  Diagnosis Date  . Allergy   . Breast cancer ([place])    left  . Chronic anemia   . Chronic anxiety   . History of TIA (transient ischemic attack) 10/01/2015  . HTN (hypertension)   . Hyperthyroidism   . Hypothyroidism    following treatment for hyperthyroidism  . Syncope and collapse   . Vitamin D deficiency     Past Surgical History:  Procedure Laterality Date  . BREAST LUMPECTOMY  1998   left with radiation  . CHOLECYSTECTOMY    . COLON SURGERY    . KNEE ARTHROSCOPY  10/15/05  .  TONSILLECTOMY       Current Outpatient Prescriptions  Medication Sig Dispense Refill  . acetaminophen (TYLENOL) 500 MG tablet Take 1,000 mg by mouth every 6 (six) hours as needed for moderate pain.    Marland Kitchen aspirin 325 MG tablet Take 1 tablet (325 mg total) by mouth daily. 60 tablet 1  . atenolol (TENORMIN) 50 MG tablet TAKE 0.5 TABLETS (25 MG TOTAL) BY MOUTH DAILY. 45 tablet 3  . escitalopram (LEXAPRO) 10 MG tablet Take 10 mg by mouth daily.    . Ferrous Sulfate (SLOW FE PO) Take 1 tablet by mouth daily.     Marland Kitchen levothyroxine (SYNTHROID, LEVOTHROID) 75 MCG tablet Take 75 mcg by mouth daily before breakfast.     . pravastatin (PRAVACHOL) 20 MG tablet TAKE 1 TABLET BY MOUTH DAILY AT 6 PM 30 tablet 0  . Vitamin D, Ergocalciferol, (DRISDOL) 50000 UNITS CAPS Take 50,000 Units by mouth every 14 (fourteen) days.      No current facility-administered medications for this visit.     Allergies:   Penicillins    Social History:  The patient  reports that she has never smoked. She has never used smokeless tobacco. She reports that she does not drink alcohol or use drugs.   Family History:  The patient's family history includes Hypertension in her brother and mother.  ROS:  Please see the history of present illness.   Otherwise, review of systems are positive for stress , knee pain.   All other systems are reviewed and negative.    PHYSICAL EXAM: VS:  BP 126/64   Pulse 71   Ht 5\' 5"  (1.651 m)   Wt 120 lb 12.8 oz (54.8 kg)   SpO2 99%   BMI 20.10 kg/m  , BMI Body mass index is 20.1 kg/m. GEN: Well nourished, well developed, in no acute distress  HEENT: normal  Neck: no JVD, carotid bruits, or masses Cardiac: RRR; no murmurs, rubs, or gallops,no edema  Respiratory:  clear to auscultation bilaterally, normal work of breathing GI: soft, nontender, nondistended, + BS MS: no deformity or atrophy  Skin: warm and dry, no rash Neuro:  Strength and sensation are intact Psych: euthymic mood, full  affect   EKG:   The ekg ordered today demonstrates NSR, poorp R wave progression, no ST changes, no change from prior ECG in 2017   Recent Labs: No results found for requested labs within last 8760 hours.   Lipid Panel    Component Value Date/Time   CHOL 106 (L) 10/01/2015 1100   TRIG 80 10/01/2015 1100   HDL 44 (L) 10/01/2015 1100   CHOLHDL 2.4 10/01/2015 1100   VLDL 16 10/01/2015 1100   LDLCALC 46 10/01/2015 1100     Other studies Reviewed: Additional studies/ records that were reviewed today with results demonstrating: LDL controlled in 2017.   ASSESSMENT AND PLAN:   1.  HTN heart disease: COntinue current meds.  BP controlled.   Hyperlipidemia:  Continue pravastatin.  TO be checked with Dr. Elyse Hsu. 2. Diastolic dysfunction: Appears euvolemic.  BP controlled.   3. Prior TIA: Decrease aspirin to 81 mg daily. 4.     Current medicines are reviewed at length with the patient today.  The patient concerns regarding her medicines were addressed.  The following changes have been made:  Decrease aspirin 81 mg daily.  Labs/ tests ordered today include:  No orders of the defined types were placed in this encounter.   Recommend 150 minutes/week of aerobic exercise Low fat, low carb, high fiber diet recommended  Disposition:   FU in 1 year   Signed, Larae Grooms, MD  06/17/2017 3:21 PM    [facility] [address], [place], [place]  [address] Phone: [phone]; Fax: [phone]

## 2017-07-01 ENCOUNTER — Other Ambulatory Visit: Payer: Self-pay | Admitting: Interventional Cardiology

## 2018-01-14 ENCOUNTER — Ambulatory Visit: Payer: Medicare Other | Admitting: Nurse Practitioner

## 2018-01-14 ENCOUNTER — Encounter: Payer: Self-pay | Admitting: Nurse Practitioner

## 2018-01-14 ENCOUNTER — Encounter

## 2018-01-14 VITALS — BP 122/80 | HR 75 | Temp 98.3°F | Resp 14 | Ht 65.0 in | Wt 118.8 lb

## 2018-01-14 DIAGNOSIS — Z0000 Encounter for general adult medical examination without abnormal findings: Secondary | ICD-10-CM

## 2018-01-14 DIAGNOSIS — F330 Major depressive disorder, recurrent, mild: Secondary | ICD-10-CM

## 2018-01-14 NOTE — Assessment & Plan Note (Signed)
Discussed coping with grief and information printed in AVS Offered referral to psychology for counseling and she is interested F/U for new, worsening symptoms - Ambulatory referral to Psychology

## 2018-01-14 NOTE — Progress Notes (Signed)
Name: Vanessa Clayton   MRN: [id number]    DOB: 1926-07-07   Date:01/14/2018       Progress Note  Subjective  Chief Complaint  Chief Complaint  Patient presents with  . Establish Care    handicap placard    HPI Vanessa Clayton is establishing care with me today. 84 yo female who independently completes ADLs and cares for herself at home. Aside from the recent death of her husband, she has no complaints. She is requesting handicap placard today. She is accompanied by her daughter to appointment today She is Followed by cardiology annually for benign hypertensive heart disease, mixed hyperlipidemia, diastolic dysfunction; Endocrinology every 3 months at [facility] for hypothyroidism, hypercholesterolemia, hypertension, vitamin D deficiency, and anemia; and orthopedics for knee pain. She was actually seen by her endocrinologist this morning for routine follow up.  Anxiety and depression- Maintained on lexapro by endocrinologist She has been maintained on same dosage of lexapro for some time, even prior to passing of her husband Her husband passed away in 11/15/2022 and she has experienced increased  symptoms of anxiety and depression and acute grief She feels like lexapro helps her mood some  She is trying to stay active and has support from family and community. She denies any thoughts of hurting herself or others. She is able to sleep at night and eats about 2 meals per day.   Patient Active Problem List   Diagnosis Date Noted  . Medicare annual wellness visit, subsequent 03/12/2016  . Routine general medical examination at a health care facility 03/12/2016  . Essential hypertension 12/21/2015  . HLD (hyperlipidemia) 12/21/2015  . Diastolic dysfunction 51/10/5850  . History of TIA (transient ischemic attack) 10/01/2015  . Protein-calorie malnutrition, severe ([place]) 05/09/2015  . TIA (transient ischemic attack) 05/07/2015  . Sinus tachycardia 04/09/2015  . Orthostatic hypotension 03/26/2015  . Dyspnea  on exertion 06/17/2013  . Asymptomatic PVCs 12/09/2012  . Weight loss, non-intentional 12/09/2012  . Benign hypertensive heart disease without heart failure 12/04/2010  . Hypothyroidism 12/04/2010  . Hypercholesterolemia 12/04/2010  . History of breast cancer 12/04/2010  . Depression 12/04/2010    Past Surgical History:  Procedure Laterality Date  . BREAST LUMPECTOMY  1998   left with radiation  . CHOLECYSTECTOMY    . COLON SURGERY    . KNEE ARTHROSCOPY  10/15/05  . TONSILLECTOMY      Family History  Problem Relation Age of Onset  . Hypertension Mother   . Hypertension Brother   . Heart attack Neg Hx   . Stroke Neg Hx     Social History   Socioeconomic History  . Marital status: Married    Spouse name: Not on file  . Number of children: 1  . Years of education: 74  . Highest education level: Not on file  Occupational History  . Not on file  Social Needs  . Financial resource strain: Not on file  . Food insecurity:    Worry: Not on file    Inability: Not on file  . Transportation needs:    Medical: Not on file    Non-medical: Not on file  Tobacco Use  . Smoking status: Never Smoker  . Smokeless tobacco: Never Used  Substance and Sexual Activity  . Alcohol use: No  . Drug use: No  . Sexual activity: Not on file  Lifestyle  . Physical activity:    Days per week: Not on file    Minutes per session: Not on file  .  Stress: Not on file  Relationships  . Social connections:    Talks on phone: Not on file    Gets together: Not on file    Attends religious service: Not on file    Active member of club or organization: Not on file    Attends meetings of clubs or organizations: Not on file    Relationship status: Not on file  . Intimate partner violence:    Fear of current or ex partner: Not on file    Emotionally abused: Not on file    Physically abused: Not on file    Forced sexual activity: Not on file  Other Topics Concern  . Not on file  Social History  Narrative   Fun: Travel    Denies abuse and feels safe at home.      Current Outpatient Medications:  .  acetaminophen (TYLENOL) 500 MG tablet, Take 1,000 mg by mouth every 6 (six) hours as needed for moderate pain., Disp: , Rfl:  .  aspirin EC 81 MG tablet, Take 1 tablet (81 mg total) by mouth daily., Disp: 90 tablet, Rfl: 3 .  atenolol (TENORMIN) 50 MG tablet, TAKE 0.5 TABLETS (25 MG TOTAL) BY MOUTH DAILY., Disp: 45 tablet, Rfl: 3 .  escitalopram (LEXAPRO) 10 MG tablet, Take 10 mg by mouth daily., Disp: , Rfl:  .  Ferrous Sulfate (SLOW FE PO), Take 1 tablet by mouth daily. , Disp: , Rfl:  .  levothyroxine (SYNTHROID, LEVOTHROID) 75 MCG tablet, Take 75 mcg by mouth daily before breakfast. , Disp: , Rfl:  .  pravastatin (PRAVACHOL) 20 MG tablet, Take 1 tablet (20 mg total) by mouth daily at 6 PM., Disp: 90 tablet, Rfl: 3 .  Vitamin D, Ergocalciferol, (DRISDOL) 50000 UNITS CAPS, Take 50,000 Units by mouth every 14 (fourteen) days. , Disp: , Rfl:   Allergies  Allergen Reactions  . Penicillins Other (See Comments) and Itching    Rash      Review of Systems  Constitutional: Negative for fever and malaise/fatigue.  Respiratory: Negative for shortness of breath.   Cardiovascular: Negative for chest pain.  Gastrointestinal: Negative for abdominal pain and nausea.  Musculoskeletal: Positive for joint pain.  Neurological: Negative for weakness.  Psychiatric/Behavioral: Negative for suicidal ideas. The patient does not have insomnia.    Objective  Vitals:   01/14/18 1412  BP: 122/80  Pulse: 75  Resp: 14  Temp: 98.3 F (36.8 C)  TempSrc: Oral  SpO2: 96%  Weight: 118 lb 12.8 oz (53.9 kg)  Height: 5\' 5"  (1.651 m)    Body mass index is 19.77 kg/m.  Physical Exam Vital signs reviewed Constitutional: Patient appears well-developed and well-nourished. No distress.  HENT: Head: Normocephalic and atraumatic. Nose: Nose normal. Mouth/Throat: Oropharynx is clear and moist. No  oropharyngeal exudate.  Eyes: Conjunctivae and EOM are normal. Pupils are equal, round, and reactive to light. No scleral icterus.  Neck: Normal range of motion. Neck supple.  Cardiovascular: Normal rate, regular rhythm and normal heart sounds.  No murmur heard. No BLE edema. Distal pulses intact. Pulmonary/Chest: Effort normal and breath sounds normal. No respiratory distress. Musculoskeletal: Normal range of motion. No gross deformities Neurological: She is alert and oriented to person, place, and time. Coordination, balance, strength, speech are normal. Ambulatory with cane Skin: Skin is warm and dry. No rash noted. No erythema.  Psychiatric: Patient has a normal mood and affect. behavior is normal. Judgment and thought content normal.   PHQ2/9: Depression screen [facility] 2/9  01/14/2018 03/12/2016  Decreased Interest 0 0  Down, Depressed, Hopeless 0 0  PHQ - 2 Score 0 0  Altered sleeping 0 -  Tired, decreased energy 0 -  Change in appetite 0 -  Feeling bad or failure about yourself  0 -  Trouble concentrating 0 -  Moving slowly or fidgety/restless 0 -  Suicidal thoughts 0 -  PHQ-9 Score 0 -    Fall Risk: Fall Risk  01/14/2018 03/12/2016 12/21/2015 07/16/2015  Falls in the past year? No Yes No No  Number falls in past yr: - 1 - -  Injury with Fall? - No - -   Assessment & Plan RTC in 1 year for annual health maintenance Labs up to date by endocrinology available to view in care everywhere  Health maintenance examination Reviewed health maintenance: up to date, vaccinations up to date

## 2018-01-14 NOTE — Patient Instructions (Signed)
I have placed a referral to counseling. Our office will call you to schedule this appointment. You should hear from our office in 7-10 days.  Please return to see me in about 1 year for an annual visit, or sooner if you need me.  It was nice to meet you. Thanks for letting me take care of you today :)   Coping With Loss, Adult People experience loss in many different ways throughout their lives. Events such as moving, changing jobs, and losing friends can create a sense of loss. The loss may be as serious as a major health change, divorce, death of a pet, or death of a loved one. All of these types of loss are likely to create a physical and emotional reaction known as grief. Grief is the result of a major change or an absence of something or someone that you count on. Grief is a normal reaction to loss. How to recognize changes A variety of factors can affect your grieving experience, including:  The nature of your loss.  Your relationship to what or whom you lost.  Your understanding of grief and how to cope with it.  Your support system.  The way that you deal with your grief will affect your ability to function as you normally do. When you are grieving, you may experience:  Numbness, shock, sadness, anxiety, anger, denial, and guilt.  Thoughts about death.  Unexpected crying.  A physical sensation of emptiness in your gut.  Problems sleeping and eating.  Fatigue.  Loss of interest in normal activities.  Dreaming about or imagining seeing the person who died.  A need to remember what or whom you lost.  Difficulty thinking about anything other than your loss for a period of time.  Relief. If you have been expecting the loss for a while, you may feel a sense of relief when it happens.  Where to find support To get support for coping with loss:  Ask your health care provider for help and recommendations, such as grief counseling or therapy.  Think about joining a  support group for people who are coping with loss.  Follow these instructions at home:  Be patient with yourself and others. Allow the grieving process to happen, and remember that grieving takes time. ? It is likely that you may never feel completely done with some grief. You may find a way to move on while still cherishing memories and feelings about your loss. ? Accepting your loss is a process. It can take months or longer to adjust.  Express your feelings in healthy ways, such as: ? Talking with others about your loss. It may be helpful to find others who have had a similar loss, such as a support group. ? Writing down your feelings in a journal. ? Doing physical activities to release stress and emotional energy. ? Doing creative activities like painting, sculpting, or playing or listening to music. ? Practicing resilience. This is the ability to recover and adjust after facing challenges. Reading some resources that encourage resilience may help you to learn ways to practice those behaviors.  Keep to your normal routine as much as possible. If you have trouble focusing or doing normal activities, it is acceptable to take some time away from your normal routine.  Spend time with friends and loved ones.  Eat a healthy diet, get plenty of sleep, and rest when you feel tired. Where to find more information: You can find more information about coping with  loss from:  American Society of Clinical Oncology: www.cancer.net  American Psychological Association: TVStereos.ch  Contact a health care provider if:  Your grief is extreme and keeps getting worse.  You have ongoing grief that does not improve.  Your body shows symptoms of grief, such as illness.  You feel depressed, anxious, or lonely. Get help right away if:  You have thoughts about hurting yourself or others. If you ever feel like you may hurt yourself or others, or have thoughts about taking your own life, get help right  away. You can go to your nearest emergency department or call:  Your local emergency services (911 in the [place]  A suicide crisis helpline, such as the [facility] at 249-775-4474. This is open 24 hours a day.  Summary  Grief is a normal part of experiencing a loss. It is the result of a major change or an absence of something or someone that you count on.  The depth of grief and the period of recovery depend on the type of loss as well as your ability to adjust to the change and process your feelings.  Processing grief requires patience and a willingness to accept your feelings and talk about your loss with people who are supportive.  It is important to find resources that work for you and to realize that we are all different when it comes to grief. There is not one single grieving process that works for everyone in the same way.  Be aware that when grief becomes extreme, it can lead to more severe issues like isolation, depression, anxiety, or suicidal thoughts. Talk with your health care provider if you have any of these issues. This information is not intended to replace advice given to you by your health care provider. Make sure you discuss any questions you have with your health care provider. Document Released: 01/01/2017 Document Revised: 01/01/2017 Document Reviewed: 01/01/2017 Elsevier Interactive Patient Education  2018 [employer].

## 2018-01-19 ENCOUNTER — Telehealth: Payer: Self-pay | Admitting: Nurse Practitioner

## 2018-01-19 NOTE — Telephone Encounter (Signed)
-----   Message from Merril Abbe, Liborio Negron Torres sent at 01/19/2018 11:32 AM EDT ----- FYI:  Your patient is scheduled to see Dr. Rexene Edison on 03/01/18. Pt is aware.  Thank you for the referral.

## 2018-03-01 ENCOUNTER — Ambulatory Visit: Payer: Medicare Other | Admitting: Psychology

## 2018-05-28 ENCOUNTER — Other Ambulatory Visit: Payer: Self-pay | Admitting: Interventional Cardiology

## 2018-06-20 ENCOUNTER — Other Ambulatory Visit: Payer: Self-pay | Admitting: Interventional Cardiology

## 2018-07-19 ENCOUNTER — Encounter: Payer: Self-pay | Admitting: Nurse Practitioner

## 2018-07-19 ENCOUNTER — Ambulatory Visit: Payer: Medicare Other | Admitting: Nurse Practitioner

## 2018-07-19 VITALS — BP 150/92 | HR 82 | Ht 65.0 in | Wt 122.0 lb

## 2018-07-19 DIAGNOSIS — D649 Anemia, unspecified: Secondary | ICD-10-CM

## 2018-07-19 DIAGNOSIS — G629 Polyneuropathy, unspecified: Secondary | ICD-10-CM

## 2018-07-19 DIAGNOSIS — H6121 Impacted cerumen, right ear: Secondary | ICD-10-CM | POA: Diagnosis not present

## 2018-07-19 DIAGNOSIS — R202 Paresthesia of skin: Secondary | ICD-10-CM

## 2018-07-19 DIAGNOSIS — I10 Essential (primary) hypertension: Secondary | ICD-10-CM | POA: Diagnosis not present

## 2018-07-19 DIAGNOSIS — Z23 Encounter for immunization: Secondary | ICD-10-CM | POA: Diagnosis not present

## 2018-07-19 HISTORY — DX: Polyneuropathy, unspecified: G62.9

## 2018-07-19 HISTORY — DX: Anemia, unspecified: D64.9

## 2018-07-19 NOTE — Progress Notes (Signed)
Vanessa Clayton is a 84 y.o. female with the following history as recorded in Epic[employer]:  Patient Active Problem List   Diagnosis Date Noted  . Peripheral neuropathy 07/19/2018  . Anemia 07/19/2018  . Medicare annual wellness visit, subsequent 03/12/2016  . Routine general medical examination at a health care facility 03/12/2016  . Essential hypertension 12/21/2015  . HLD (hyperlipidemia) 12/21/2015  . Diastolic dysfunction 96/29/5284  . History of TIA (transient ischemic attack) 10/01/2015  . Protein-calorie malnutrition, severe ([place]) 05/09/2015  . TIA (transient ischemic attack) 05/07/2015  . Sinus tachycardia 04/09/2015  . Orthostatic hypotension 03/26/2015  . Dyspnea on exertion 06/17/2013  . Asymptomatic PVCs 12/09/2012  . Weight loss, non-intentional 12/09/2012  . Benign hypertensive heart disease without heart failure 12/04/2010  . Hypothyroidism 12/04/2010  . Hypercholesterolemia 12/04/2010  . History of breast cancer 12/04/2010  . Depression 12/04/2010    Current Outpatient Medications  Medication Sig Dispense Refill  . acetaminophen (TYLENOL) 500 MG tablet Take 1,000 mg by mouth every 6 (six) hours as needed for moderate pain.    Marland Kitchen aspirin EC 81 MG tablet Take 1 tablet (81 mg total) by mouth daily. 90 tablet 3  . atenolol (TENORMIN) 50 MG tablet TAKE 0.5 TABLETS (25 MG TOTAL) BY MOUTH DAILY. 45 tablet 0  . escitalopram (LEXAPRO) 10 MG tablet Take 10 mg by mouth daily.    . Ferrous Sulfate (SLOW FE PO) Take 1 tablet by mouth daily.     Marland Kitchen levothyroxine (SYNTHROID, LEVOTHROID) 75 MCG tablet Take 75 mcg by mouth daily before breakfast.     . pravastatin (PRAVACHOL) 20 MG tablet TAKE 1 TABLET(20 MG) BY MOUTH DAILY AT 6 PM 90 tablet 0  . Vitamin D, Ergocalciferol, (DRISDOL) 50000 UNITS CAPS Take 50,000 Units by mouth every 14 (fourteen) days.      No current facility-administered medications for this visit.     Allergies: Penicillins  Past Medical History:  Diagnosis Date  .  Allergy   . Breast cancer ([place])    left  . Chronic anemia   . Chronic anxiety   . History of TIA (transient ischemic attack) 10/01/2015  . HTN (hypertension)   . Hyperthyroidism   . Hypothyroidism    following treatment for hyperthyroidism  . Syncope and collapse   . Vitamin D deficiency     Past Surgical History:  Procedure Laterality Date  . BREAST LUMPECTOMY  1998   left with radiation  . CHOLECYSTECTOMY    . COLON SURGERY    . KNEE ARTHROSCOPY  10/15/05  . TONSILLECTOMY      Family History  Problem Relation Age of Onset  . Hypertension Mother   . Hypertension Brother   . Heart attack Neg Hx   . Stroke Neg Hx     Social History   Tobacco Use  . Smoking status: Never Smoker  . Smokeless tobacco: Never Used  Substance Use Topics  . Alcohol use: No     Subjective:  Ms Laurich is here today, requesting follow up of elevated blood pressure as well as evaluation of tingling and decreased hearing, accompanied by graddaughter to todays visit. She was last seen by her endocrinologist on 07/16/18 for routine follow up, blood pressure was slightly elevated, instructed to have recheck in PCP office. She is maintained on atenolol 50-1/2 tab po daily for HTN, rxed by cardiology, has been on this dosage for some time now, she actually tells me today she realizes that she has been eating the wrong  salt at home recently, eating regular salt instead of the replacement by mistake. Does not regularly check BP at home. She also c/o "Sore stinging pain" to bilateral feet for >1 year. She says her endocrinologist told her to try tylenol for this, helps some, but continues to be a problem for her. She also c/o decreased hearing over past weeks, would like her ears checked, hx cerumen impaction  Denies syncope, headaches, vision changes, chest pain, shortness of breath, nausea, vomiting, edema, skin discoloration.   BP Readings from Last 3 Encounters:  07/19/18 (!) 150/92  01/14/18 122/80   06/17/17 126/64    ROS- See HPI  Objective:  Vitals:   07/19/18 1028 07/19/18 1054  BP: (!) 150/94 (!) 150/92  Pulse: 82   SpO2: 98%   Weight: 122 lb (55.3 kg)   Height: 5\' 5"  (1.651 m)     General: Well developed, well nourished, in no acute distress  Skin : Warm and dry.  Head: Normocephalic and atraumatic  Eyes: Sclera and conjunctiva clear; pupils round and reactive to light; extraocular movements intact  Ears: External normal; left canal clear, tympanic membranes normal; right canal obstructed by cerumen, unable to visualize tympanic membrane  Oropharynx: Pink, supple. No suspicious lesions  Neck: Supple Lungs: Effort normal, no respiratory distress CVS exam: normal rate and regular rhythm.  Musculoskeletal: No deformities; no active joint inflammation  Extremities: No edema, cyanosis, clubbing  Vessels: Symmetric bilaterally  Neurologic: Alert and oriented; speech intact; face symmetrical; moves all extremities well; CNII-XII intact without focal deficit  Psychiatric: Normal mood and affect.   Assessment:  1. Essential hypertension   2. Need for influenza vaccination   3. Paresthesia   4. Anemia, unspecified type   5. Impacted cerumen of right ear   6. Peripheral polyneuropathy     Plan:    Return in about 2 weeks (around 08/02/2018) for F/U- HTN, cerumen impaction.  Orders Placed This Encounter  Procedures  . Flu vaccine HIGH DOSE PF  . ANA    Standing Status:   Future    Standing Expiration Date:   07/19/2019  . Sedimentation rate    Standing Status:   Future    Standing Expiration Date:   07/19/2019  . Vitamin B12    Standing Status:   Future    Standing Expiration Date:   07/20/2019  . Folate    Standing Status:   Future    Standing Expiration Date:   07/20/2019  . Iron and TIBC    Standing Status:   Future    Standing Expiration Date:   07/20/2019  . Ferritin    Standing Status:   Future    Standing Expiration Date:   07/20/2019    Requested  Prescriptions    No prescriptions requested or ordered in this encounter    07/14/18- Tsh, A1c checked by endocrinology - WNL  10/07/17 vitamin D by endocrinology - WNL  Reviewed Health Maintenance: Need for influenza vaccination- Flu vaccine HIGH DOSE PF  Paresthesia Per patient, this has not been worked up, however per last endocrinology visit, she did see neurology in 2012 with Dx peripheral neuropathy, no improvement with trial of gabapentin  Additional labs today F/U with further recommendations pending lab results - ANA; Future - Sedimentation rate; Future - Vitamin B12; Future  Impacted cerumen of right ear Hard, impacted cerumen, unable to irrigate today  She was instructed to use debrox drops and RTC in 2 weeks for repeat irrigation

## 2018-07-19 NOTE — Assessment & Plan Note (Signed)
Additional labs today Per last endocrinology visit, this has been stable since 2003, last colo 2004 per patient, does not take iron daily as instructed - Vitamin B12; Future - Folate; Future - Iron and TIBC; Future - Ferritin; Future

## 2018-07-19 NOTE — Assessment & Plan Note (Signed)
Bp slightly elevated today with report of increased sodium intake  Continue current medications for now, work on lowering salt intake DASH diet discussed, printed on AVS Encouraged to log BP readings at home RTC in 2 weeks for F/U- recheck BP

## 2018-07-19 NOTE — Patient Instructions (Addendum)
Labs downstairs today  Decrease/avoid salt as discussed Please try to check your blood pressure once daily or at least a few times a week, at the same time each day, and keep a log. Please return in 1 month   DASH Eating Plan DASH stands for "Dietary Approaches to Stop Hypertension." The DASH eating plan is a healthy eating plan that has been shown to reduce high blood pressure (hypertension). It may also reduce your risk for type 2 diabetes, heart disease, and stroke. The DASH eating plan may also help with weight loss. What are tips for following this plan? General guidelines  Avoid eating more than 2,300 mg (milligrams) of salt (sodium) a day. If you have hypertension, you may need to reduce your sodium intake to 1,500 mg a day.  Limit alcohol intake to no more than 1 drink a day for nonpregnant women and 2 drinks a day for men. One drink equals 12 oz of beer, 5 oz of wine, or 1 oz of hard liquor.  Work with your health care provider to maintain a healthy body weight or to lose weight. Ask what an ideal weight is for you.  Get at least 30 minutes of exercise that causes your heart to beat faster (aerobic exercise) most days of the week. Activities may include walking, swimming, or biking.  Work with your health care provider or diet and nutrition specialist (dietitian) to adjust your eating plan to your individual calorie needs. Reading food labels  Check food labels for the amount of sodium per serving. Choose foods with less than 5 percent of the Daily Value of sodium. Generally, foods with less than 300 mg of sodium per serving fit into this eating plan.  To find whole grains, look for the word "whole" as the first word in the ingredient list. Shopping  Buy products labeled as "low-sodium" or "no salt added."  Buy fresh foods. Avoid canned foods and premade or frozen meals. Cooking  Avoid adding salt when cooking. Use salt-free seasonings or herbs instead of table salt or sea  salt. Check with your health care provider or pharmacist before using salt substitutes.  Do not fry foods. Cook foods using healthy methods such as baking, boiling, grilling, and broiling instead.  Cook with heart-healthy oils, such as olive, canola, soybean, or sunflower oil. Meal planning   Eat a balanced diet that includes: ? 5 or more servings of fruits and vegetables each day. At each meal, try to fill half of your plate with fruits and vegetables. ? Up to 6-8 servings of whole grains each day. ? Less than 6 oz of lean meat, poultry, or fish each day. A 3-oz serving of meat is about the same size as a deck of cards. One egg equals 1 oz. ? 2 servings of low-fat dairy each day. ? A serving of nuts, seeds, or beans 5 times each week. ? Heart-healthy fats. Healthy fats called Omega-3 fatty acids are found in foods such as flaxseeds and coldwater fish, like sardines, salmon, and mackerel.  Limit how much you eat of the following: ? Canned or prepackaged foods. ? Food that is high in trans fat, such as fried foods. ? Food that is high in saturated fat, such as fatty meat. ? Sweets, desserts, sugary drinks, and other foods with added sugar. ? Full-fat dairy products.  Do not salt foods before eating.  Try to eat at least 2 vegetarian meals each week.  Eat more home-cooked food and less restaurant, buffet,  and fast food.  When eating at a restaurant, ask that your food be prepared with less salt or no salt, if possible. What foods are recommended? The items listed may not be a complete list. Talk with your dietitian about what dietary choices are best for you. Grains Whole-grain or whole-wheat bread. Whole-grain or whole-wheat pasta. Brown rice. [place]. Bulgur. Whole-grain and low-sodium cereals. Pita bread. Low-fat, low-sodium crackers. Whole-wheat flour tortillas. Vegetables Fresh or frozen vegetables (raw, steamed, roasted, or grilled). Low-sodium or reduced-sodium tomato  and vegetable juice. Low-sodium or reduced-sodium tomato sauce and tomato paste. Low-sodium or reduced-sodium canned vegetables. Fruits All fresh, dried, or frozen fruit. Canned fruit in natural juice (without added sugar). Meat and other protein foods Skinless chicken or [place]. Ground chicken or [place]. Pork with fat trimmed off. Fish and seafood. Egg whites. Dried beans, peas, or lentils. Unsalted nuts, nut butters, and seeds. Unsalted canned beans. Lean cuts of beef with fat trimmed off. Low-sodium, lean deli meat. Dairy Low-fat (1%) or fat-free (skim) milk. Fat-free, low-fat, or reduced-fat cheeses. Nonfat, low-sodium ricotta or cottage cheese. Low-fat or nonfat yogurt. Low-fat, low-sodium cheese. Fats and oils Soft margarine without trans fats. Vegetable oil. Low-fat, reduced-fat, or light mayonnaise and salad dressings (reduced-sodium). Canola, safflower, olive, soybean, and sunflower oils. Avocado. Seasoning and other foods Herbs. Spices. Seasoning mixes without salt. Unsalted popcorn and pretzels. Fat-free sweets. What foods are not recommended? The items listed may not be a complete list. Talk with your dietitian about what dietary choices are best for you. Grains Baked goods made with fat, such as croissants, muffins, or some breads. Dry pasta or rice meal packs. Vegetables Creamed or fried vegetables. Vegetables in a cheese sauce. Regular canned vegetables (not low-sodium or reduced-sodium). Regular canned tomato sauce and paste (not low-sodium or reduced-sodium). Regular tomato and vegetable juice (not low-sodium or reduced-sodium). Angie Fava. Olives. Fruits Canned fruit in a light or heavy syrup. Fried fruit. Fruit in cream or butter sauce. Meat and other protein foods Fatty cuts of meat. Ribs. Fried meat. Berniece Salines. Sausage. Bologna and other processed lunch meats. Salami. Fatback. Hotdogs. Bratwurst. Salted nuts and seeds. Canned beans with added salt. Canned or smoked fish. Whole eggs  or egg yolks. Chicken or [place] with skin. Dairy Whole or 2% milk, cream, and half-and-half. Whole or full-fat cream cheese. Whole-fat or sweetened yogurt. Full-fat cheese. Nondairy creamers. Whipped toppings. Processed cheese and cheese spreads. Fats and oils Butter. Stick margarine. Lard. Shortening. Ghee. Bacon fat. Tropical oils, such as coconut, palm kernel, or palm oil. Seasoning and other foods Salted popcorn and pretzels. Onion salt, garlic salt, seasoned salt, table salt, and sea salt. Worcestershire sauce. Tartar sauce. Barbecue sauce. Teriyaki sauce. Soy sauce, including reduced-sodium. Steak sauce. Canned and packaged gravies. Fish sauce. Oyster sauce. Cocktail sauce. Horseradish that you find on the shelf. Ketchup. Mustard. Meat flavorings and tenderizers. Bouillon cubes. Hot sauce and Tabasco sauce. Premade or packaged marinades. Premade or packaged taco seasonings. Relishes. Regular salad dressings. Where to find more information:  National Heart, Lung, and [place]: https://wilson-eaton.com/  American Heart Association: www.heart.org Summary  The DASH eating plan is a healthy eating plan that has been shown to reduce high blood pressure (hypertension). It may also reduce your risk for type 2 diabetes, heart disease, and stroke.  With the DASH eating plan, you should limit salt (sodium) intake to 2,300 mg a day. If you have hypertension, you may need to reduce your sodium intake to 1,500 mg a day.  When on  the DASH eating plan, aim to eat more fresh fruits and vegetables, whole grains, lean proteins, low-fat dairy, and heart-healthy fats.  Work with your health care provider or diet and nutrition specialist (dietitian) to adjust your eating plan to your individual calorie needs. This information is not intended to replace advice given to you by your health care provider. Make sure you discuss any questions you have with your health care provider. Document Released: 08/07/2011  Document Revised: 08/11/2016 Document Reviewed: 08/11/2016 Elsevier Interactive Patient Education  [employer].

## 2018-08-02 ENCOUNTER — Encounter: Payer: Self-pay | Admitting: Interventional Cardiology

## 2018-08-04 ENCOUNTER — Encounter: Payer: Self-pay | Admitting: Nurse Practitioner

## 2018-08-04 ENCOUNTER — Ambulatory Visit: Payer: Medicare Other | Admitting: Nurse Practitioner

## 2018-08-04 VITALS — BP 152/84 | HR 73 | Ht 65.0 in | Wt 119.0 lb

## 2018-08-04 DIAGNOSIS — H6121 Impacted cerumen, right ear: Secondary | ICD-10-CM

## 2018-08-04 DIAGNOSIS — I10 Essential (primary) hypertension: Secondary | ICD-10-CM | POA: Diagnosis not present

## 2018-08-04 NOTE — Progress Notes (Signed)
Vanessa Clayton is a 84 y.o. female with the following history as recorded in EpicCare:  Patient Active Problem List   Diagnosis Date Noted  . Peripheral neuropathy 07/19/2018  . Anemia 07/19/2018  . Medicare annual wellness visit, subsequent 03/12/2016  . Routine general medical examination at a health care facility 03/12/2016  . Essential hypertension 12/21/2015  . HLD (hyperlipidemia) 12/21/2015  . Diastolic dysfunction 23/55/7322  . History of TIA (transient ischemic attack) 10/01/2015  . Protein-calorie malnutrition, severe ([place]) 05/09/2015  . TIA (transient ischemic attack) 05/07/2015  . Sinus tachycardia 04/09/2015  . Orthostatic hypotension 03/26/2015  . Dyspnea on exertion 06/17/2013  . Asymptomatic PVCs 12/09/2012  . Weight loss, non-intentional 12/09/2012  . Benign hypertensive heart disease without heart failure 12/04/2010  . Hypothyroidism 12/04/2010  . Hypercholesterolemia 12/04/2010  . History of breast cancer 12/04/2010  . Depression 12/04/2010    Current Outpatient Medications  Medication Sig Dispense Refill  . acetaminophen (TYLENOL) 500 MG tablet Take 1,000 mg by mouth every 6 (six) hours as needed for moderate pain.    Marland Kitchen aspirin EC 81 MG tablet Take 1 tablet (81 mg total) by mouth daily. 90 tablet 3  . atenolol (TENORMIN) 50 MG tablet TAKE 0.5 TABLETS (25 MG TOTAL) BY MOUTH DAILY. 45 tablet 0  . escitalopram (LEXAPRO) 10 MG tablet Take 10 mg by mouth daily.    . Ferrous Sulfate (SLOW FE PO) Take 1 tablet by mouth daily.     Marland Kitchen levothyroxine (SYNTHROID, LEVOTHROID) 75 MCG tablet Take 75 mcg by mouth daily before breakfast.     . pravastatin (PRAVACHOL) 20 MG tablet TAKE 1 TABLET(20 MG) BY MOUTH DAILY AT 6 PM 90 tablet 0  . Vitamin D, Ergocalciferol, (DRISDOL) 50000 UNITS CAPS Take 50,000 Units by mouth every 14 (fourteen) days.      No current facility-administered medications for this visit.     Allergies: Penicillins  Past Medical History:  Diagnosis Date  .  Allergy   . Breast cancer ([place])    left  . Chronic anemia   . Chronic anxiety   . History of TIA (transient ischemic attack) 10/01/2015  . HTN (hypertension)   . Hyperthyroidism   . Hypothyroidism    following treatment for hyperthyroidism  . Syncope and collapse   . Vitamin D deficiency     Past Surgical History:  Procedure Laterality Date  . BREAST LUMPECTOMY  1998   left with radiation  . CHOLECYSTECTOMY    . COLON SURGERY    . KNEE ARTHROSCOPY  10/15/05  . TONSILLECTOMY      Family History  Problem Relation Age of Onset  . Hypertension Mother   . Hypertension Brother   . Heart attack Neg Hx   . Stroke Neg Hx     Social History   Tobacco Use  . Smoking status: Never Smoker  . Smokeless tobacco: Never Used  Substance Use Topics  . Alcohol use: No     Subjective:  Vanessa Clayton is here today for follow up of HTN, cerumen impaction. She is accompanied by grandson today. I last saw her on 07/19/18, BP was noted to be slightly elevated but she realized at her appointment that she had recently been eating the wrong salt at home, regular salt instead of salt substitute, adding it to all of her foods. No medication changes were made, she was continued on atenolol 50-1/2 tab po daily, and instructed to decrease salt/follow DASH diet She is back today, tells me she  has been avoiding salt, does not check BP readings regularly at home but her granddaughter has a blood pressure cuff and can check for her. She was also noted to have impacted cerumen of right ear at her 07/19/18 OV, unable to irrigate that day, she was instructed to use debrox and return for repeat irrigation, she says she has been using the debrox as instructed since last OV She feels well. Denies syncope, headaches, vision changes, chest pain, shortness of breath, nausea, vomiting, edema, skin discoloration.  BP Readings from Last 3 Encounters:  08/04/18 (!) 152/84  07/19/18 (!) 150/92  01/14/18 122/80    ROS- See  HPI  Objective:  Vitals:   08/04/18 1332 08/04/18 1401  BP: (!) 168/98 (!) 152/84  Pulse: 73   SpO2: 98%   Weight: 119 lb (54 kg)   Height: 5\' 5"  (1.651 m)     General: Well developed, well nourished, in no acute distress  Skin : Warm and dry.  Head: Normocephalic and atraumatic  Eyes: Sclera and conjunctiva clear; pupils round and reactive to light; extraocular movements intact  Ears: External normal; tympanic membranes normal; right TM visualized after cerumen disimpaction Oropharynx: Pink, supple. No suspicious lesions  Neck: Supple Lungs: Effort normal, no respiratory distress CVS exam: normal rate and regular rhythm.  Musculoskeletal: No deformities; no active joint inflammation  Extremities: No edema, cyanosis, clubbing  Vessels: Symmetric bilaterally  Neurologic: Alert and oriented; speech intact; face symmetrical; moves all extremities well; CNII-XII intact without focal deficit  Psychiatric: Normal mood and affect.   Assessment:  1. Essential hypertension   2. Impacted cerumen of right ear     Plan:  She was Reminded to go for labs today which she apparently did not have done at last OV. Return in about 3 months (around 11/03/2018) for follow up- htn.  No orders of the defined types were placed in this encounter.   Requested Prescriptions    No prescriptions requested or ordered in this encounter    Impacted cerumen of right ear irrigation/lavage of right ear with removal of impacted cerumen Patient tolerated well F/u for new or worsening symptoms

## 2018-08-04 NOTE — Assessment & Plan Note (Addendum)
BP remains slightly elevated today, better on second reading Due to her age and being asymptomatic, I do not believe we need to be too aggressive with her blood pressure right now She is going to begin monitoring BP at home and follow up for readings >150/90 at home Again discussed Home management-diet, lifestyle, red flags and return precautions including when to seek immediate care discussed and printed on AVS She will RTC in about 3 months for F/u- recheck BP, or sooner for readings >150/90

## 2018-08-04 NOTE — Patient Instructions (Addendum)
Please try to check your blood pressure once daily or at least a few times a week, at the same time each day, and keep a log. Follow up in 3 months, or sooner for readings >150/90  Hypertension Hypertension is another name for high blood pressure. High blood pressure forces your heart to work harder to pump blood. This can cause problems over time. There are two numbers in a blood pressure reading. There is a top number (systolic) over a bottom number (diastolic). It is best to have a blood pressure below 120/80. Healthy choices can help lower your blood pressure. You may need medicine to help lower your blood pressure if:  Your blood pressure cannot be lowered with healthy choices.  Your blood pressure is higher than 130/80.  Follow these instructions at home: Eating and drinking  If directed, follow the DASH eating plan. This diet includes: ? Filling half of your plate at each meal with fruits and vegetables. ? Filling one quarter of your plate at each meal with whole grains. Whole grains include whole wheat pasta, brown rice, and whole grain bread. ? Eating or drinking low-fat dairy products, such as skim milk or low-fat yogurt. ? Filling one quarter of your plate at each meal with low-fat (lean) proteins. Low-fat proteins include fish, skinless chicken, eggs, beans, and tofu. ? Avoiding fatty meat, cured and processed meat, or chicken with skin. ? Avoiding premade or processed food.  Eat less than 1,500 mg of salt (sodium) a day.  Limit alcohol use to no more than 1 drink a day for nonpregnant women and 2 drinks a day for men. One drink equals 12 oz of beer, 5 oz of wine, or 1 oz of hard liquor. Lifestyle  Work with your doctor to stay at a healthy weight or to lose weight. Ask your doctor what the best weight is for you.  Get at least 30 minutes of exercise that causes your heart to beat faster (aerobic exercise) most days of the week. This may include walking, swimming, or  biking.  Get at least 30 minutes of exercise that strengthens your muscles (resistance exercise) at least 3 days a week. This may include lifting weights or pilates.  Do not use any products that contain nicotine or tobacco. This includes cigarettes and e-cigarettes. If you need help quitting, ask your doctor.  Check your blood pressure at home as told by your doctor.  Keep all follow-up visits as told by your doctor. This is important. Medicines  Take over-the-counter and prescription medicines only as told by your doctor. Follow directions carefully.  Do not skip doses of blood pressure medicine. The medicine does not work as well if you skip doses. Skipping doses also puts you at risk for problems.  Ask your doctor about side effects or reactions to medicines that you should watch for. Contact a doctor if:  You think you are having a reaction to the medicine you are taking.  You have headaches that keep coming back (recurring).  You feel dizzy.  You have swelling in your ankles.  You have trouble with your vision. Get help right away if:  You get a very bad headache.  You start to feel confused.  You feel weak or numb.  You feel faint.  You get very bad pain in your: ? Chest. ? Belly (abdomen).  You throw up (vomit) more than once.  You have trouble breathing. Summary  Hypertension is another name for high blood pressure.  Making  healthy choices can help lower blood pressure. If your blood pressure cannot be controlled with healthy choices, you may need to take medicine. This information is not intended to replace advice given to you by your health care provider. Make sure you discuss any questions you have with your health care provider. Document Released: 02/04/2008 Document Revised: 07/16/2016 Document Reviewed: 07/16/2016 Elsevier Interactive Patient Education  [employer].

## 2018-08-10 NOTE — Progress Notes (Signed)
Cardiology Office Note   Date:  08/11/2018   ID:  Vanessa Clayton, DOB [birth year], MRN [id number]  PCP:  Lance Sell, NP    No chief complaint on file.  HTN  Wt Readings from Last 3 Encounters:  08/11/18 120 lb (54.4 kg)  08/04/18 119 lb (54 kg)  07/19/18 122 lb (55.3 kg)       History of Present Illness: Vanessa Clayton is a 84 y.o. female   saw Dr. Mare Ferrari in the past. She has a past history of essential hypertension and history of hypercholesterolemia. She has a remote history of thyrotoxicosis. She's also had a past history of breast cancer of the left breast.  She had an echocardiogram in 07/04/13 showing an ejection fraction of 55-60% with grade 1 diastolic dysfunction. Her recent lab work has been stable.  She was hospitalized on 05/07/15 for possible TIA. An echocardiogram on 05/08/15 showed moderate LVH and an ejection fraction of 00-93% and no embolic source. She also had normal carotid ultrasound and a normal EEG. In the hospital she was started on statin in the form of pravastatin 20 mg daily.  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  He passed away in 11-18-17.   Since turning 89 in 10/19, her BP has been high.  Systolics consistently above 150 mm Hg. She is to keep a log at home.   Denies : Chest pain. Dizziness. Leg edema. Nitroglycerin use. Orthopnea. Palpitations. Paroxysmal nocturnal dyspnea. Shortness of breath. Syncope.   She reports knee pain.  Not exercising regularly.  She has tried to decrease salt in the past few weeks.    Past Medical History:  Diagnosis Date  . Allergy   . Breast cancer ([place])    left  . Chronic anemia   . Chronic anxiety   . History of TIA (transient ischemic attack) 10/01/2015  . HTN (hypertension)   . Hyperthyroidism   . Hypothyroidism    following treatment for hyperthyroidism  . Syncope and collapse   . Vitamin D deficiency     Past Surgical History:  Procedure Laterality Date  . BREAST  LUMPECTOMY  1998   left with radiation  . CHOLECYSTECTOMY    . COLON SURGERY    . KNEE ARTHROSCOPY  10/15/05  . TONSILLECTOMY       Current Outpatient Medications  Medication Sig Dispense Refill  . acetaminophen (TYLENOL) 500 MG tablet Take 1,000 mg by mouth every 6 (six) hours as needed for moderate pain.    Marland Kitchen aspirin EC 81 MG tablet Take 1 tablet (81 mg total) by mouth daily. 90 tablet 3  . atenolol (TENORMIN) 50 MG tablet TAKE 0.5 TABLETS (25 MG TOTAL) BY MOUTH DAILY. 45 tablet 0  . escitalopram (LEXAPRO) 10 MG tablet Take 10 mg by mouth daily.    . Ferrous Sulfate (SLOW FE PO) Take 1 tablet by mouth daily.     Marland Kitchen levothyroxine (SYNTHROID, LEVOTHROID) 75 MCG tablet Take 75 mcg by mouth daily before breakfast.     . pravastatin (PRAVACHOL) 20 MG tablet TAKE 1 TABLET(20 MG) BY MOUTH DAILY AT 6 PM 90 tablet 0  . Vitamin D, Ergocalciferol, (DRISDOL) 50000 UNITS CAPS Take 50,000 Units by mouth every 14 (fourteen) days.     Marland Kitchen amLODipine (NORVASC) 2.5 MG tablet Take 1 tablet (2.5 mg total) by mouth daily. 90 tablet 3   No current facility-administered medications for this visit.     Allergies:   Penicillins  Social History:  The patient  reports that she has never smoked. She has never used smokeless tobacco. She reports that she does not drink alcohol or use drugs.   Family History:  The patient's family history includes Hypertension in her brother and mother.    ROS:  Please see the history of present illness.   Otherwise, review of systems are positive for increased BP readings.   All other systems are reviewed and negative.    PHYSICAL EXAM: VS:  BP (!) 156/90   Pulse 72   Ht 5\' 5"  (1.651 m)   Wt 120 lb (54.4 kg)   SpO2 99%   BMI 19.97 kg/m  , BMI Body mass index is 19.97 kg/m. GEN: Well nourished, well developed, in no acute distress , frail HEENT: normal  Neck: no JVD, carotid bruits, or masses Cardiac: RRR; no murmurs, rubs, or gallops,no edema  Respiratory:  clear  to auscultation bilaterally, normal work of breathing GI: soft, nontender, nondistended, + BS MS: no deformity or atrophy  Skin: warm and dry, no rash Neuro:  Strength and sensation are intact Psych: euthymic mood, full affect   EKG:   The ekg ordered today demonstrates NSR, no ST changes   Recent Labs: No results found for requested labs within last 8760 hours.   Lipid Panel    Component Value Date/Time   CHOL 106 (L) 10/01/2015 1100   TRIG 80 10/01/2015 1100   HDL 44 (L) 10/01/2015 1100   CHOLHDL 2.4 10/01/2015 1100   VLDL 16 10/01/2015 1100   LDLCALC 46 10/01/2015 1100     Other studies Reviewed: Additional studies/ records that were reviewed today with results demonstrating: labs reviewed, lipids and electrolytes well controlled in 2019.  Results in Care everywhere.   ASSESSMENT AND PLAN:  1. Hypertensive heart disease:  Add amlodipine 2.5 mg daily to regimen.  Follow-up in 2 to 3 weeks in our hypertension clinic.  Amlodipine could be uptitrated if readings are still high. 2. Hyperlipidemia: Continue pravastatin.  Lipids well controlled in August 8341. 3. Diastolic dysfunction: She appears euvolemic.  Minimize salt intake. 4. Prior TIA: Continue aspirin.   Current medicines are reviewed at length with the patient today.  The patient concerns regarding her medicines were addressed.  The following changes have been made:  No change  Labs/ tests ordered today include:   Orders Placed This Encounter  Procedures  . EKG 12-Lead    Recommend 150 minutes/week of aerobic exercise Low fat, low carb, high fiber diet recommended  Disposition:   FU in HTN clinic; 1 year with me   Signed, Larae Grooms, MD  08/11/2018 11:46 AM    [facility] [address], [place], [place]  [address] Phone: [phone]; Fax: [phone]

## 2018-08-11 ENCOUNTER — Ambulatory Visit: Payer: Medicare Other | Admitting: Interventional Cardiology

## 2018-08-11 ENCOUNTER — Encounter: Payer: Self-pay | Admitting: Interventional Cardiology

## 2018-08-11 VITALS — BP 156/90 | HR 72 | Ht 65.0 in | Wt 120.0 lb

## 2018-08-11 DIAGNOSIS — I5189 Other ill-defined heart diseases: Secondary | ICD-10-CM

## 2018-08-11 DIAGNOSIS — Z8673 Personal history of transient ischemic attack (TIA), and cerebral infarction without residual deficits: Secondary | ICD-10-CM

## 2018-08-11 DIAGNOSIS — E782 Mixed hyperlipidemia: Secondary | ICD-10-CM

## 2018-08-11 DIAGNOSIS — I119 Hypertensive heart disease without heart failure: Secondary | ICD-10-CM

## 2018-08-11 MED ORDER — AMLODIPINE BESYLATE 2.5 MG PO TABS: 2.5000 mg | ORAL_TABLET | Freq: Every day | ORAL | 3 refills | Status: DC

## 2018-08-11 NOTE — Patient Instructions (Signed)
Medication Instructions:  Your physician has recommended you make the following change in your medication:   START: amlodipine 2.5 mg tablet: Take 1 tablet by mouth once a day  If you need a refill on your cardiac medications before your next appointment, please call your pharmacy.   Lab work: None Ordered  If you have labs (blood work) drawn today and your tests are completely normal, you will receive your results only by: Marland Kitchen MyChart Message (if you have MyChart) OR . A paper copy in the mail If you have any lab test that is abnormal or we need to change your treatment, we will call you to review the results.  Testing/Procedures: None ordered  Follow-Up: Your physician recommends that you schedule a follow-up appointment in: 2-3 weeks in the Hypertension Clinic for Blood Pressure Management   At [facility], you and your health needs are our priority.  As part of our continuing mission to provide you with exceptional heart care, we have created designated Provider Care Teams.  These Care Teams include your primary Cardiologist (physician) and Advanced Practice Providers (APPs -  Physician Assistants and Nurse Practitioners) who all work together to provide you with the care you need, when you need it. . You will need a follow up appointment in 1 year.  Please call our office 2 months in advance to schedule this appointment.  You may see Casandra Doffing, MD or one of the following Advanced Practice Providers on your designated Care Team:   . Lyda Jester, PA-C . Dayna Dunn, PA-C . Ermalinda Barrios, PA-C  Any Other Special Instructions Will Be Listed Below (If Applicable).

## 2018-08-20 ENCOUNTER — Other Ambulatory Visit: Payer: Self-pay | Admitting: Interventional Cardiology

## 2018-09-02 ENCOUNTER — Ambulatory Visit: Payer: Self-pay

## 2018-09-09 ENCOUNTER — Other Ambulatory Visit: Payer: Self-pay | Admitting: Interventional Cardiology

## 2018-09-14 ENCOUNTER — Ambulatory Visit (INDEPENDENT_AMBULATORY_CARE_PROVIDER_SITE_OTHER): Payer: Medicare Other | Admitting: Pharmacist

## 2018-09-14 VITALS — BP 160/58 | HR 77

## 2018-09-14 DIAGNOSIS — I10 Essential (primary) hypertension: Secondary | ICD-10-CM | POA: Diagnosis not present

## 2018-09-14 MED ORDER — AMLODIPINE BESYLATE 5 MG PO TABS: 5.0000 mg | ORAL_TABLET | Freq: Every day | ORAL | 5 refills | Status: DC

## 2018-09-14 NOTE — Progress Notes (Signed)
Patient ID: AVITAL DANCY                 DOB: 12-31-1925                      MRN: [id number]     HPI: Vanessa Clayton is a 85 y.o. female referred by Dr. Irish Lack to HTN clinic. PMH is significant for HTN, HLD, thyrotoxicosis, breat cancer, CHF, TIA.  At last visit with Dr. Irish Lack on 08/11/18 patient was started on amlodipine 2.5mg  daily due to BP of 156/90. Patient is here today for follow up. She is accompanied by her grandson. She denies any dizziness, light headedness or swelling.  Patient states that she has had a lot of stress in 2019 with the passing of her husband.   Current HTN meds: amlodipine 2.5mg  daily, atenolol 25mg  daily Previously tried: none BP goal: <130/80  Family History: The patient's family history includes Hypertension in her brother and mother.   Social History: The patient  reports that she has never smoked. She has never used smokeless tobacco. She reports that she does not drink alcohol or use drugs.   Diet: recently switched to Mrs Deliah Boston and low Na salt, drinks 1-2 cups of tea a day, caffiene free soda  Exercise: walks with a cain  Home BP readings: Grandson only remembered that the bottom number is usually in the 70's  Wt Readings from Last 3 Encounters:  08/11/18 120 lb (54.4 kg)  08/04/18 119 lb (54 kg)  07/19/18 122 lb (55.3 kg)   BP Readings from Last 3 Encounters:  09/14/18 (!) 160/58  08/11/18 (!) 156/90  08/04/18 (!) 152/84   Pulse Readings from Last 3 Encounters:  09/14/18 77  08/11/18 72  08/04/18 73    Renal function: CrCl cannot be calculated (Patient's most recent lab result is older than the maximum 21 days allowed.).  Past Medical History:  Diagnosis Date  . Allergy   . Breast cancer ([place])    left  . Chronic anemia   . Chronic anxiety   . History of TIA (transient ischemic attack) 10/01/2015  . HTN (hypertension)   . Hyperthyroidism   . Hypothyroidism    following treatment for hyperthyroidism  . Syncope and collapse     . Vitamin D deficiency     Current Outpatient Medications on File Prior to Visit  Medication Sig Dispense Refill  . acetaminophen (TYLENOL) 500 MG tablet Take 1,000 mg by mouth every 6 (six) hours as needed for moderate pain.    Marland Kitchen aspirin EC 81 MG tablet Take 1 tablet (81 mg total) by mouth daily. 90 tablet 3  . atenolol (TENORMIN) 50 MG tablet TAKE 1/2 TABLET BY MOUTH DAILY 45 tablet 3  . escitalopram (LEXAPRO) 10 MG tablet Take 10 mg by mouth daily.    . Ferrous Sulfate (SLOW FE PO) Take 1 tablet by mouth daily.     Marland Kitchen levothyroxine (SYNTHROID, LEVOTHROID) 75 MCG tablet Take 75 mcg by mouth daily before breakfast.     . pravastatin (PRAVACHOL) 20 MG tablet TAKE 1 TABLET(20 MG) BY MOUTH DAILY AT 6 PM 90 tablet 3  . Vitamin D, Ergocalciferol, (DRISDOL) 50000 UNITS CAPS Take 50,000 Units by mouth every 14 (fourteen) days.      No current facility-administered medications on file prior to visit.     Allergies  Allergen Reactions  . Penicillins Other (See Comments) and Itching    Rash     Blood  pressure (!) 160/58, pulse 77.   Assessment/Plan:  1. Hypertension - Blood pressure is above goal of <130/80. Will increase amlodipine to 5mg  daily. Follow up in 1 month for blood pressure check. Encouraged patient to avoid salt, even the 50% less Na salt.   Thank you  Ramond Dial, Pharm.D, [place]  [address] [place], [place] [address]  Phone: [phone]; Fax: [phone]

## 2018-09-14 NOTE — Patient Instructions (Addendum)
Start taking amlodipine 5mg  daily. You can take 2 of the 2.5mg  tablets for a total of 5mg  until you run out. Then pick up your prescription for 5mg  tablets.   Continue taking atenolol 25mg  daily.   Continue to check your blood pressure at home. Record the readings and bring it with you to your next appointment along with your blood pressure meter.  Call us at [phone] with any questions or concerns

## 2018-10-12 ENCOUNTER — Ambulatory Visit (INDEPENDENT_AMBULATORY_CARE_PROVIDER_SITE_OTHER): Payer: Medicare Other | Admitting: Pharmacist

## 2018-10-12 VITALS — BP 112/70 | HR 73

## 2018-10-12 DIAGNOSIS — I10 Essential (primary) hypertension: Secondary | ICD-10-CM

## 2018-10-12 NOTE — Patient Instructions (Signed)
Continue amlodipine 5mg  daily and atenolol 25mg  daily.  Continue to check blood pressure a few days a week. Call me if blood pressure is consistently less than 110/60 or any dizziness, light headedness.  Call us at [phone] with any questions for concerns  Follow up with Dr. Clayton Bibles as directed

## 2018-10-12 NOTE — Progress Notes (Signed)
Patient ID: CITLALLY CAPTAIN                 DOB: 09/14/1925                      MRN: [id number]     HPI: Vanessa Clayton is a 85 y.o. female referred by Dr. Irish Lack to HTN clinic. PMH is significant for HTN, HLD, thyrotoxicosis, breat cancer, CHF, TIA.  At last visit with HTN clinic patient blood pressure was 160/58. Amlodipine was increased to 5mg  daily. Patient presents today for follow up. She is accompanied by her grandson.   Patient denies dizziness, lightheadedness, blurred vision, headaches, or swelling. She has brought in her home blood pressure cuff that her grandson uses to check her blood pressure. Compared to clinic reading, it is accurate.   Current HTN meds: amlodipine 5mg  daily, atenolol 25mg  daily Previously tried: none BP goal: <130/80  Family History: The patient's family history includes Hypertension in her brother and mother.   Social History: The patient  reports that she has never smoked. She has never used smokeless tobacco. She reports that she does not drink alcohol or use drugs.   Diet: recently switched to Mrs Deliah Boston and low Na salt, drinks 1-2 cups of tea a day, caffiene free soda  Exercise: walks with a cain  Home BP readings: 129/80, 120/76, 145/97, 105/65, 113/72, 121/78, 121/76, 108/69, 107/70, 123/79, 94/60,11/73, 132/85, 116/77, 99/62, 97/60 70-80's  Wt Readings from Last 3 Encounters:  08/11/18 120 lb (54.4 kg)  08/04/18 119 lb (54 kg)  07/19/18 122 lb (55.3 kg)   BP Readings from Last 3 Encounters:  09/14/18 (!) 160/58  08/11/18 (!) 156/90  08/04/18 (!) 152/84   Pulse Readings from Last 3 Encounters:  09/14/18 77  08/11/18 72  08/04/18 73    Renal function: CrCl cannot be calculated (Patient's most recent lab result is older than the maximum 21 days allowed.).  Past Medical History:  Diagnosis Date  . Allergy   . Breast cancer ([place])    left  . Chronic anemia   . Chronic anxiety   . History of TIA (transient ischemic attack) 10/01/2015    . HTN (hypertension)   . Hyperthyroidism   . Hypothyroidism    following treatment for hyperthyroidism  . Syncope and collapse   . Vitamin D deficiency     Current Outpatient Medications on File Prior to Visit  Medication Sig Dispense Refill  . acetaminophen (TYLENOL) 500 MG tablet Take 1,000 mg by mouth every 6 (six) hours as needed for moderate pain.    Marland Kitchen amLODipine (NORVASC) 5 MG tablet Take 1 tablet (5 mg total) by mouth daily for 30 days. 30 tablet 5  . aspirin EC 81 MG tablet Take 1 tablet (81 mg total) by mouth daily. 90 tablet 3  . atenolol (TENORMIN) 50 MG tablet TAKE 1/2 TABLET BY MOUTH DAILY 45 tablet 3  . escitalopram (LEXAPRO) 10 MG tablet Take 10 mg by mouth daily.    . Ferrous Sulfate (SLOW FE PO) Take 1 tablet by mouth daily.     Marland Kitchen levothyroxine (SYNTHROID, LEVOTHROID) 75 MCG tablet Take 75 mcg by mouth daily before breakfast.     . pravastatin (PRAVACHOL) 20 MG tablet TAKE 1 TABLET(20 MG) BY MOUTH DAILY AT 6 PM 90 tablet 3  . Vitamin D, Ergocalciferol, (DRISDOL) 50000 UNITS CAPS Take 50,000 Units by mouth every 14 (fourteen) days.      No current facility-administered  medications on file prior to visit.     Allergies  Allergen Reactions  . Penicillins Other (See Comments) and Itching    Rash     There were no vitals taken for this visit.   Assessment/Plan:  1. Hypertension - Blood pressure is at  goal of <130/80. Will continue amlodipine 5mg  daily and atenolol 25mg  daily. Patient did have a few readings at home that were on the lower end. However patient denies any symptoms of low blood pressure. Instructed to continue to check blood pressure at home. If they get readings consistently <110/60 patient/grandson were instructed to call us. Follow up with HTN clinic as needed and Dr. Irish Lack as directed.  Thank you  Ramond Dial, Pharm.D, [place]  [address] [place], [place] [address]  Phone: [phone]; Fax: [phone]

## 2018-10-26 ENCOUNTER — Observation Stay (HOSPITAL_COMMUNITY)
Admission: EM | Admit: 2018-10-26 | Discharge: 2018-10-27 | Disposition: A | Discharge status: Home / Self Care | Payer: Medicare Other | Attending: Internal Medicine | Admitting: Internal Medicine

## 2018-10-26 ENCOUNTER — Other Ambulatory Visit: Payer: Self-pay

## 2018-10-26 ENCOUNTER — Emergency Department (HOSPITAL_COMMUNITY): Payer: Medicare Other

## 2018-10-26 ENCOUNTER — Encounter (HOSPITAL_COMMUNITY): Payer: Self-pay

## 2018-10-26 DIAGNOSIS — G459 Transient cerebral ischemic attack, unspecified: Secondary | ICD-10-CM | POA: Diagnosis not present

## 2018-10-26 DIAGNOSIS — Z7982 Long term (current) use of aspirin: Secondary | ICD-10-CM | POA: Diagnosis not present

## 2018-10-26 DIAGNOSIS — Z79899 Other long term (current) drug therapy: Secondary | ICD-10-CM | POA: Diagnosis not present

## 2018-10-26 DIAGNOSIS — I10 Essential (primary) hypertension: Secondary | ICD-10-CM | POA: Diagnosis not present

## 2018-10-26 DIAGNOSIS — E039 Hypothyroidism, unspecified: Secondary | ICD-10-CM | POA: Diagnosis not present

## 2018-10-26 DIAGNOSIS — E7800 Pure hypercholesterolemia, unspecified: Secondary | ICD-10-CM | POA: Diagnosis present

## 2018-10-26 DIAGNOSIS — R4182 Altered mental status, unspecified: Secondary | ICD-10-CM | POA: Diagnosis present

## 2018-10-26 DIAGNOSIS — F329 Major depressive disorder, single episode, unspecified: Secondary | ICD-10-CM | POA: Diagnosis present

## 2018-10-26 DIAGNOSIS — F32A Depression, unspecified: Secondary | ICD-10-CM | POA: Diagnosis present

## 2018-10-26 LAB — PROTIME-INR
INR: 1 (ref 0.8–1.2)
Prothrombin Time: 13.5 seconds (ref 11.4–15.2)

## 2018-10-26 LAB — CBC
HCT: 32.8 % — ABNORMAL LOW (ref 36.0–46.0)
Hemoglobin: 9.9 g/dL — ABNORMAL LOW (ref 12.0–15.0)
MCH: 24 pg — ABNORMAL LOW (ref 26.0–34.0)
MCHC: 30.2 g/dL (ref 30.0–36.0)
MCV: 79.4 fL — ABNORMAL LOW (ref 80.0–100.0)
PLATELETS: 204 10*3/uL (ref 150–400)
RBC: 4.13 MIL/uL (ref 3.87–5.11)
RDW: 14.3 % (ref 11.5–15.5)
WBC: 7.1 10*3/uL (ref 4.0–10.5)
nRBC: 0 % (ref 0.0–0.2)

## 2018-10-26 LAB — DIFFERENTIAL
Abs Immature Granulocytes: 0.01 10*3/uL (ref 0.00–0.07)
Basophils Absolute: 0 10*3/uL (ref 0.0–0.1)
Basophils Relative: 0 %
Eosinophils Absolute: 0 10*3/uL (ref 0.0–0.5)
Eosinophils Relative: 0 %
Immature Granulocytes: 0 %
Lymphocytes Relative: 20 %
Lymphs Abs: 1.5 10*3/uL (ref 0.7–4.0)
Monocytes Absolute: 0.5 10*3/uL (ref 0.1–1.0)
Monocytes Relative: 7 %
Neutro Abs: 5.1 10*3/uL (ref 1.7–7.7)
Neutrophils Relative %: 73 %

## 2018-10-26 LAB — RAPID URINE DRUG SCREEN, HOSP PERFORMED
Amphetamines: NOT DETECTED
BENZODIAZEPINES: NOT DETECTED
Barbiturates: NOT DETECTED
COCAINE: NOT DETECTED
Opiates: NOT DETECTED
Tetrahydrocannabinol: NOT DETECTED

## 2018-10-26 LAB — ETHANOL: Alcohol, Ethyl (B): <10 mg/dL (ref <10)

## 2018-10-26 LAB — URINALYSIS, ROUTINE W REFLEX MICROSCOPIC
Bilirubin Urine: NEGATIVE
Glucose, UA: NEGATIVE mg/dL
HGB URINE DIPSTICK: NEGATIVE
Ketones, ur: NEGATIVE mg/dL
Leukocytes,Ua: NEGATIVE
Nitrite: NEGATIVE
Protein, ur: NEGATIVE mg/dL
Specific Gravity, Urine: 1.006 (ref 1.005–1.030)
pH: 5 (ref 5.0–8.0)

## 2018-10-26 LAB — COMPREHENSIVE METABOLIC PANEL
ALT: 10 U/L (ref 0–44)
AST: 18 U/L (ref 15–41)
Albumin: 3.6 g/dL (ref 3.5–5.0)
Alkaline Phosphatase: 54 U/L (ref 38–126)
Anion gap: 12 (ref 5–15)
BUN: 22 mg/dL (ref 8–23)
CALCIUM: 9.1 mg/dL (ref 8.9–10.3)
CO2: 21 mmol/L — ABNORMAL LOW (ref 22–32)
Chloride: 103 mmol/L (ref 98–111)
Creatinine, Ser: 1.05 mg/dL — ABNORMAL HIGH (ref 0.44–1.00)
GFR calc non Af Amer: 47 mL/min — ABNORMAL LOW (ref >60)
GFR, EST AFRICAN AMERICAN: 54 mL/min — AB (ref >60)
Glucose, Bld: 108 mg/dL — ABNORMAL HIGH (ref 70–99)
Potassium: 4.2 mmol/L (ref 3.5–5.1)
Sodium: 136 mmol/L (ref 135–145)
Total Bilirubin: 0.5 mg/dL (ref 0.3–1.2)
Total Protein: 7.2 g/dL (ref 6.5–8.1)

## 2018-10-26 LAB — CBG MONITORING, ED: Glucose-Capillary: 82 mg/dL (ref 70–99)

## 2018-10-26 LAB — APTT: aPTT: 29 seconds (ref 24–36)

## 2018-10-26 NOTE — ED Triage Notes (Signed)
Family visited 2-4 pm today. Daughter called at 42 and states patient was "off". Came to her home at 5:45 and states she was acting low affect, not answering appropriately. Once EMS came, everything seemed to have resolved. Denies CP, SOB, fever, chills.  CBG 114 VSS  Hx HTN, TIA, HLD, thyroid disorder  NKDA

## 2018-10-26 NOTE — ED Provider Notes (Addendum)
[facility] EMERGENCY DEPARTMENT Provider Note   CSN: [id number] Arrival date & time: 10/26/18  2058    History   Chief Complaint Chief Complaint  Patient presents with  . Altered Mental Status    HPI Vanessa Clayton is a 85 y.o. female.     Patient is a 85 year old female with a history of anemia, hypertension, hyperlipidemia, diastolic dysfunction, prior TIA, syncope who is presenting today with her family for difficulty speaking.  Patient is accompanied by her daughter who gives most of the history.  Patient is highly functioning and lives at home alone.  Family comes and visits her every day and her daughter speaks with her on the phone all the time.  Her daughter states that around 4:00 today she talked with her on the phone and she did not seem her normal self.  She went over about 545 and brought her dinner.  She states when she got there she was able to walk and had no evidence of facial droop or unilateral weakness.  However she started talking with her and she states that her words were not making sense.  She continued to ask her questions and she continued to struggle to answer.  This went on for approximately 45 minutes and then has since resolved.  Patient states she remembers this and remembers having a really hard time getting her words out.  She denies any chest pain, shortness of breath, abdominal pain.  She has been eating normally and denies any recent medication changes.  She denies any urinary or infectious symptoms.  The history is provided by the patient and a relative.    Past Medical History:  Diagnosis Date  . Allergy   . Breast cancer ([place])    left  . Chronic anemia   . Chronic anxiety   . History of TIA (transient ischemic attack) 10/01/2015  . HTN (hypertension)   . Hyperthyroidism   . Hypothyroidism    following treatment for hyperthyroidism  . Syncope and collapse   . Vitamin D deficiency     Patient Active Problem List   Diagnosis Date Noted  . Peripheral neuropathy 07/19/2018  . Anemia 07/19/2018  . Medicare annual wellness visit, subsequent 03/12/2016  . Routine general medical examination at a health care facility 03/12/2016  . Essential hypertension 12/21/2015  . HLD (hyperlipidemia) 12/21/2015  . Diastolic dysfunction 98/92/1194  . History of TIA (transient ischemic attack) 10/01/2015  . Protein-calorie malnutrition, severe ([place]) 05/09/2015  . TIA (transient ischemic attack) 05/07/2015  . Sinus tachycardia 04/09/2015  . Orthostatic hypotension 03/26/2015  . Dyspnea on exertion 06/17/2013  . Asymptomatic PVCs 12/09/2012  . Weight loss, non-intentional 12/09/2012  . Benign hypertensive heart disease without heart failure 12/04/2010  . Hypothyroidism 12/04/2010  . Hypercholesterolemia 12/04/2010  . History of breast cancer 12/04/2010  . Depression 12/04/2010    Past Surgical History:  Procedure Laterality Date  . BREAST LUMPECTOMY  1998   left with radiation  . CHOLECYSTECTOMY    . COLON SURGERY    . KNEE ARTHROSCOPY  10/15/05  . TONSILLECTOMY       OB History   No obstetric history on file.      Home Medications    Prior to Admission medications   Medication Sig Start Date End Date Taking? Authorizing Provider  acetaminophen (TYLENOL) 500 MG tablet Take 500 mg by mouth every 6 (six) hours as needed for moderate pain.    Yes [provider]  amLODipine (NORVASC)  5 MG tablet Take 1 tablet (5 mg total) by mouth daily for 30 days. 09/14/18 10/26/18 Yes Jettie Booze, MD  aspirin EC 81 MG tablet Take 1 tablet (81 mg total) by mouth daily. 06/17/17  Yes Jettie Booze, MD  atenolol (TENORMIN) 50 MG tablet TAKE 1/2 TABLET BY MOUTH DAILY Patient taking differently: Take 25 mg by mouth daily. (BETA BLOCKER) 08/20/18  Yes Jettie Booze, MD  docusate sodium (COLACE) 100 MG capsule Take 100 mg by mouth daily as needed for mild constipation.   Yes [provider]  escitalopram (LEXAPRO) 10 MG tablet Take 10 mg by mouth daily.   Yes [provider]  Ferrous Sulfate (SLOW FE PO) Take 1 tablet by mouth daily.    Yes [provider]  levothyroxine (SYNTHROID, LEVOTHROID) 75 MCG tablet Take 75 mcg by mouth daily before breakfast.    Yes [provider]  pravastatin (PRAVACHOL) 20 MG tablet TAKE 1 TABLET(20 MG) BY MOUTH DAILY AT 6 PM Patient taking differently: Take 20 mg by mouth daily.  09/09/18  Yes Jettie Booze, MD  Vitamin D, Ergocalciferol, (DRISDOL) 50000 UNITS CAPS Take 50,000 Units by mouth every 14 (fourteen) days.  06/21/11  Yes [provider]    Family History Family History  Problem Relation Age of Onset  . Hypertension Mother   . Hypertension Brother   . Heart attack Neg Hx   . Stroke Neg Hx     Social History Social History   Tobacco Use  . Smoking status: Never Smoker  . Smokeless tobacco: Never Used  Substance Use Topics  . Alcohol use: No  . Drug use: No     Allergies   Penicillins   Review of Systems Review of Systems  All other systems reviewed and are negative.    Physical Exam Updated Vital Signs BP 140/68   Pulse 68   Temp 98.1 F (36.7 C) (Oral)   Resp (!) 25   Ht 5\' 5"  (1.651 m)   Wt 55.3 kg   SpO2 99%   BMI 20.30 kg/m   Physical Exam Vitals signs and nursing note reviewed.  Constitutional:      General: She is not in acute distress.    Appearance: She is well-developed.  HENT:     Head: Normocephalic and atraumatic.  Eyes:     Pupils: Pupils are equal, round, and reactive to light.  Cardiovascular:     Rate and Rhythm: Normal rate and regular rhythm.     Heart sounds: Normal heart sounds. No murmur. No friction rub.  Pulmonary:     Effort: Pulmonary effort is normal.     Breath sounds: Normal breath sounds. No wheezing or rales.  Abdominal:     General: Bowel sounds are normal. There is no distension.     Palpations: Abdomen is soft.      Tenderness: There is no abdominal tenderness. There is no guarding or rebound.  Musculoskeletal: Normal range of motion.        General: No tenderness.     Right lower leg: No edema.     Left lower leg: No edema.     Comments: No edema  Skin:    General: Skin is warm and dry.     Findings: No rash.  Neurological:     General: No focal deficit present.     Mental Status: She is alert and oriented to person, place, and time. Mental status is at baseline.  Cranial Nerves: No cranial nerve deficit, dysarthria or facial asymmetry.     Sensory: No sensory deficit.     Motor: No weakness.     Comments: Normal speech  Psychiatric:        Mood and Affect: Mood normal.        Behavior: Behavior normal.      ED Treatments / Results  Labs (all labs ordered are listed, but only abnormal results are displayed) Labs Reviewed  CBC - Abnormal; Notable for the following components:      Result Value   Hemoglobin 9.9 (*)    HCT 32.8 (*)    MCV 79.4 (*)    MCH 24.0 (*)    All other components within normal limits  COMPREHENSIVE METABOLIC PANEL - Abnormal; Notable for the following components:   CO2 21 (*)    Glucose, Bld 108 (*)    Creatinine, Ser 1.05 (*)    GFR calc non Af Amer 47 (*)    GFR calc Af Amer 54 (*)    All other components within normal limits  URINALYSIS, ROUTINE W REFLEX MICROSCOPIC - Abnormal; Notable for the following components:   Color, Urine STRAW (*)    All other components within normal limits  ETHANOL  PROTIME-INR  APTT  DIFFERENTIAL  RAPID URINE DRUG SCREEN, HOSP PERFORMED  CBG MONITORING, ED    EKG EKG Interpretation  Date/Time:  Tuesday October 26 2018 21:05:43 EST Ventricular Rate:  70 PR Interval:    QRS Duration: 100 QT Interval:  406 QTC Calculation: 439 R Axis:   25 Text Interpretation:  Sinus rhythm No significant change since last tracing Confirmed by Blanchie Dessert [phone]) on 10/26/2018 9:15:21 PM   Radiology Ct Head Wo  Contrast  Result Date: 10/26/2018 CLINICAL DATA:  85 year old female with transient ischemic attack. EXAM: CT HEAD WITHOUT CONTRAST TECHNIQUE: Contiguous axial images were obtained from the base of the skull through the vertex without intravenous contrast. COMPARISON:  Brain MRI dated 05/08/2015 and head CT dated 05/07/2015 FINDINGS: Brain: There is moderate age-related atrophy and chronic microvascular ischemic changes. There is no acute intracranial hemorrhage. No mass effect or midline shift. No extra-axial fluid collection. Vascular: No hyperdense vessel or unexpected calcification. Skull: Normal. Negative for fracture or focal lesion. Sinuses/Orbits: No acute finding. Other: None IMPRESSION: 1. No acute intracranial hemorrhage. 2. Age-related atrophy and chronic microvascular ischemic changes. Electronically Signed   By: Anner Crete M.D.   On: 10/26/2018 22:21    Procedures Procedures (including critical care time)  Medications Ordered in ED Medications - No data to display   Initial Impression / Assessment and Plan / ED Course  I have reviewed the triage vital signs and the nursing notes.  Pertinent labs & imaging results that were available during my care of the patient were reviewed by me and considered in my medical decision making (see chart for details).       Patient presenting today with symptoms concerning for TIA.  She had 30 to 45 minutes of expressive aphasia which has since resolved.  She had no unilateral weakness or facial droop.  Here neurologic exam is within normal limits.  Patient had history concerning for TIA in 2016 and at that time had an echo that showed moderate LVH and ejection fraction of 60 to 83% with no embolic source.  She also had normal carotid ultrasounds at that time and was placed on a statin.  Patient has had no further work-up since that time.  EKG today is within normal limits without significant change.  Labs and head CT are pending.  11:17  PM Labs without acute findings.  CT with age related changes will admit for TIA work up.  Final Clinical Impressions(s) / ED Diagnoses   Final diagnoses:  TIA (transient ischemic attack)    ED Discharge Orders    None       Blanchie Dessert, MD 10/26/18 2317    Blanchie Dessert, MD 10/26/18 [phone]

## 2018-10-27 ENCOUNTER — Observation Stay (HOSPITAL_BASED_OUTPATIENT_CLINIC_OR_DEPARTMENT_OTHER): Payer: Medicare Other

## 2018-10-27 ENCOUNTER — Observation Stay (HOSPITAL_COMMUNITY): Payer: Medicare Other

## 2018-10-27 DIAGNOSIS — G459 Transient cerebral ischemic attack, unspecified: Secondary | ICD-10-CM

## 2018-10-27 DIAGNOSIS — I361 Nonrheumatic tricuspid (valve) insufficiency: Secondary | ICD-10-CM

## 2018-10-27 LAB — ECHOCARDIOGRAM COMPLETE
Height: 65 in
WEIGHTICAEL: 1873.03 [oz_av]

## 2018-10-27 LAB — HEMOGLOBIN A1C
Hgb A1c MFr Bld: 5.5 % (ref 4.8–5.6)
Mean Plasma Glucose: 111.15 mg/dL

## 2018-10-27 LAB — LIPID PANEL
Cholesterol: 174 mg/dL (ref 0–200)
HDL: 49 mg/dL (ref >40)
LDL Cholesterol: 116 mg/dL — ABNORMAL HIGH (ref 0–99)
Total CHOL/HDL Ratio: 3.6 RATIO
Triglycerides: 43 mg/dL (ref <150)
VLDL: 9 mg/dL (ref 0–40)

## 2018-10-27 MED ORDER — ACETAMINOPHEN 160 MG/5ML PO SOLN: 650.0000 mg | ORAL | Status: DC | PRN

## 2018-10-27 MED ORDER — FERROUS SULFATE 325 (65 FE) MG PO TABS
325.0000 mg | ORAL_TABLET | Freq: Every day | ORAL | Status: DC
  Administered 2018-10-27: 325 mg via ORAL
  Filled 2018-10-27: qty 1

## 2018-10-27 MED ORDER — STROKE: EARLY STAGES OF RECOVERY BOOK
Freq: Once | Status: AC
  Administered 2018-10-27: 02:00:00
  Filled 2018-10-27: qty 1

## 2018-10-27 MED ORDER — CLOPIDOGREL BISULFATE 75 MG PO TABS: 75.0000 mg | ORAL_TABLET | Freq: Every day | ORAL | 0 refills | Status: DC

## 2018-10-27 MED ORDER — CLOPIDOGREL BISULFATE 75 MG PO TABS
75.0000 mg | ORAL_TABLET | Freq: Every day | ORAL | Status: DC
  Administered 2018-10-27: 75 mg via ORAL
  Filled 2018-10-27: qty 1

## 2018-10-27 MED ORDER — ASPIRIN EC 81 MG PO TBEC: 81.0000 mg | DELAYED_RELEASE_TABLET | Freq: Every day | ORAL | Status: DC

## 2018-10-27 MED ORDER — SENNOSIDES-DOCUSATE SODIUM 8.6-50 MG PO TABS: 1.0000 | ORAL_TABLET | Freq: Every evening | ORAL | Status: DC | PRN

## 2018-10-27 MED ORDER — ESCITALOPRAM OXALATE 10 MG PO TABS
10.0000 mg | ORAL_TABLET | Freq: Every day | ORAL | Status: DC
  Administered 2018-10-27: 10 mg via ORAL
  Filled 2018-10-27: qty 1

## 2018-10-27 MED ORDER — ACETAMINOPHEN 325 MG PO TABS
650.0000 mg | ORAL_TABLET | ORAL | Status: DC | PRN
  Administered 2018-10-27: 650 mg via ORAL
  Filled 2018-10-27: qty 2

## 2018-10-27 MED ORDER — PRAVASTATIN SODIUM 10 MG PO TABS: 20.0000 mg | ORAL_TABLET | Freq: Every day | ORAL | Status: DC

## 2018-10-27 MED ORDER — ATORVASTATIN CALCIUM 40 MG PO TABS: 40.0000 mg | ORAL_TABLET | Freq: Every day | ORAL | Status: DC

## 2018-10-27 MED ORDER — PRAVASTATIN SODIUM 40 MG PO TABS: 40.0000 mg | ORAL_TABLET | Freq: Every day | ORAL | Status: DC

## 2018-10-27 MED ORDER — PRAVASTATIN SODIUM 20 MG PO TABS: 40.0000 mg | ORAL_TABLET | Freq: Every day | ORAL | 3 refills | Status: DC

## 2018-10-27 MED ORDER — DOCUSATE SODIUM 100 MG PO CAPS: 100.0000 mg | ORAL_CAPSULE | Freq: Every day | ORAL | Status: DC | PRN

## 2018-10-27 MED ORDER — ATENOLOL 25 MG PO TABS
25.0000 mg | ORAL_TABLET | Freq: Every day | ORAL | Status: DC
  Administered 2018-10-27: 25 mg via ORAL
  Filled 2018-10-27: qty 1

## 2018-10-27 MED ORDER — ASPIRIN EC 325 MG PO TBEC
325.0000 mg | DELAYED_RELEASE_TABLET | Freq: Every day | ORAL | Status: DC
  Administered 2018-10-27: 325 mg via ORAL
  Filled 2018-10-27: qty 1

## 2018-10-27 MED ORDER — LEVOTHYROXINE SODIUM 75 MCG PO TABS
75.0000 ug | ORAL_TABLET | Freq: Every day | ORAL | Status: DC
  Administered 2018-10-27: 75 ug via ORAL
  Filled 2018-10-27: qty 1

## 2018-10-27 MED ORDER — ACETAMINOPHEN 650 MG RE SUPP: 650.0000 mg | RECTAL | Status: DC | PRN

## 2018-10-27 MED ORDER — ENOXAPARIN SODIUM 30 MG/0.3ML ~~LOC~~ SOLN
30.0000 mg | SUBCUTANEOUS | Status: DC
  Administered 2018-10-27: 30 mg via SUBCUTANEOUS
  Filled 2018-10-27: qty 0.3

## 2018-10-27 NOTE — Evaluation (Signed)
Physical Therapy Evaluation Patient Details Name: Vanessa Clayton MRN: [id number] DOB: 28-Jul-1926 Today's Date: 10/27/2018   History of Present Illness  Pt is a 85 y/o female admitted secondary to difficulty speaking. Head CT and MRI were both negative. PMH including but not limited to HTN and L breast cancer.    Clinical Impression  Pt presented seated OOB in recliner chair, awake and willing to participate in therapy session. Pt's daughter and grandchildren present throughout session as well. Prior to admission, pt reported that she ambulates with a cane and required assistance with IADLs. Pt's daughter stated that they will be able to provide 24/7 supervision/assistance for a period of time following d/c. Pt currently at supervision to min guard level with use of cane to ambulate. Pt also participated in stair training without difficulty. Pt would continue to benefit from skilled physical therapy services at this time while admitted and after d/c to address the below listed limitations in order to improve overall safety and independence with functional mobility.     Follow Up Recommendations Home health PT;Supervision/Assistance - 24 hour;Other (comment)(initially)    Equipment Recommendations  None recommended by PT    Recommendations for Other Services       Precautions / Restrictions Precautions Precautions: Fall Restrictions Weight Bearing Restrictions: No      Mobility  Bed Mobility Overal bed mobility: Needs Assistance Bed Mobility: Rolling;Supine to Sit;Sit to Supine Rolling: Supervision   Supine to sit: Supervision Sit to supine: Supervision   General bed mobility comments: pt OOB in recliner chair upon arrival  Transfers Overall transfer level: Needs assistance Equipment used: Straight cane Transfers: Sit to/from Stand Sit to Stand: Supervision Stand pivot transfers: Supervision       General transfer comment: supervision for  safety  Ambulation/Gait Ambulation/Gait assistance: Min guard Gait Distance (Feet): 200 Feet(100' x2 with sitting rest break in between) Assistive device: Straight cane Gait Pattern/deviations: Step-through pattern;Decreased stride length;Trunk flexed Gait velocity: decreased   General Gait Details: pt with mild instability but no overt LOB or need for physical assistance, min guard for safety with use of personal cane on R  Stairs Stairs: Yes Stairs assistance: Min guard Stair Management: Two rails;Step to pattern;Forwards Number of Stairs: 4    Wheelchair Mobility    Modified Rankin (Stroke Patients Only)       Balance Overall balance assessment: Needs assistance Sitting-balance support: Feet supported Sitting balance-Leahy Scale: Good     Standing balance support: During functional activity Standing balance-Leahy Scale: Fair Standing balance comment: supervision overall. min guard with higher level dynamic standing balance tasks                             Pertinent Vitals/Pain Pain Assessment: No/denies pain Faces Pain Scale: Hurts little more Pain Location: B knees Pain Descriptors / Indicators: Aching Pain Intervention(s): Limited activity within patient's tolerance;Repositioned    Home Living Family/patient expects to be discharged to:: Private residence Living Arrangements: Other (Comment)(grandchildren in 20's are at home with her during the day) Available Help at Discharge: Family;Available PRN/intermittently Type of Home: House Home Access: Stairs to enter Entrance Stairs-Rails: Right;Left;Can reach both Entrance Stairs-Number of Steps: 2 Home Layout: Two level;Bed/bath upstairs Home Equipment: Cane - single point Additional Comments: pt does not shower at home unless family present to assist     Prior Function Level of Independence: Needs assistance   Gait / Transfers Assistance Needed: uses cane for mobility.  ADL's /  Homemaking  Assistance Needed: daughter assists with shower and family assists with IADLs and cooking as needed        Hand Dominance   Dominant Hand: Right    Extremity/Trunk Assessment   Upper Extremity Assessment Upper Extremity Assessment: Defer to OT evaluation;Overall [facility] for tasks assessed    Lower Extremity Assessment Lower Extremity Assessment: Overall WFL for tasks assessed    Cervical / Trunk Assessment Cervical / Trunk Assessment: Kyphotic  Communication   Communication: No difficulties  Cognition Arousal/Alertness: Awake/alert Behavior During Therapy: WFL for tasks assessed/performed Overall Cognitive Status: Within Functional Limits for tasks assessed                                        General Comments      Exercises     Assessment/Plan    PT Assessment Patient needs continued PT services  PT Problem List Decreased mobility       PT Treatment Interventions DME instruction;Gait training;Stair training;Functional mobility training;Therapeutic activities;Therapeutic exercise;Balance training;Neuromuscular re-education;Patient/family education    PT Goals (Current goals can be found in the Care Plan section)  Acute Rehab PT Goals Patient Stated Goal: to go home and do for myself PT Goal Formulation: With patient/family Time For Goal Achievement: 11/10/18 Potential to Achieve Goals: Good    Frequency Min 3X/week   Barriers to discharge        Co-evaluation               AM-PAC PT "6 Clicks" Mobility  Outcome Measure Help needed turning from your back to your side while in a flat bed without using bedrails?: A Little Help needed moving from lying on your back to sitting on the side of a flat bed without using bedrails?: A Little Help needed moving to and from a bed to a chair (including a wheelchair)?: A Little Help needed standing up from a chair using your arms (e.g., wheelchair or bedside chair)?: A Little Help needed to walk  in hospital room?: A Little Help needed climbing 3-5 steps with a railing? : A Little 6 Click Score: 18    End of Session Equipment Utilized During Treatment: Gait belt Activity Tolerance: Patient tolerated treatment well Patient left: in chair;with call bell/phone within reach;with family/visitor present Nurse Communication: Mobility status PT Visit Diagnosis: Other abnormalities of gait and mobility (R26.89)    Time: 1350-1416 PT Time Calculation (min) (ACUTE ONLY): 26 min   Charges:   PT Evaluation $PT Eval Moderate Complexity: 1 Mod PT Treatments $Gait Training: 8-22 mins        Sherie Don, PT, DPT  Acute Rehabilitation Services Pager [phone] Office [place] 10/27/2018, 3:16 PM

## 2018-10-27 NOTE — Progress Notes (Signed)
Bilateral carotid duplex completed. Preliminary results in Chart review CV Proc. [place] Maeryn Mcgath,RVS 10/27/2018 10:23 AM

## 2018-10-27 NOTE — Progress Notes (Signed)
SLP Cancellation Note  Patient Details Name: Vanessa Clayton MRN: [id number] DOB: 1926/05/12   Cancelled treatment:       Reason Eval/Treat Not Completed: Patient at procedure or test/unavailable. Pt continues to be out of room in procedure. Will continue efforts.    [place] Siren 10/27/2018, 10:14 AM    Orbie Pyo Colvin Caroli.Ed Risk analyst [phone] Office [phone]

## 2018-10-27 NOTE — Evaluation (Signed)
Occupational Therapy Evaluation Patient Details Name: Vanessa Clayton MRN: [id number] DOB: Jun 23, 1926 Today's Date: 10/27/2018    History of Present Illness 85 y.o. female with medical history significant of TIA, hypertension, hyperlipidemia, diastolic congestive heart failure, anemia, thyroid disorder presenting to the hospital for evaluation of difficulty speaking.  History provided by patient and daughter at bedside.  Daughter states patient had a "mini stroke" 3 to 4 years ago.  States today at 5 PM when she called the patient her words were not making sense over the phone.  Daughter then went to see the patient but did not notice any facial droop, slurring of speech, or focal weakness.  Patient was responding to questions but it seemed she was having difficulty finding words.  Symptoms improved before paramedics came to the house.  Patient reports having a slight headache.  Denies having any numbness or tingling.  She takes a baby aspirin daily.    Clinical Impression   Patient presenting with decreased I in self care, balance, functional mobility/transfers, strength, and endurance. Patient reports being mod I with self care and mobility with use of cane PTA. Family present during the day and assist her with IADLs Patient currently functioning at supervision level and pt reports she is close to her baseline. Patient will benefit from acute OT to increase overall independence in the areas of ADLs, functional mobility, and safety awareness in order to safely discharge home with caregivers.    Follow Up Recommendations  Home health OT;Supervision - Intermittent    Equipment Recommendations  None recommended by OT       Precautions / Restrictions Precautions Precautions: Fall      Mobility Bed Mobility Overal bed mobility: Needs Assistance Bed Mobility: Rolling;Supine to Sit;Sit to Supine Rolling: Supervision   Supine to sit: Supervision Sit to supine: Supervision   General bed  mobility comments: min cuing for technique from flat bed  Transfers Overall transfer level: Needs assistance Equipment used: None Transfers: Sit to/from Stand;Stand Pivot Transfers Sit to Stand: Supervision Stand pivot transfers: Supervision       General transfer comment:  close supervision for transfer    Balance Overall balance assessment: Needs assistance Sitting-balance support: Feet supported Sitting balance-Leahy Scale: Good     Standing balance support: During functional activity Standing balance-Leahy Scale: Fair Standing balance comment: supervision overall. min guard with higher level dynamic standing balance tasks        ADL either performed or assessed with clinical judgement   ADL Overall ADL's : Needs assistance/impaired     Grooming: Wash/dry hands;Wash/dry face;Supervision/safety;Standing   Upper Body Bathing: Set up;Sitting   Lower Body Bathing: Min guard;Sit to/from stand   Upper Body Dressing : Set up;Sitting   Lower Body Dressing: Min guard;Sit to/from stand   Toilet Transfer: Supervision/safety   Toileting- Water quality scientist and Hygiene: Supervision/safety   Tub/ Banker: Minimal assistance           Vision Baseline Vision/History: No visual deficits Patient Visual Report: No change from baseline              Pertinent Vitals/Pain Pain Assessment: Faces Faces Pain Scale: Hurts little more Pain Location: B knees Pain Descriptors / Indicators: Aching Pain Intervention(s): Limited activity within patient's tolerance;Repositioned     Hand Dominance Right   Extremity/Trunk Assessment Upper Extremity Assessment Upper Extremity Assessment: Overall WFL for tasks assessed   Lower Extremity Assessment Lower Extremity Assessment: Defer to PT evaluation       Communication Communication Communication:  No difficulties   Cognition Arousal/Alertness: Awake/alert Behavior During Therapy: WFL for tasks  assessed/performed Overall Cognitive Status: Within Functional Limits for tasks assessed                      Home Living Family/patient expects to be discharged to:: Private residence Living Arrangements: Other (Comment)(grandchildren in 20's are at home with her during the day) Available Help at Discharge: Family;Available PRN/intermittently Type of Home: House Home Access: Stairs to enter [employer] of Steps: 2 Entrance Stairs-Rails: Right;Left;Can reach both Home Layout: Two level;Bed/bath upstairs Alternate Level Stairs-Number of Steps: 14- with split landing   Bathroom Shower/Tub: Teacher, early years/pre: Standard Bathroom Accessibility: Yes   Home Equipment: Cane - single point   Additional Comments: pt does not shower at home unless family present to assist       Prior Functioning/Environment Level of Independence: Independent with assistive device(s);Needs assistance  Gait / Transfers Assistance Needed: uses cane for mobility. ADL's / Homemaking Assistance Needed: daughter assists with shower and family assists with IADLs and cooking as needed            OT Problem List: Decreased strength;Decreased knowledge of use of DME or AE;Decreased coordination;Decreased activity tolerance;Impaired balance (sitting and/or standing);Decreased safety awareness;Pain      OT Treatment/Interventions: Self-care/ADL training;Balance training;Therapeutic exercise;Neuromuscular education;Therapeutic activities;Energy conservation;Manual therapy;Patient/family education;DME and/or AE instruction    OT Goals(Current goals can be found in the care plan section) Acute Rehab OT Goals Patient Stated Goal: to go home and do for myself OT Goal Formulation: With patient Time For Goal Achievement: 11/10/18 Potential to Achieve Goals: Good ADL Goals Pt Will Perform Grooming: with modified independence Pt Will Perform Upper Body Bathing: with modified  independence Pt Will Perform Lower Body Bathing: with modified independence Pt Will Perform Upper Body Dressing: with modified independence Pt Will Perform Lower Body Dressing: with modified independence Pt Will Transfer to Toilet: with modified independence Pt Will Perform Toileting - Clothing Manipulation and hygiene: with modified independence  OT Frequency: Min 2X/week   Barriers to D/C: Other (comment)  none known at this time          AM-PAC OT "6 Clicks" Daily Activity     Outcome Measure Help from another person eating meals?: None Help from another person taking care of personal grooming?: None Help from another person toileting, which includes using toliet, bedpan, or urinal?: A Little Help from another person bathing (including washing, rinsing, drying)?: None Help from another person to put on and taking off regular upper body clothing?: A Little Help from another person to put on and taking off regular lower body clothing?: A Little 6 Click Score: 21   End of Session Nurse Communication: Mobility status  Activity Tolerance: Patient tolerated treatment well Patient left: in chair;with call bell/phone within reach;with chair alarm set  OT Visit Diagnosis: Unsteadiness on feet (R26.81)                Time: 1055-1110 OT Time Calculation (min): 15 min Charges:  OT General Charges $OT Visit: 1 Visit OT Evaluation $OT Eval Low Complexity: 1 Low   Mailen Newborn P, MS, OTR/L 10/27/2018, 1:40 PM

## 2018-10-27 NOTE — H&P (Signed)
History and Physical    Vanessa Clayton:[id number] DOB: [birth year] DOA: 10/26/2018  PCP: Lance Sell, NP Patient coming from: Home  Chief Complaint: Difficulty speaking  HPI: Vanessa Clayton is a 85 y.o. female with medical history significant of TIA, hypertension, hyperlipidemia, diastolic congestive heart failure, anemia, thyroid disorder presenting to the hospital for evaluation of difficulty speaking.  History provided by patient and daughter at bedside.  Daughter states patient had a "mini stroke" 3 to 4 years ago.  States today at 5 PM when she called the patient her words were not making sense over the phone.  Daughter then went to see the patient but did not notice any facial droop, slurring of speech, or focal weakness.  Patient was responding to questions but it seemed she was having difficulty finding words.  Symptoms improved before paramedics came to the house.  Patient reports having a slight headache.  Denies having any numbness or tingling.  She takes a baby aspirin daily.  She has never smoked cigarettes.  Review of Systems: As per HPI otherwise 10 point review of systems negative.  Past Medical History:  Diagnosis Date  . Allergy   . Breast cancer ([place])    left  . Chronic anemia   . Chronic anxiety   . History of TIA (transient ischemic attack) 10/01/2015  . HTN (hypertension)   . Hyperthyroidism   . Hypothyroidism    following treatment for hyperthyroidism  . Syncope and collapse   . Vitamin D deficiency     Past Surgical History:  Procedure Laterality Date  . BREAST LUMPECTOMY  1998   left with radiation  . CHOLECYSTECTOMY    . COLON SURGERY    . KNEE ARTHROSCOPY  10/15/05  . TONSILLECTOMY       reports that she has never smoked. She has never used smokeless tobacco. She reports that she does not drink alcohol or use drugs.  Allergies  Allergen Reactions  . Penicillins Other (See Comments) and Itching    Rash     Family History  Problem  Relation Age of Onset  . Hypertension Mother   . Hypertension Brother   . Heart attack Neg Hx   . Stroke Neg Hx     Prior to Admission medications   Medication Sig Start Date End Date Taking? Authorizing Provider  acetaminophen (TYLENOL) 500 MG tablet Take 500 mg by mouth every 6 (six) hours as needed for moderate pain.    Yes [provider]  amLODipine (NORVASC) 5 MG tablet Take 1 tablet (5 mg total) by mouth daily for 30 days. 09/14/18 10/26/18 Yes Jettie Booze, MD  aspirin EC 81 MG tablet Take 1 tablet (81 mg total) by mouth daily. 06/17/17  Yes Jettie Booze, MD  atenolol (TENORMIN) 50 MG tablet TAKE 1/2 TABLET BY MOUTH DAILY Patient taking differently: Take 25 mg by mouth daily. (BETA BLOCKER) 08/20/18  Yes Jettie Booze, MD  docusate sodium (COLACE) 100 MG capsule Take 100 mg by mouth daily as needed for mild constipation.   Yes [provider]  escitalopram (LEXAPRO) 10 MG tablet Take 10 mg by mouth daily.   Yes [provider]  Ferrous Sulfate (SLOW FE PO) Take 1 tablet by mouth daily.    Yes [provider]  levothyroxine (SYNTHROID, LEVOTHROID) 75 MCG tablet Take 75 mcg by mouth daily before breakfast.    Yes [provider]  pravastatin (PRAVACHOL) 20 MG tablet TAKE 1 TABLET(20 MG) BY  MOUTH DAILY AT 6 PM Patient taking differently: Take 20 mg by mouth daily.  09/09/18  Yes Jettie Booze, MD  Vitamin D, Ergocalciferol, (DRISDOL) 50000 UNITS CAPS Take 50,000 Units by mouth every 14 (fourteen) days.  06/21/11  Yes [provider]    Physical Exam: Vitals:   10/26/18 2200 10/26/18 2215 10/26/18 2245 10/27/18 0049  BP: (!) 142/69 135/66 110/80 (!) 145/77  Pulse: 72 74 72 69  Resp: 16 13 18 18   Temp:    98.1 F (36.7 C)  TempSrc:    Oral  SpO2: 100% 100% 100% 100%  Weight:    53.1 kg  Height:    5\' 5"  (1.651 m)    Physical Exam  Constitutional: She is oriented to person, place, and time. She  appears well-developed and well-nourished. No distress.  HENT:  Head: Normocephalic.  Mouth/Throat: Oropharynx is clear and moist.  Eyes: Pupils are equal, round, and reactive to light. EOM are normal.  Neck: Neck supple.  Cardiovascular: Normal rate, regular rhythm and intact distal pulses.  Pulmonary/Chest: Effort normal and breath sounds normal. No respiratory distress. She has no wheezes. She has no rales.  Abdominal: Soft. Bowel sounds are normal. She exhibits no distension. There is no abdominal tenderness. There is no guarding.  Musculoskeletal:        General: No edema.  Neurological: She is alert and oriented to person, place, and time. No cranial nerve deficit.  Speech fluent, tongue midline, no facial droop. Strength 5 out of 5 in bilateral upper and lower extremities. Sensation to light touch intact throughout.  Skin: Skin is warm and dry. She is not diaphoretic.  Psychiatric: She has a normal mood and affect. Her behavior is normal.     Labs on Admission: I have personally reviewed following labs and imaging studies  CBC: Recent Labs  Lab 10/26/18 2220  WBC 7.1  NEUTROABS 5.1  HGB 9.9*  HCT 32.8*  MCV 79.4*  PLT 846   Basic Metabolic Panel: Recent Labs  Lab 10/26/18 2220  NA 136  K 4.2  CL 103  CO2 21*  GLUCOSE 108*  BUN 22  CREATININE 1.05*  CALCIUM 9.1   GFR: Estimated Creatinine Clearance: 29.9 mL/min (A) (by C-G formula based on SCr of 1.05 mg/dL (H)). Liver Function Tests: Recent Labs  Lab 10/26/18 2220  AST 18  ALT 10  ALKPHOS 54  BILITOT 0.5  PROT 7.2  ALBUMIN 3.6   No results for input(s): LIPASE, AMYLASE in the last 168 hours. No results for input(s): AMMONIA in the last 168 hours. Coagulation Profile: Recent Labs  Lab 10/26/18 2220  INR 1.0   Cardiac Enzymes: No results for input(s): CKTOTAL, CKMB, CKMBINDEX, TROPONINI in the last 168 hours. BNP (last 3 results) No results for input(s): PROBNP in the last 8760  hours. HbA1C: No results for input(s): HGBA1C in the last 72 hours. CBG: Recent Labs  Lab 10/26/18 2107  GLUCAP 82   Lipid Profile: No results for input(s): CHOL, HDL, LDLCALC, TRIG, CHOLHDL, LDLDIRECT in the last 72 hours. Thyroid Function Tests: No results for input(s): TSH, T4TOTAL, FREET4, T3FREE, THYROIDAB in the last 72 hours. Anemia Panel: No results for input(s): VITAMINB12, FOLATE, FERRITIN, TIBC, IRON, RETICCTPCT in the last 72 hours. Urine analysis:    Component Value Date/Time   COLORURINE STRAW (A) 10/26/2018 2205   APPEARANCEUR CLEAR 10/26/2018 2205   LABSPEC 1.006 10/26/2018 2205   PHURINE 5.0 10/26/2018 2205   GLUCOSEU NEGATIVE 10/26/2018 2205  HGBUR NEGATIVE 10/26/2018 2205   BILIRUBINUR NEGATIVE 10/26/2018 2205   KETONESUR NEGATIVE 10/26/2018 2205   PROTEINUR NEGATIVE 10/26/2018 2205   UROBILINOGEN 1.0 05/07/2015 2011   NITRITE NEGATIVE 10/26/2018 2205   LEUKOCYTESUR NEGATIVE 10/26/2018 2205    Radiological Exams on Admission: Ct Head Wo Contrast  Result Date: 10/26/2018 CLINICAL DATA:  85 year old female with transient ischemic attack. EXAM: CT HEAD WITHOUT CONTRAST TECHNIQUE: Contiguous axial images were obtained from the base of the skull through the vertex without intravenous contrast. COMPARISON:  Brain MRI dated 05/08/2015 and head CT dated 05/07/2015 FINDINGS: Brain: There is moderate age-related atrophy and chronic microvascular ischemic changes. There is no acute intracranial hemorrhage. No mass effect or midline shift. No extra-axial fluid collection. Vascular: No hyperdense vessel or unexpected calcification. Skull: Normal. Negative for fracture or focal lesion. Sinuses/Orbits: No acute finding. Other: None IMPRESSION: 1. No acute intracranial hemorrhage. 2. Age-related atrophy and chronic microvascular ischemic changes. Electronically Signed   By: Anner Crete M.D.   On: 10/26/2018 22:21    EKG: Independently reviewed.  Sinus rhythm, baseline  wander.  Assessment/Plan Principal Problem:   TIA (transient ischemic attack) Active Problems:   Hypothyroidism   Hypercholesterolemia   Depression   Essential hypertension   TIA -Patient is presenting with complaints of acute onset word finding difficulty.  Symptom onset around 5 PM.  At present, symptoms have resolved and she is back to her baseline.  Has a history of prior TIA.  Additional risk factors include hypertension and hyperlipidemia. -Head CT negative for acute finding. -Currently takes aspirin 81 mg, will increase dose to aspirin 325 mg daily -A1c, lipid panel -Echo -Carotid Dopplers -Brain MRI -Frequent neurochecks -Risk factor modification -PT/OT/SLP -Tylenol PRN headaches  Hypertension -Blood pressure currently stable.  Continue to monitor.  Hyperlipidemia -Currently on pravastatin 20 mg daily, will switch to high intensity statin (Lipitor 40 mg daily)   Hypothyroidism -Continue Synthroid  Chronic microcytic anemia -Hemoglobin 9.9, stable.  Continue home iron supplement.  Depression -Continue Lexapro  DVT prophylaxis: Lovenox Code Status: Patient wishes to be DNR. Family Communication: Daughter and grandson at bedside. Disposition Plan: Anticipate discharge in 1 to 2 days. Consults called: None Admission status: Observation, telemetry   Shela Leff MD [facility] Pager [phone]  If 7PM-7AM, please contact night-coverage [email or web address] Password [email or web address]  10/27/2018, 2:10 AM

## 2018-10-27 NOTE — Care Management Note (Signed)
Case Management Note  Patient Details  Name: Vanessa Clayton MRN: [id number] Date of Birth: [birth year]  Subjective/Objective:    Pt in with TIA. She is from home alone but has family that checks on her daily.  DME at home: cane, walker, shower chair Pt states her daughter fills her medication box. Family provides transportation.                Action/Plan: Pt discharging home with orders for [facility] services. CM provided choice and [facility] selected. [facility] unable to accept the referral. [facility] selected. Tommi Rumps with [facility] accepted the referral.  Family to provide transport home.   Expected Discharge Date:  10/27/18               Expected Discharge Plan:  [place]  In-House Referral:     Discharge planning Services  CM Consult  Post Acute Care Choice:  Home Health Choice offered to:  Adult Children  DME Arranged:    DME Agency:     HH Arranged:  PT, OT HH Agency:  [facility]  Status of Service:  Completed, signed off  If discussed at [place] of Stay Meetings, dates discussed:    Additional Comments:  Pollie Friar, RN 10/27/2018, 3:28 PM

## 2018-10-27 NOTE — Discharge Summary (Signed)
Physician Discharge Summary  Vanessa Clayton ZHG:[id number] DOB: 1926-01-26 DOA: 10/26/2018  PCP: Lance Sell, NP  Admit date: 10/26/2018 Discharge date: 10/27/2018  Admitted From: Home  Disposition:  Home with family  Recommendations for Outpatient Follow-up:  1. Follow up with PCP in 1-2 weeks 2. Please obtain BMP/CBC in one week 3. Needs close follow up with neurology. Post TIA  Home Health: yes  Discharge Condition: stable.  CODE STATUS: DNR Diet recommendation: Heart Healthy   Brief/Interim Summary: 85 year old with past medical history significant for TIA, hypertension, diastolic heart failure who presents for evaluation of difficulty speaking.  Was not able to make sense from her words, she was difficulty finding words.  Patient has been compliant with aspirin.  1-TIA; Patient present with difficulty finding words.  MRI negative for acute stroke. She was taking baby aspirin.  She will be transitioned to Plavix. Echo negative for source of emboli. Carotid Doppler no significant stenosis. Patient back to baseline.  2-HTN: Continue with home blood pressure medication.  3-Hyperlipidemia: LDL 109.  She will be discharged on pravastatin 40 mg daily.  Hypothyroidism: Continue with Synthroid  Depression: Continue with Lexapro Discharge Diagnoses:  Principal Problem:   TIA (transient ischemic attack) Active Problems:   Hypothyroidism   Hypercholesterolemia   Depression   Essential hypertension    Discharge Instructions  Discharge Instructions    Diet - low sodium heart healthy   Complete by:  As directed    Increase activity slowly   Complete by:  As directed      Allergies as of 10/27/2018      Reactions   Penicillins Other (See Comments), Itching   Rash      Medication List    STOP taking these medications   aspirin EC 81 MG tablet     TAKE these medications   acetaminophen 500 MG tablet Commonly known as:  TYLENOL Take 500 mg by mouth every  6 (six) hours as needed for moderate pain.   amLODipine 5 MG tablet Commonly known as:  NORVASC Take 1 tablet (5 mg total) by mouth daily for 30 days.   atenolol 50 MG tablet Commonly known as:  TENORMIN TAKE 1/2 TABLET BY MOUTH DAILY What changed:  additional instructions   clopidogrel 75 MG tablet Commonly known as:  PLAVIX Take 1 tablet (75 mg total) by mouth daily.   docusate sodium 100 MG capsule Commonly known as:  COLACE Take 100 mg by mouth daily as needed for mild constipation.   escitalopram 10 MG tablet Commonly known as:  LEXAPRO Take 10 mg by mouth daily.   levothyroxine 75 MCG tablet Commonly known as:  SYNTHROID, LEVOTHROID Take 75 mcg by mouth daily before breakfast.   pravastatin 20 MG tablet Commonly known as:  PRAVACHOL Take 2 tablets (40 mg total) by mouth daily. What changed:  See the new instructions.   SLOW FE PO Take 1 tablet by mouth daily.   Vitamin D (Ergocalciferol) 1.25 MG (50000 UT) Caps capsule Commonly known as:  DRISDOL Take 50,000 Units by mouth every 14 (fourteen) days.      Follow-up Information    Lance Sell, NP Follow up in 1 week(s).   Specialty:  Nurse Practitioner Why:  you need to follow with your neurologist in 1 week.  Contact information: [place] [address] [phone]          Allergies  Allergen Reactions  . Penicillins Other (See Comments) and Itching  Rash     Consultations: none  Procedures/Studies: Ct Head Wo Contrast  Result Date: 10/26/2018 CLINICAL DATA:  85 year old female with transient ischemic attack. EXAM: CT HEAD WITHOUT CONTRAST TECHNIQUE: Contiguous axial images were obtained from the base of the skull through the vertex without intravenous contrast. COMPARISON:  Brain MRI dated 05/08/2015 and head CT dated 05/07/2015 FINDINGS: Brain: There is moderate age-related atrophy and chronic microvascular ischemic changes. There is no acute intracranial hemorrhage. No  mass effect or midline shift. No extra-axial fluid collection. Vascular: No hyperdense vessel or unexpected calcification. Skull: Normal. Negative for fracture or focal lesion. Sinuses/Orbits: No acute finding. Other: None IMPRESSION: 1. No acute intracranial hemorrhage. 2. Age-related atrophy and chronic microvascular ischemic changes. Electronically Signed   By: Anner Crete M.D.   On: 10/26/2018 22:21   Mr Brain Wo Contrast  Result Date: 10/27/2018 CLINICAL DATA:  Speech difficulty. EXAM: MRI HEAD WITHOUT CONTRAST TECHNIQUE: Multiplanar, multiecho pulse sequences of the brain and surrounding structures were obtained without intravenous contrast. COMPARISON:  Head CT 10/26/2018 and MRI 05/08/2015 FINDINGS: Brain: There is no evidence of acute infarct, intracranial hemorrhage, mass, midline shift, or extra-axial fluid collection. Cerebral atrophy is not greater than expected for age. Patchy cerebral white matter T2 hyperintensities are nonspecific but compatible with mild for age chronic small vessel ischemic disease. Dilated perivascular spaces are present in the basal ganglia and thalami. There may also be a superimposed chronic lacunar infarct in the left thalamus. Vascular: Major intracranial arterial flow voids are preserved. Chronic T2 hyperintensity in the left sigmoid sinus and left jugular bulb, possibly slow flow. Skull and upper cervical spine: No suspicious marrow lesion. Disc degeneration in the cervical spine. Sinuses/Orbits: Unremarkable orbits. Paranasal sinuses and mastoid air cells are clear. Other: None. IMPRESSION: 1. No acute intracranial abnormality. 2. Mild chronic small vessel ischemic disease. Electronically Signed   By: Logan Bores M.D.   On: 10/27/2018 09:50   Vas US Carotid (at [place] Only)  Result Date: 10/27/2018 Carotid Arterial Duplex Study Indications: TIA. Performing Technologist: Toma Copier RVS  Examination Guidelines: A complete evaluation includes B-mode  imaging, spectral Doppler, color Doppler, and power Doppler as needed of all accessible portions of each vessel. Bilateral testing is considered an integral part of a complete examination. Limited examinations for reoccurring indications may be performed as noted.  Right Carotid Findings: +----------+--------+--------+--------+----------------------+-----------------+           PSV cm/sEDV cm/sStenosisDescribe              Comments          +----------+--------+--------+--------+----------------------+-----------------+ CCA Prox  49      8                                     mild intimal wall                                                         changes and  tortuous          +----------+--------+--------+--------+----------------------+-----------------+ CCA Distal62      15                                    mild intimal wall                                                         changes           +----------+--------+--------+--------+----------------------+-----------------+ ICA Prox  53      15              heterogenous,         mild plaque                                         irregular and diffuse                   +----------+--------+--------+--------+----------------------+-----------------+ ICA Mid   73      24                                    mild intimal wall                                                         changes           +----------+--------+--------+--------+----------------------+-----------------+ ICA Distal91      18                                    tortuous          +----------+--------+--------+--------+----------------------+-----------------+ ECA       57      5               heterogenous          mild plaque       +----------+--------+--------+--------+----------------------+-----------------+  +----------+--------+-------+--------+-------------------+           PSV cm/sEDV cmsDescribeArm Pressure (mmHG) +----------+--------+-------+--------+-------------------+ [id number]                                         +----------+--------+-------+--------+-------------------+ +---------+--------+--+--------+-+ VertebralPSV cm/s41EDV cm/s7 +---------+--------+--+--------+-+  Left Carotid Findings: +----------+--------+--------+--------+------------+---------------------------+           PSV cm/sEDV cm/sStenosisDescribe    Comments                    +----------+--------+--------+--------+------------+---------------------------+ CCA Prox  71      18                          mild intimal wall changes   +----------+--------+--------+--------+------------+---------------------------+ CCA Distal78      20  mild intimal wall changes   +----------+--------+--------+--------+------------+---------------------------+ ICA Prox  53      13              heterogenousmild plaque                 +----------+--------+--------+--------+------------+---------------------------+ ICA Mid   91      29                                                      +----------+--------+--------+--------+------------+---------------------------+ ICA Distal77      25                          tortuous                    +----------+--------+--------+--------+------------+---------------------------+ ECA       74      8               heterogenousmild plaque at the origin                                                 and mild intimal wall                                                     changes                     +----------+--------+--------+--------+------------+---------------------------+ +----------+--------+--------+--------+-------------------+ SubclavianPSV cm/sEDV cm/sDescribeArm Pressure (mmHG)  +----------+--------+--------+--------+-------------------+           93                                          +----------+--------+--------+--------+-------------------+  Summary: Right Carotid: Velocities in the right ICA are consistent with a 1-39% stenosis. Left Carotid: Velocities in the left ICA are consistent with a 1-39% stenosis. Vertebrals:  Bilateral vertebral arteries demonstrate antegrade flow. Subclavians: Normal flow hemodynamics were seen in bilateral subclavian              arteries. *See table(s) above for measurements and observations.  Electronically signed by Antony Contras MD on 10/27/2018 at 12:31:07 PM.    Final      Subjective: Alert, eating lunch. Family at bedside report patient is back to baseline.   Discharge Exam: Vitals:   10/27/18 1051 10/27/18 1251  BP: (!) 160/83 118/69  Pulse:    Resp: 12   Temp: (!) 97.5 F (36.4 C) 98.2 F (36.8 C)  SpO2: 100% 100%   Vitals:   10/27/18 0649 10/27/18 0807 10/27/18 1051 10/27/18 1251  BP: 128/74 (!) 122/92 (!) 160/83 118/69  Pulse: 66 69    Resp: 20 18 12    Temp: 98.2 F (36.8 C) 98.2 F (36.8 C) (!) 97.5 F (36.4 C) 98.2 F (36.8 C)  TempSrc: Oral Oral Oral Oral  SpO2: 100% 100% 100% 100%  Weight:      Height:  General: Pt is alert, awake, not in acute distress Cardiovascular: RRR, S1/S2 +, no rubs, no gallops Respiratory: CTA bilaterally, no wheezing, no rhonchi Abdominal: Soft, NT, ND, bowel sounds + Extremities: no edema, no cyanosis    The results of significant diagnostics from this hospitalization (including imaging, microbiology, ancillary and laboratory) are listed below for reference.     Microbiology: No results found for this or any previous visit (from the past 240 hour(s)).   Labs: BNP (last 3 results) No results for input(s): BNP in the last 8760 hours. Basic Metabolic Panel: Recent Labs  Lab 10/26/18 2220  NA 136  K 4.2  CL 103  CO2 21*  GLUCOSE 108*  BUN 22   CREATININE 1.05*  CALCIUM 9.1   Liver Function Tests: Recent Labs  Lab 10/26/18 2220  AST 18  ALT 10  ALKPHOS 54  BILITOT 0.5  PROT 7.2  ALBUMIN 3.6   No results for input(s): LIPASE, AMYLASE in the last 168 hours. No results for input(s): AMMONIA in the last 168 hours. CBC: Recent Labs  Lab 10/26/18 2220  WBC 7.1  NEUTROABS 5.1  HGB 9.9*  HCT 32.8*  MCV 79.4*  PLT 204   Cardiac Enzymes: No results for input(s): CKTOTAL, CKMB, CKMBINDEX, TROPONINI in the last 168 hours. BNP: Invalid input(s): POCBNP CBG: Recent Labs  Lab 10/26/18 2107  GLUCAP 82   D-Dimer No results for input(s): DDIMER in the last 72 hours. Hgb A1c Recent Labs    10/27/18 0434  HGBA1C 5.5   Lipid Profile Recent Labs    10/27/18 0434  CHOL 174  HDL 49  LDLCALC 116*  TRIG 43  CHOLHDL 3.6   Thyroid function studies No results for input(s): TSH, T4TOTAL, T3FREE, THYROIDAB in the last 72 hours.  Invalid input(s): FREET3 Anemia work up No results for input(s): VITAMINB12, FOLATE, FERRITIN, TIBC, IRON, RETICCTPCT in the last 72 hours. Urinalysis    Component Value Date/Time   COLORURINE STRAW (A) 10/26/2018 2205   APPEARANCEUR CLEAR 10/26/2018 2205   LABSPEC 1.006 10/26/2018 2205   PHURINE 5.0 10/26/2018 2205   GLUCOSEU NEGATIVE 10/26/2018 2205   HGBUR NEGATIVE 10/26/2018 2205   BILIRUBINUR NEGATIVE 10/26/2018 2205   KETONESUR NEGATIVE 10/26/2018 2205   PROTEINUR NEGATIVE 10/26/2018 2205   UROBILINOGEN 1.0 05/07/2015 2011   NITRITE NEGATIVE 10/26/2018 2205   LEUKOCYTESUR NEGATIVE 10/26/2018 2205   Sepsis Labs Invalid input(s): PROCALCITONIN,  WBC,  LACTICIDVEN Microbiology No results found for this or any previous visit (from the past 240 hour(s)).   Time coordinating discharge: 35 minutes.   SIGNED:   Elmarie Shiley, MD  [facility] 10/27/2018, 2:17 PM   If 7PM-7AM, please contact night-coverage [email or web address] Password [email or web address]

## 2018-10-27 NOTE — Progress Notes (Signed)
2D Echocardiogram has been performed.  Vanessa Clayton 10/27/2018, 10:13 AM

## 2018-10-28 ENCOUNTER — Telehealth: Payer: Self-pay | Admitting: *Deleted

## 2018-10-28 NOTE — Telephone Encounter (Signed)
Pt was on TCM report d/c 2/26.Marland Kitchen pt already had f/u appt schedule for 11/04/18. Change appt to hosp f/u since appt been schedule. Called pt to complete TCM call and verify appt there was no answer LMOM RTC...Johny Chess

## 2018-10-29 NOTE — Telephone Encounter (Signed)
Transition Care Management Follow-up Telephone Call  Date discharged? 10/27/18   How have you been since you were released from the hospital? Spoke w/daughter Levada Dy) she states mom is doing alright.   Do you understand why you were in the hospital? YES   Do you understand the discharge instructions? YES   Where were you discharged to? Home   Items Reviewed:  Medications reviewed: YES, she states hospital took her off the aspirin, and rx plavix. She stated she didn't know if she need to start plavix now or wait until she see Ashleigh. Inform daughter she can start now. MD did give them rx but she had not had fill. She states she will have it fill today  Allergies reviewed: YES  Dietary changes reviewed: YES, heart healthy  Referrals reviewed: No referral needed   Functional Questionnaire:   Activities of Daily Living (ADLs):   She states she are independent in the following: feeding, continence, grooming and toileting States they require assistance with the following: ambulation, bathing and hygiene and dressing   Any transportation issues/concerns?: NO,daughter states her son and granddaughter will bring her to the appt   Any patient concerns? NO   Confirmed importance and date/time of follow-up visits scheduled YES, appt 11/04/18  Provider Appointment booked with Caesar Chestnut, NP  Confirmed with patient if condition begins to worsen call PCP or go to the ER.  Patient was given the office number and encouraged to call back with question or concerns.  : YES

## 2018-11-04 ENCOUNTER — Encounter: Payer: Self-pay | Admitting: Nurse Practitioner

## 2018-11-04 ENCOUNTER — Telehealth: Payer: Self-pay | Admitting: Nurse Practitioner

## 2018-11-04 ENCOUNTER — Ambulatory Visit: Payer: Medicare Other | Admitting: Nurse Practitioner

## 2018-11-04 ENCOUNTER — Telehealth: Payer: Self-pay | Admitting: *Deleted

## 2018-11-04 VITALS — BP 128/72 | HR 73 | Ht 65.0 in | Wt 118.0 lb

## 2018-11-04 DIAGNOSIS — G459 Transient cerebral ischemic attack, unspecified: Secondary | ICD-10-CM | POA: Diagnosis not present

## 2018-11-04 DIAGNOSIS — I10 Essential (primary) hypertension: Secondary | ICD-10-CM

## 2018-11-04 DIAGNOSIS — E782 Mixed hyperlipidemia: Secondary | ICD-10-CM | POA: Diagnosis not present

## 2018-11-04 NOTE — Telephone Encounter (Signed)
Left detailed message with verbal orders.  

## 2018-11-04 NOTE — Telephone Encounter (Signed)
Copied from [place] [id number]. Topic: Quick Communication - Home Health Verbal Orders >> Nov 04, 2018  8:23 AM Scherrie Gerlach wrote: Caller/Agency: [facility] Callback Number: [phone] Did evaluation only/ no OT needs at this time

## 2018-11-04 NOTE — Patient Instructions (Addendum)
Head downstairs for labs  You will be contacted to schedule follow up with neurology  Please try to check your blood pressure once daily or at least a few times a week, at the same time each day, and keep a log. Follow up for blood pressure readings over 140/90   Transient Ischemic Attack A transient ischemic attack (TIA) is a "warning stroke" that causes stroke-like symptoms that go away quickly. A TIA does not cause lasting damage to the brain. But having a TIA is a sign that you may be at risk for a stroke. Lifestyle changes and medical treatments can help prevent a stroke. It is important to know the symptoms of a TIA and what to do. Get help right away, even if your symptoms go away. The symptoms of a TIA are the same as those of a stroke. They can happen fast, and they usually go away within minutes or hours. They can include:  Weakness or loss of feeling in your face, arm, or leg. This often happens on one side of your body.  Trouble walking.  Trouble moving your arms or legs.  Trouble talking or understanding what people are saying.  Trouble seeing.  Seeing two of one object (double vision).  Feeling dizzy.  Feeling confused.  Loss of balance or coordination.  Feeling sick to your stomach (nauseous) and throwing up (vomiting).  A very bad headache for no reason.  What increases the risk? Certain things may make you more likely to have a TIA. Some of these are things that you can change, such as:  Being very overweight (obese).  Using products that contain nicotine or tobacco, such as cigarettes and e-cigarettes.  Taking birth control pills.  Not being active.  Drinking too much alcohol.  Using drugs. Other risk factors include:  Having an irregular heartbeat (atrial fibrillation).  Being African American or Hispanic.  Having had blood clots, stroke, TIA, or heart attack in the past.  Being a woman with a history of high blood pressure in pregnancy  (preeclampsia).  Being over the age of 90.  Being female.  Having family history of stroke.  Having the following diseases or conditions: ? High blood pressure. ? High cholesterol. ? Diabetes. ? Heart disease. ? Sickle cell disease. ? Sleep apnea. ? Migraine headache. ? Long-term (chronic) diseases that cause soreness and swelling (inflammation). ? Disorders that affect how your blood clots. Follow these instructions at home: Medicines   Take over-the-counter and prescription medicines only as told by your doctor.  If you were told to take aspirin or another medicine to thin your blood, take it exactly as told by your doctor. ? Taking too much of the medicine can cause bleeding. ? Taking too little of the medicine may not work to treat the problem. Eating and drinking   Eat 5 or more servings of fruits and vegetables each day.  Follow instructions from your doctor about your diet. You may need to follow a certain diet to help lower your risk of having a stroke. You may need to: ? Eat a diet that is low in fat and salt. ? Eat foods that contain a lot of fiber. ? Limit the amount of carbohydrates and sugar in your diet.  Limit alcohol intake to 1 drink a day for nonpregnant women and 2 drinks a day for men. One drink equals 12 oz of beer, 5 oz of wine, or 1 oz of hard liquor. General instructions  Keep a healthy weight.  Stay active. Try to get at least 30 minutes of activity on all or most days.  Find out if you have a condition called sleep apnea. Get treatment if needed.  Do not use any products that contain nicotine or tobacco, such as cigarettes and e-cigarettes. If you need help quitting, ask your doctor.  Do not abuse drugs.  Keep all follow-up visits as told by your doctor. This is important. Get help right away if:  You have any signs of stroke. "BE FAST" is an easy way to remember the main warning signs: ? B - Balance. Signs are dizziness, sudden trouble  walking, or loss of balance. ? E - Eyes. Signs are trouble seeing or a sudden change in how you see. ? F - Face. Signs are sudden weakness or loss of feeling of the face, or the face or eyelid drooping on one side. ? A - Arms. Signs are weakness or loss of feeling in an arm. This happens suddenly and usually on one side of the body. ? S - Speech. Signs are sudden trouble speaking, slurred speech, or trouble understanding what people say. ? T - Time. Time to call emergency services. Write down what time symptoms started.  You have other signs of stroke, such as: ? A sudden, very bad headache with no known cause. ? Feeling sick to your stomach (nausea). ? Throwing up (vomiting). ? Jerky movements that you cannot control (seizure). These symptoms may be an emergency. Do not wait to see if the symptoms will go away. Get medical help right away. Call your local emergency services (911 in the [place] Do not drive yourself to the hospital. Summary  A transient ischemic attack (TIA) is a "warning stroke" that causes stroke-like symptoms that go away quickly.  A TIA is a medical emergency. Get help right away, even if your symptoms go away.  A TIA does not cause lasting damage to the brain.  Having a TIA is a sign that you may be at risk for a stroke. Lifestyle changes and medical treatments can help prevent a stroke. This information is not intended to replace advice given to you by your health care provider. Make sure you discuss any questions you have with your health care provider. Document Released: 05/27/2008 Document Revised: 02/23/2018 Document Reviewed: 11/19/2016 Elsevier Interactive Patient Education  2019 [employer].

## 2018-11-04 NOTE — Telephone Encounter (Signed)
Copied from [place] [id number]. Topic: Quick Communication - Home Health Verbal Orders >> Nov 04, 2018  9:10 AM Ivar Drape wrote: Caller/Agency: [facility] Number: [phone] Requesting OT/PT/Skilled Nursing/Social Work: Physical Therapy and adding a nurse for evaluation Frequency: 1w4 eff 10/29/2018

## 2018-11-04 NOTE — Progress Notes (Signed)
Vanessa Clayton is a 85 y.o. female with the following history as recorded in EpicCare:  Patient Active Problem List   Diagnosis Date Noted  . Peripheral neuropathy 07/19/2018  . Anemia 07/19/2018  . Medicare annual wellness visit, subsequent 03/12/2016  . Routine general medical examination at a health care facility 03/12/2016  . Essential hypertension 12/21/2015  . HLD (hyperlipidemia) 12/21/2015  . Diastolic dysfunction 13/04/6577  . History of TIA (transient ischemic attack) 10/01/2015  . Protein-calorie malnutrition, severe ([place]) 05/09/2015  . TIA (transient ischemic attack) 05/07/2015  . Sinus tachycardia 04/09/2015  . Orthostatic hypotension 03/26/2015  . Dyspnea on exertion 06/17/2013  . Asymptomatic PVCs 12/09/2012  . Weight loss, non-intentional 12/09/2012  . Benign hypertensive heart disease without heart failure 12/04/2010  . Hypothyroidism 12/04/2010  . Hypercholesterolemia 12/04/2010  . History of breast cancer 12/04/2010  . Depression 12/04/2010    Current Outpatient Medications  Medication Sig Dispense Refill  . acetaminophen (TYLENOL) 500 MG tablet Take 500 mg by mouth every 6 (six) hours as needed for moderate pain.     Marland Kitchen atenolol (TENORMIN) 50 MG tablet TAKE 1/2 TABLET BY MOUTH DAILY (Patient taking differently: Take 25 mg by mouth daily. (BETA BLOCKER)) 45 tablet 3  . clopidogrel (PLAVIX) 75 MG tablet Take 1 tablet (75 mg total) by mouth daily. 30 tablet 0  . docusate sodium (COLACE) 100 MG capsule Take 100 mg by mouth daily as needed for mild constipation.    Marland Kitchen escitalopram (LEXAPRO) 10 MG tablet Take 10 mg by mouth daily.    . Ferrous Sulfate (SLOW FE PO) Take 1 tablet by mouth daily.     Marland Kitchen levothyroxine (SYNTHROID, LEVOTHROID) 75 MCG tablet Take 75 mcg by mouth daily before breakfast.     . pravastatin (PRAVACHOL) 20 MG tablet Take 2 tablets (40 mg total) by mouth daily. 90 tablet 3  . Vitamin D, Ergocalciferol, (DRISDOL) 50000 UNITS CAPS Take 50,000 Units by  mouth every 14 (fourteen) days.     Marland Kitchen amLODipine (NORVASC) 5 MG tablet Take 1 tablet (5 mg total) by mouth daily for 30 days. 30 tablet 5   No current facility-administered medications for this visit.     Allergies: Penicillins  Past Medical History:  Diagnosis Date  . Allergy   . Breast cancer ([place])    left  . Chronic anemia   . Chronic anxiety   . History of TIA (transient ischemic attack) 10/01/2015  . HTN (hypertension)   . Hyperthyroidism   . Hypothyroidism    following treatment for hyperthyroidism  . Syncope and collapse   . Vitamin D deficiency     Past Surgical History:  Procedure Laterality Date  . BREAST LUMPECTOMY  1998   left with radiation  . CHOLECYSTECTOMY    . COLON SURGERY    . KNEE ARTHROSCOPY  10/15/05  . TONSILLECTOMY      Family History  Problem Relation Age of Onset  . Hypertension Mother   . Hypertension Brother   . Heart attack Neg Hx   . Stroke Neg Hx     Social History   Tobacco Use  . Smoking status: Never Smoker  . Smokeless tobacco: Never Used  Substance Use Topics  . Alcohol use: No     Subjective:  [name] is here today for hospital follow up. She was transported by paramedics to [facility] on 10/26/18 after her daughter called her to check on her that day and felt her speech was slurred. On her arrival  to the ED, her speech had returned to normal, she had no other neuro deficits, she was admitted for TIA. During hospital stay, CT head, MRI were negative for acute findings, ECHO, carotid dopplers were unremarkable, her aspirin was transitioned to plavix. She remained stable and was discharged home on 10/27/18 with the following  Recommendations for Outpatient Follow-up:  1. Follow up with PCP in 1-2 weeks 2. Please obtain BMP/CBC in one week 3. Needs close follow up with neurology. Post TIA She is here today for follow up, accompanied by family member, tells me that shes felt well since home, PT has started to come to her house, shes not had  any further speech changes. She has continued all medications as directed on hospital discharge, including plavix 75 daily, amlodipine 5 and atenolol 25 daily for HTN, pravastatin 40 daily- which was increased from 20 due to elevated LDL of 116 on 2/26 lipid panel, without noted adverse effects. She has been checking BP at home since hospital discharge, with normal readings. Has not heard from neurologist to schedule appointment yet.  BP Readings from Last 3 Encounters:  11/04/18 128/72  10/27/18 118/69  10/12/18 112/70    ROS- See HPI  Objective:  Vitals:   11/04/18 1056 11/04/18 1136  BP: (!) 160/90 128/72  Pulse: 73   SpO2: 99%   Weight: 118 lb (53.5 kg)   Height: 5\' 5"  (1.651 m)     General: Well developed, well nourished, in no acute distress. Ambulatory with cane.  Skin : Warm and dry.  Head: Normocephalic and atraumatic  Eyes: Sclera and conjunctiva clear; pupils round and reactive to light; extraocular movements intact  Oropharynx: Pink, supple. No suspicious lesions  Neck: Supple Lungs: Respirations unlabored; clear to auscultation bilaterally without wheeze, rales, rhonchi  CVS exam: Normal rate and regular rhythm, S1 and S2 normal.  Extremities: No edema, cyanosis, clubbing  Vessels: Symmetric bilaterally  Neurologic: Alert and oriented; speech intact; face symmetrical; moves all extremities well; CNII-XII intact without focal deficit  Psychiatric: Normal mood and affect.  Assessment:  1. TIA (transient ischemic attack)   2. Essential hypertension   3. Mixed hyperlipidemia     Plan:   Return in about 6 months (around 05/07/2019) for routine follow up.

## 2018-11-04 NOTE — Telephone Encounter (Signed)
Ok to give VO for pt/ot/sn/sw at requested frequency 

## 2018-11-04 NOTE — Assessment & Plan Note (Signed)
Continue current medications Continue planned follow up with neurology- referral placed today to help her get back in with them Home management, red flags and return precautions including when to seek immediate/emergency care discussed and printed on AVS - CBC; Future - Basic metabolic panel; Future - Ambulatory referral to Neurology

## 2018-11-04 NOTE — Telephone Encounter (Signed)
Gave verbal orders for below to [place].

## 2018-11-04 NOTE — Telephone Encounter (Signed)
Ok to give VO OT at requested frequency

## 2018-11-04 NOTE — Assessment & Plan Note (Addendum)
Elevated on first reading, normal on second reading today, reported normal readings at home Continue current medications Continue to monitor home readings Also followed by cardiology, which she will continue RTC in 6 months for routine follow up, or sooner for readings >140/90 - CBC; Future - Basic metabolic panel; Future

## 2018-11-04 NOTE — Assessment & Plan Note (Addendum)
Continue pravastatin at current dosage Recheck lipid panel at next F/U visit Also followed by cardiology, which she will continue - CBC; Future - Basic metabolic panel; Future

## 2018-11-14 ENCOUNTER — Other Ambulatory Visit: Payer: Self-pay | Admitting: Interventional Cardiology

## 2018-11-29 ENCOUNTER — Telehealth: Payer: Self-pay | Admitting: Interventional Cardiology

## 2018-11-29 NOTE — Telephone Encounter (Signed)
Pt's daughter calling requesting a refill on Clopidogrel. Pt was prescribed this medication in the hospital. Would Dr. Irish Lack like to refill this medication? Please address

## 2018-11-30 NOTE — Telephone Encounter (Signed)
Patient was started on Plavix while in the hospital for TIA. Will send to Dr. Irish Lack and Neurology to see who would like to fill medicine.

## 2018-12-01 ENCOUNTER — Other Ambulatory Visit: Payer: Self-pay | Admitting: Family

## 2018-12-01 MED ORDER — CLOPIDOGREL BISULFATE 75 MG PO TABS: 75.0000 mg | ORAL_TABLET | Freq: Every day | ORAL | 5 refills | Status: DC

## 2018-12-01 NOTE — Telephone Encounter (Signed)
Please let the patient's daughter know that Plavix has been refilled by our office as requested by neurology. From now on, they need to contact our office for refills of Plavix. Keep planned visit with [facility] for September 2020.

## 2018-12-01 NOTE — Telephone Encounter (Signed)
Called patient's daughter. No answer. No vm. Will try again later. CRM created in case patient ofr daughter calls back.

## 2018-12-01 NOTE — Telephone Encounter (Signed)
Hello team,  - I reviewed patient's chart. TIA workup completed in hospital.  - Agree with plavix for stroke prevention.  - I would request PCP (A Shambley) to refill plavix going forward.  - We can setup a virtual video visit for patient if needed, due to current COVID 19 emergency. I would advise against an in office visit with [place] right now, to minimize exposure risk for patient.  -Andrey Spearman, MD

## 2018-12-01 NOTE — Telephone Encounter (Signed)
Patient's daughter informed of below.

## 2018-12-13 ENCOUNTER — Encounter: Payer: Self-pay | Admitting: *Deleted

## 2018-12-13 ENCOUNTER — Telehealth: Payer: Self-pay | Admitting: *Deleted

## 2018-12-13 NOTE — Telephone Encounter (Signed)
Called daughter, Levada Dy on [place] and advised her that due to current COVID 19 pandemic, our office is severely reducing in person visits in order to minimize the risk to our patients and healthcare providers. We recommend to convert patient's appointment to a video visit. We'll take all precautions to reduce any security or privacy concerns. This will be treated like an office visit, and we will file with her insurance. Levada Dy consented to video visit; she has a tablet to use. We rescheduled for later the same day at her request. Daughter's email is [email or web address]. She understands that the [employer] webex software must be downloaded and operational on the device pt plans to use for the visit. Updated patient's EMR. Advised she'll get e mail with instructions. She  verbalized understanding, appreciation. E mail sent.

## 2018-12-15 ENCOUNTER — Ambulatory Visit (INDEPENDENT_AMBULATORY_CARE_PROVIDER_SITE_OTHER): Payer: Medicare Other | Admitting: Diagnostic Neuroimaging

## 2018-12-15 ENCOUNTER — Other Ambulatory Visit: Payer: Self-pay

## 2018-12-15 ENCOUNTER — Encounter: Payer: Self-pay | Admitting: Diagnostic Neuroimaging

## 2018-12-15 ENCOUNTER — Encounter: Payer: Medicare Other | Admitting: Diagnostic Neuroimaging

## 2018-12-15 ENCOUNTER — Institutional Professional Consult (permissible substitution): Payer: Medicare Other | Admitting: Diagnostic Neuroimaging

## 2018-12-15 DIAGNOSIS — G459 Transient cerebral ischemic attack, unspecified: Secondary | ICD-10-CM | POA: Diagnosis not present

## 2018-12-15 NOTE — Progress Notes (Signed)
ERROR

## 2018-12-15 NOTE — Telephone Encounter (Signed)
Pt daughter(on DPR) has called to inform that they were signed on and could here themselves and were even able to see the time being recorded but they were never able to connect to Dr Leta Baptist.  Daughter is asking for a call from [place] to discuss other options

## 2018-12-15 NOTE — Progress Notes (Signed)
[facility]  PATIENT: Vanessa Clayton DOB: [birth year]  REFERRING CLINICIAN: A Shambley HISTORY FROM: patient  REASON FOR VISIT: new consult (video)   HISTORICAL  CHIEF COMPLAINT:  Chief Complaint  Patient presents with  . Transient Ischemic Attack    HISTORY OF PRESENT ILLNESS:   85 year old female with hypertension, hypercholesterolemia, here for evaluation of TIA.  10/26/2018 patient had new onset of word finding difficulties.  Patient went to the hospital and was admitted for TIA evaluation.  MRI brain, carotid ultrasound and echocardiogram were unremarkable.  Patient was switched from aspirin to Plavix.  Since that time patient is doing well.  No further events.  She was under high stress at the time of the attack.   REVIEW OF SYSTEMS: Full 14 system review of systems performed and negative with exception of: memory loss.   ALLERGIES: Allergies  Allergen Reactions  . Penicillins Other (See Comments) and Itching    Rash     HOME MEDICATIONS: Outpatient Medications Prior to Visit  Medication Sig Dispense Refill  . acetaminophen (TYLENOL) 500 MG tablet Take 500 mg by mouth every 6 (six) hours as needed for moderate pain.     Marland Kitchen amLODipine (NORVASC) 5 MG tablet Take 1 tablet (5 mg total) by mouth daily for 30 days. 30 tablet 5  . atenolol (TENORMIN) 50 MG tablet TAKE 1/2 TABLET BY MOUTH DAILY (Patient taking differently: Take 25 mg by mouth daily. (BETA BLOCKER)) 45 tablet 3  . clopidogrel (PLAVIX) 75 MG tablet Take 1 tablet (75 mg total) by mouth daily. 30 tablet 5  . docusate sodium (COLACE) 100 MG capsule Take 100 mg by mouth daily as needed for mild constipation.    Marland Kitchen escitalopram (LEXAPRO) 10 MG tablet Take 10 mg by mouth daily.    . Ferrous Sulfate (SLOW FE PO) Take 1 tablet by mouth daily.     Marland Kitchen levothyroxine (SYNTHROID, LEVOTHROID) 75 MCG tablet Take 75 mcg by mouth daily before breakfast.     . pravastatin (PRAVACHOL) 20 MG tablet Take 2  tablets (40 mg total) by mouth daily. 90 tablet 3  . Vitamin D, Ergocalciferol, (DRISDOL) 50000 UNITS CAPS Take 50,000 Units by mouth every 14 (fourteen) days.      No facility-administered medications prior to visit.     PAST MEDICAL HISTORY: Past Medical History:  Diagnosis Date  . Allergy   . Breast cancer ([place])    left  . Chronic anemia   . Chronic anxiety   . History of TIA (transient ischemic attack) 2017, 2020  . HTN (hypertension)   . Hyperthyroidism   . Hypothyroidism    following treatment for hyperthyroidism  . Syncope and collapse   . Vitamin D deficiency     PAST SURGICAL HISTORY: Past Surgical History:  Procedure Laterality Date  . BREAST LUMPECTOMY  1998   left with radiation  . CHOLECYSTECTOMY    . COLON SURGERY    . KNEE ARTHROSCOPY  10/15/05  . TONSILLECTOMY      FAMILY HISTORY: Family History  Problem Relation Age of Onset  . Hypertension Mother   . Hypertension Brother   . Heart attack Neg Hx   . Stroke Neg Hx     SOCIAL HISTORY: Social History   Socioeconomic History  . Marital status: Married    Spouse name: Not on file  . Number of children: 1  . Years of education: 59  . Highest education level: Not on file  Occupational History  .  Not on file  Social Needs  . Financial resource strain: Not on file  . Food insecurity:    Worry: Not on file    Inability: Not on file  . Transportation needs:    Medical: Not on file    Non-medical: Not on file  Tobacco Use  . Smoking status: Never Smoker  . Smokeless tobacco: Never Used  Substance and Sexual Activity  . Alcohol use: No  . Drug use: No  . Sexual activity: Not on file  Lifestyle  . Physical activity:    Days per week: Not on file    Minutes per session: Not on file  . Stress: Not on file  Relationships  . Social connections:    Talks on phone: Not on file    Gets together: Not on file    Attends religious service: Not on file    Active member of club or organization: Not  on file    Attends meetings of clubs or organizations: Not on file    Relationship status: Not on file  . Intimate partner violence:    Fear of current or ex partner: Not on file    Emotionally abused: Not on file    Physically abused: Not on file    Forced sexual activity: Not on file  Other Topics Concern  . Not on file  Social History Narrative   Fun: Travel    12/13/2018 lives alone, grandchildren go daily and helps her   Denies abuse and feels safe at home.      PHYSICAL EXAM  Video visit  GENERAL EXAM/CONSTITUTIONAL:  Vitals: There were no vitals filed for this visit.  There is no height or weight on file to calculate BMI. Wt Readings from Last 3 Encounters:  11/04/18 118 lb (53.5 kg)  10/27/18 117 lb 1 oz (53.1 kg)  08/11/18 120 lb (54.4 kg)     Patient is in no distress; well developed, nourished and groomed; neck is supple  NEUROLOGIC: MENTAL STATUS:  No flowsheet data found.  awake, alert, oriented to person, time and place  remote memory intact  normal attention and concentration  language fluent, comprehension intact, naming intact  fund of knowledge appropriate  CRANIAL NERVE:   2nd, 3rd, 4th, 6th - visual fields full to confrontation, extraocular muscles intact, no nystagmus  5th - facial sensation symmetric  7th - facial strength symmetric  8th - hearing intact  11th - shoulder shrug symmetric  12th - tongue protrusion midline  MOTOR:   NO TREMOR, NO DRIFT  COORDINATION:   fine finger movements normal    DIAGNOSTIC DATA (LABS, IMAGING, TESTING) - I reviewed patient records, labs, notes, testing and imaging myself where available.  Lab Results  Component Value Date   WBC 7.1 10/26/2018   HGB 9.9 (L) 10/26/2018   HCT 32.8 (L) 10/26/2018   MCV 79.4 (L) 10/26/2018   PLT 204 10/26/2018      Component Value Date/Time   NA 136 10/26/2018 2220   K 4.2 10/26/2018 2220   CL 103 10/26/2018 2220   CO2 21 (L) 10/26/2018 2220    GLUCOSE 108 (H) 10/26/2018 2220   BUN 22 10/26/2018 2220   CREATININE 1.05 (H) 10/26/2018 2220   CREATININE 1.00 (H) 10/01/2015 1100   CALCIUM 9.1 10/26/2018 2220   PROT 7.2 10/26/2018 2220   ALBUMIN 3.6 10/26/2018 2220   AST 18 10/26/2018 2220   ALT 10 10/26/2018 2220   ALKPHOS 54 10/26/2018 2220   BILITOT  0.5 10/26/2018 2220   GFRNONAA 47 (L) 10/26/2018 2220   GFRAA 54 (L) 10/26/2018 2220   Lab Results  Component Value Date   CHOL 174 10/27/2018   HDL 49 10/27/2018   LDLCALC 116 (H) 10/27/2018   TRIG 43 10/27/2018   CHOLHDL 3.6 10/27/2018   Lab Results  Component Value Date   HGBA1C 5.5 10/27/2018   Lab Results  Component Value Date   VITAMINB12 473 05/09/2015   Lab Results  Component Value Date   TSH 2.448 03/19/2015     10/27/18 MRI brain [I reviewed images myself and agree with interpretation. -[facility]P]  1. No acute intracranial abnormality. 2. Mild chronic small vessel ischemic disease.  10/27/18 TTE   1. The left ventricle has hyperdynamic systolic function, with an ejection fraction of >65%. The cavity size was normal. There is moderately increased left ventricular wall thickness. Left ventricular diastolic Doppler parameters are consistent with  impaired relaxation.  2. The right ventricle has normal systolic function. The cavity was normal. There is no increase in right ventricular wall thickness. Right ventricular systolic pressure is moderately elevated with an estimated pressure of 44.2 mmHg.  3. The mitral valve is normal in structure. Mild calcification of the anterior mitral valve leaflet.  4. The tricuspid valve is normal in structure. Tricuspid valve regurgitation is moderate-severe.  5. The aortic valve is tricuspid.  6. The pulmonic valve was normal in structure.  10/27/18 carotid Right Carotid: Velocities in the right ICA are consistent with a 1-39% stenosis.  Left Carotid: Velocities in the left ICA are consistent with a 1-39% stenosis.   Vertebrals:  Bilateral vertebral arteries demonstrate antegrade flow. Subclavians: Normal flow hemodynamics were seen in bilateral subclavian              arteries.    ASSESSMENT AND PLAN  85 y.o. year old female here with:  Dx: TIA  1. TIA (transient ischemic attack)     Virtual Visit via Video Note  I connected with Vanessa Clayton on 12/15/18 at  2:15 PM EDT by a video enabled telemedicine application and verified that I am speaking with the correct person using two identifiers.   I discussed the limitations of evaluation and management by telemedicine and the availability of in person appointments. The patient expressed understanding and agreed to proceed.   I discussed the assessment and treatment plan with the patient. The patient was provided an opportunity to ask questions and all were answered. The patient agreed with the plan and demonstrated an understanding of the instructions.   The patient was advised to call back or seek an in-person evaluation if the symptoms worsen or if the condition fails to improve as anticipated.  I provided 25 minutes of non-face-to-face time during this encounter.   PLAN:  TIA  - continue clopidogrel 75mg  daily - continue BP control; continue statin - optimize nutrition and physical activity   Return for pending if symptoms worsen or fail to improve, return to PCP.    Penni Bombard, MD 0/62/3762, 8:31 PM Certified in Neurology, Neurophysiology and Neuroimaging  [facility] [address] [place], [place] [address] [phone]

## 2018-12-15 NOTE — Telephone Encounter (Signed)
Called Vanessa Clayton who stated they joined meeting on tablet and then smart phone, however they never connected with Dr Leta Baptist. I advised her that they may need to wait for a bit after appt time. She verbalized understanding, and we rescheduled webex for 2:30 today. I advised her a new e mail was sent with new meeting information. She should disregard previous e mail and use information in new one just sent. Vanessa Clayton  verbalized understanding, appreciation.

## 2019-01-10 NOTE — Progress Notes (Signed)
This encounter was created in error - please disregard.

## 2019-03-24 ENCOUNTER — Other Ambulatory Visit: Payer: Self-pay

## 2019-03-24 DIAGNOSIS — I10 Essential (primary) hypertension: Secondary | ICD-10-CM

## 2019-03-24 MED ORDER — AMLODIPINE BESYLATE 5 MG PO TABS: 5.0000 mg | ORAL_TABLET | Freq: Every day | ORAL | 11 refills | Status: DC

## 2019-04-24 ENCOUNTER — Other Ambulatory Visit: Payer: Self-pay | Admitting: Family

## 2019-05-11 ENCOUNTER — Ambulatory Visit: Payer: Self-pay | Admitting: Nurse Practitioner

## 2019-05-11 ENCOUNTER — Ambulatory Visit: Payer: Medicare Other | Admitting: Internal Medicine

## 2019-05-30 ENCOUNTER — Ambulatory Visit: Payer: Medicare Other | Admitting: Internal Medicine

## 2019-06-15 ENCOUNTER — Other Ambulatory Visit (INDEPENDENT_AMBULATORY_CARE_PROVIDER_SITE_OTHER): Payer: Medicare Other

## 2019-06-15 ENCOUNTER — Other Ambulatory Visit: Payer: Self-pay

## 2019-06-15 ENCOUNTER — Ambulatory Visit (INDEPENDENT_AMBULATORY_CARE_PROVIDER_SITE_OTHER): Payer: Medicare Other | Admitting: Internal Medicine

## 2019-06-15 ENCOUNTER — Encounter: Payer: Self-pay | Admitting: Internal Medicine

## 2019-06-15 VITALS — BP 118/72 | HR 68 | Temp 98.0°F | Ht 65.0 in | Wt 117.0 lb

## 2019-06-15 DIAGNOSIS — Z0001 Encounter for general adult medical examination with abnormal findings: Secondary | ICD-10-CM

## 2019-06-15 DIAGNOSIS — E611 Iron deficiency: Secondary | ICD-10-CM

## 2019-06-15 DIAGNOSIS — D649 Anemia, unspecified: Secondary | ICD-10-CM

## 2019-06-15 DIAGNOSIS — I10 Essential (primary) hypertension: Secondary | ICD-10-CM

## 2019-06-15 DIAGNOSIS — E538 Deficiency of other specified B group vitamins: Secondary | ICD-10-CM | POA: Diagnosis not present

## 2019-06-15 DIAGNOSIS — Z23 Encounter for immunization: Secondary | ICD-10-CM | POA: Diagnosis not present

## 2019-06-15 DIAGNOSIS — H9193 Unspecified hearing loss, bilateral: Secondary | ICD-10-CM

## 2019-06-15 DIAGNOSIS — E559 Vitamin D deficiency, unspecified: Secondary | ICD-10-CM

## 2019-06-15 DIAGNOSIS — E039 Hypothyroidism, unspecified: Secondary | ICD-10-CM

## 2019-06-15 DIAGNOSIS — R739 Hyperglycemia, unspecified: Secondary | ICD-10-CM

## 2019-06-15 HISTORY — DX: Hyperglycemia, unspecified: R73.9

## 2019-06-15 HISTORY — DX: Unspecified hearing loss, bilateral: H91.93

## 2019-06-15 LAB — BASIC METABOLIC PANEL
BUN: 36 mg/dL — ABNORMAL HIGH (ref 6–23)
CO2: 24 mEq/L (ref 19–32)
Calcium: 9.5 mg/dL (ref 8.4–10.5)
Chloride: 104 mEq/L (ref 96–112)
Creatinine, Ser: 1.25 mg/dL — ABNORMAL HIGH (ref 0.40–1.20)
GFR: 48.61 mL/min — ABNORMAL LOW (ref >60.00)
Glucose, Bld: 97 mg/dL (ref 70–99)
Potassium: 4.9 mEq/L (ref 3.5–5.1)
Sodium: 135 mEq/L (ref 135–145)

## 2019-06-15 LAB — CBC WITH DIFFERENTIAL/PLATELET
Basophils Absolute: 0 10*3/uL (ref 0.0–0.1)
Basophils Relative: 0.3 % (ref 0.0–3.0)
Eosinophils Absolute: 0.1 10*3/uL (ref 0.0–0.7)
Eosinophils Relative: 1.5 % (ref 0.0–5.0)
HCT: 31.2 % — ABNORMAL LOW (ref 36.0–46.0)
Hemoglobin: 9.6 g/dL — ABNORMAL LOW (ref 12.0–15.0)
Lymphocytes Relative: 42.6 % (ref 12.0–46.0)
Lymphs Abs: 3 10*3/uL (ref 0.7–4.0)
MCHC: 30.7 g/dL (ref 30.0–36.0)
MCV: 79.4 fl (ref 78.0–100.0)
Monocytes Absolute: 0.5 10*3/uL (ref 0.1–1.0)
Monocytes Relative: 7.5 % (ref 3.0–12.0)
Neutro Abs: 3.4 10*3/uL (ref 1.4–7.7)
Neutrophils Relative %: 48.1 % (ref 43.0–77.0)
Platelets: 220 10*3/uL (ref 150.0–400.0)
RBC: 3.93 Mil/uL (ref 3.87–5.11)
RDW: 14.1 % (ref 11.5–15.5)
WBC: 7 10*3/uL (ref 4.0–10.5)

## 2019-06-15 LAB — LIPID PANEL
Cholesterol: 139 mg/dL (ref 0–200)
HDL: 48.1 mg/dL (ref >39.00)
LDL Cholesterol: 64 mg/dL (ref 0–99)
NonHDL: 91.2
Total CHOL/HDL Ratio: 3
Triglycerides: 136 mg/dL (ref 0.0–149.0)
VLDL: 27.2 mg/dL (ref 0.0–40.0)

## 2019-06-15 LAB — IBC PANEL
Iron: 66 ug/dL (ref 42–145)
Saturation Ratios: 26.6 % (ref 20.0–50.0)
Transferrin: 177 mg/dL — ABNORMAL LOW (ref 212.0–360.0)

## 2019-06-15 LAB — HEPATIC FUNCTION PANEL
ALT: 6 U/L (ref 0–35)
AST: 14 U/L (ref 0–37)
Albumin: 4.2 g/dL (ref 3.5–5.2)
Alkaline Phosphatase: 61 U/L (ref 39–117)
Bilirubin, Direct: 0 mg/dL (ref 0.0–0.3)
Total Bilirubin: 0.2 mg/dL (ref 0.2–1.2)
Total Protein: 7.7 g/dL (ref 6.0–8.3)

## 2019-06-15 LAB — TSH: TSH: 1.13 u[IU]/mL (ref 0.35–4.50)

## 2019-06-15 LAB — VITAMIN D 25 HYDROXY (VIT D DEFICIENCY, FRACTURES): VITD: 55.83 ng/mL (ref 30.00–100.00)

## 2019-06-15 LAB — VITAMIN B12: Vitamin B-12: 322 pg/mL (ref 211–911)

## 2019-06-15 LAB — FERRITIN: Ferritin: 202.5 ng/mL (ref 10.0–291.0)

## 2019-06-15 LAB — T4, FREE: Free T4: 0.91 ng/dL (ref 0.60–1.60)

## 2019-06-15 MED ORDER — PRAVASTATIN SODIUM 40 MG PO TABS: 40.0000 mg | ORAL_TABLET | Freq: Every day | ORAL | 3 refills | Status: DC

## 2019-06-15 MED ORDER — PRENATAL LOW IRON 27-0.8 MG PO TABS: ORAL_TABLET | ORAL | 3 refills | Status: DC

## 2019-06-15 MED ORDER — AMLODIPINE BESYLATE 5 MG PO TABS: 5.0000 mg | ORAL_TABLET | Freq: Every day | ORAL | 3 refills | Status: DC

## 2019-06-15 MED ORDER — CLOPIDOGREL BISULFATE 75 MG PO TABS: ORAL_TABLET | ORAL | 3 refills | Status: AC

## 2019-06-15 MED ORDER — LEVOTHYROXINE SODIUM 75 MCG PO TABS: 75.0000 ug | ORAL_TABLET | Freq: Every day | ORAL | 3 refills | Status: DC

## 2019-06-15 MED ORDER — ESCITALOPRAM OXALATE 10 MG PO TABS: 10.0000 mg | ORAL_TABLET | Freq: Every day | ORAL | 3 refills | Status: AC

## 2019-06-15 NOTE — Assessment & Plan Note (Signed)
stable overall by history and exam, recent data reviewed with pt, and pt to continue medical treatment as before,  to f/u any worsening symptoms or concerns  

## 2019-06-15 NOTE — Assessment & Plan Note (Signed)
For f/u lab today including iron

## 2019-06-15 NOTE — Assessment & Plan Note (Signed)

## 2019-06-15 NOTE — Progress Notes (Signed)
Subjective:    Patient ID: Vanessa Clayton, female    DOB: [birth year], 85 y.o.   MRN: [id number]  HPI  Here for wellness and f/u with daughter;  Overall doing ok;  Pt denies Chest pain, worsening SOB, DOE, wheezing, orthopnea, PND, worsening LE edema, palpitations, dizziness or syncope.  Pt denies neurological change such as new headache, facial or extremity weakness. . Pt states overall good compliance with treatment and medications, good tolerability, and has been trying to follow appropriate diet.  Pt denies worsening depressive symptoms, suicidal ideation or panic. No fever, night sweats, wt loss, loss of appetite, or other constitutional symptoms.  Pt states good ability with ADL's, has low fall risk, home safety reviewed and adequate, no other significant changes in hearing or vision, and only occasionally active with exercise. Also, Iron caused constipation., no overt bleeding, last Hgb 9.9 about 6 mo ago, Denies worsening reflux, abd pain, dysphagia, n/v, bowel change or blood.  Denies hyper or hypo thyroid symptoms such as voice, skin or hair change.   Pt denies polydipsia, polyuria, or low sugar symptoms such as weakness or confusion improved with po intake.  Pt states overall good compliance with meds, trying to follow lower cholesterol, diabetic diet, wt overall stable but little exercise however.    Past Medical History:  Diagnosis Date  . Allergy   . Breast cancer ([place])    left  . Chronic anemia   . Chronic anxiety   . History of TIA (transient ischemic attack) 2017, 2020  . HTN (hypertension)   . Hyperthyroidism   . Hypothyroidism    following treatment for hyperthyroidism  . Syncope and collapse   . Vitamin D deficiency    Past Surgical History:  Procedure Laterality Date  . BREAST LUMPECTOMY  1998   left with radiation  . CHOLECYSTECTOMY    . COLON SURGERY    . KNEE ARTHROSCOPY  10/15/05  . TONSILLECTOMY      reports that she has never smoked. She has never used  smokeless tobacco. She reports that she does not drink alcohol or use drugs. family history includes Hypertension in her brother and mother. Allergies  Allergen Reactions  . Penicillins Other (See Comments) and Itching    Rash    Current Outpatient Medications on File Prior to Visit  Medication Sig Dispense Refill  . acetaminophen (TYLENOL) 500 MG tablet Take 500 mg by mouth every 6 (six) hours as needed for moderate pain.     Marland Kitchen atenolol (TENORMIN) 50 MG tablet TAKE 1/2 TABLET BY MOUTH DAILY (Patient taking differently: Take 25 mg by mouth daily. (BETA BLOCKER)) 45 tablet 3  . docusate sodium (COLACE) 100 MG capsule Take 100 mg by mouth daily as needed for mild constipation.    . Ferrous Sulfate (SLOW FE PO) Take 1 tablet by mouth daily.     . Vitamin D, Ergocalciferol, (DRISDOL) 50000 UNITS CAPS Take 50,000 Units by mouth every 14 (fourteen) days.      No current facility-administered medications on file prior to visit.    Review of Systems  Constitutional: Negative for other unusual diaphoresis or sweats HENT: Negative for ear discharge or swelling Eyes: Negative for other worsening visual disturbances Respiratory: Negative for stridor or other swelling  Gastrointestinal: Negative for worsening distension or other blood Genitourinary: Negative for retention or other urinary change Musculoskeletal: Negative for other MSK pain or swelling Skin: Negative for color change or other new lesions Neurological: Negative for worsening tremors and other  numbness  Psychiatric/Behavioral: Negative for worsening agitation or other fatigue All otherwise neg per pt      Objective:   Physical Exam BP 118/72   Pulse 68   Temp 98 F (36.7 C) (Oral)   Ht 5\' 5"  (1.651 m)   Wt 117 lb (53.1 kg)   SpO2 96%   BMI 19.47 kg/m  VS noted, with bilat hearing loss apparent Constitutional: Pt appears in NAD HENT: Head: NCAT.  Right Ear: External ear normal.  Left Ear: External ear normal.  Eyes: .  Pupils are equal, round, and reactive to light. Conjunctivae and EOM are normal Nose: without d/c or deformity Neck: Neck supple. Gross normal ROM Cardiovascular: Normal rate and regular rhythm.   Pulmonary/Chest: Effort normal and breath sounds without rales or wheezing.  Abd:  Soft, NT, ND, + BS, no organomegaly Neurological: Pt is alert. At baseline orientation, motor grossly intact Skin: Skin is warm. No rashes, other new lesions, no LE edema Psychiatric: Pt behavior is normal without agitation  No other exam findings  Lab Results  Component Value Date   WBC 7.1 10/26/2018   HGB 9.9 (L) 10/26/2018   HCT 32.8 (L) 10/26/2018   PLT 204 10/26/2018   GLUCOSE 108 (H) 10/26/2018   CHOL 174 10/27/2018   TRIG 43 10/27/2018   HDL 49 10/27/2018   LDLCALC 116 (H) 10/27/2018   ALT 10 10/26/2018   AST 18 10/26/2018   NA 136 10/26/2018   K 4.2 10/26/2018   CL 103 10/26/2018   CREATININE 1.05 (H) 10/26/2018   BUN 22 10/26/2018   CO2 21 (L) 10/26/2018   TSH 2.448 03/19/2015   INR 1.0 10/26/2018   HGBA1C 5.5 10/27/2018      Assessment & Plan:

## 2019-06-15 NOTE — Assessment & Plan Note (Addendum)
For audiology referral, needs hearing aids  In addition to the time spent performing CPE, I spent an additional 25 minutes face to face,in which greater than 50% of this time was spent in counseling and coordination of care for patient's acute illness as documented, including the differential dx, treatment, further evaluation and other management of  Bilateral hearing loss, anemia, HTN, HLD, hyperglycemia, hypothyroidism

## 2019-06-15 NOTE — Patient Instructions (Addendum)
You had the flu shot today  Please take all new medication as prescribed - the prenatal vitamin with Iron  Please continue all other medications as before, and refills have been done if requested.  Please have the pharmacy call with any other refills you may need.  You will be contacted regarding the referral for: Audiology for hearing loss  Please continue your efforts at being more active, low cholesterol diet, and weight control.  You are otherwise up to date with prevention measures today.  Please keep your appointments with your specialists as you may have planned  Please go to the LAB in the Basement (turn left off the elevator) for the tests to be done today  You will be contacted by phone if any changes need to be made immediately.  Otherwise, you will receive a letter about your results with an explanation, but please check with MyChart first.  Please remember to sign up for MyChart if you have not done so, as this will be important to you in the future with finding out test results, communicating by private email, and scheduling acute appointments online when needed.  Please return in 6 months, or sooner if needed

## 2019-06-16 LAB — URINALYSIS, ROUTINE W REFLEX MICROSCOPIC
Bilirubin Urine: NEGATIVE
Hgb urine dipstick: NEGATIVE
Ketones, ur: NEGATIVE
Leukocytes,Ua: NEGATIVE
Nitrite: NEGATIVE
RBC / HPF: NONE SEEN (ref 0–?)
Specific Gravity, Urine: 1.01 (ref 1.000–1.030)
Total Protein, Urine: NEGATIVE
Urine Glucose: NEGATIVE
Urobilinogen, UA: 0.2 (ref 0.0–1.0)
pH: 6 (ref 5.0–8.0)

## 2019-06-16 LAB — HEMOGLOBIN A1C: Hgb A1c MFr Bld: 5.7 % (ref 4.6–6.5)

## 2019-11-08 ENCOUNTER — Other Ambulatory Visit: Payer: Self-pay | Admitting: Interventional Cardiology

## 2019-12-05 ENCOUNTER — Other Ambulatory Visit: Payer: Self-pay | Admitting: Interventional Cardiology

## 2019-12-14 ENCOUNTER — Ambulatory Visit: Payer: Medicare Other | Admitting: Internal Medicine

## 2020-01-10 ENCOUNTER — Encounter: Payer: Self-pay | Admitting: Internal Medicine

## 2020-01-10 ENCOUNTER — Ambulatory Visit (INDEPENDENT_AMBULATORY_CARE_PROVIDER_SITE_OTHER): Payer: Medicare PPO

## 2020-01-10 ENCOUNTER — Other Ambulatory Visit: Payer: Self-pay

## 2020-01-10 ENCOUNTER — Ambulatory Visit: Payer: Medicare PPO | Admitting: Internal Medicine

## 2020-01-10 VITALS — BP 130/70 | HR 74 | Temp 98.0°F | Ht 65.0 in | Wt 105.0 lb

## 2020-01-10 DIAGNOSIS — R634 Abnormal weight loss: Secondary | ICD-10-CM | POA: Diagnosis not present

## 2020-01-10 DIAGNOSIS — K5900 Constipation, unspecified: Secondary | ICD-10-CM

## 2020-01-10 DIAGNOSIS — R739 Hyperglycemia, unspecified: Secondary | ICD-10-CM | POA: Diagnosis not present

## 2020-01-10 DIAGNOSIS — Z0000 Encounter for general adult medical examination without abnormal findings: Secondary | ICD-10-CM

## 2020-01-10 DIAGNOSIS — Z0001 Encounter for general adult medical examination with abnormal findings: Secondary | ICD-10-CM

## 2020-01-10 HISTORY — DX: Constipation, unspecified: K59.00

## 2020-01-10 LAB — CBC WITH DIFFERENTIAL/PLATELET
Basophils Absolute: 0 10*3/uL (ref 0.0–0.1)
Basophils Relative: 0.3 % (ref 0.0–3.0)
Eosinophils Absolute: 0.1 10*3/uL (ref 0.0–0.7)
Eosinophils Relative: 1.4 % (ref 0.0–5.0)
HCT: 29.6 % — ABNORMAL LOW (ref 36.0–46.0)
Hemoglobin: 9.1 g/dL — ABNORMAL LOW (ref 12.0–15.0)
Lymphocytes Relative: 37.7 % (ref 12.0–46.0)
Lymphs Abs: 2.2 10*3/uL (ref 0.7–4.0)
MCHC: 30.9 g/dL (ref 30.0–36.0)
MCV: 79.3 fl (ref 78.0–100.0)
Monocytes Absolute: 0.7 10*3/uL (ref 0.1–1.0)
Monocytes Relative: 11.2 % (ref 3.0–12.0)
Neutro Abs: 2.9 10*3/uL (ref 1.4–7.7)
Neutrophils Relative %: 49.4 % (ref 43.0–77.0)
Platelets: 225 10*3/uL (ref 150.0–400.0)
RBC: 3.73 Mil/uL — ABNORMAL LOW (ref 3.87–5.11)
RDW: 13.3 % (ref 11.5–15.5)
WBC: 5.9 10*3/uL (ref 4.0–10.5)

## 2020-01-10 LAB — BASIC METABOLIC PANEL
BUN: 48 mg/dL — ABNORMAL HIGH (ref 6–23)
CO2: 25 mEq/L (ref 19–32)
Calcium: 9.8 mg/dL (ref 8.4–10.5)
Chloride: 105 mEq/L (ref 96–112)
Creatinine, Ser: 1.23 mg/dL — ABNORMAL HIGH (ref 0.40–1.20)
GFR: 49.46 mL/min — ABNORMAL LOW (ref >60.00)
Glucose, Bld: 108 mg/dL — ABNORMAL HIGH (ref 70–99)
Potassium: 5.3 mEq/L — ABNORMAL HIGH (ref 3.5–5.1)
Sodium: 136 mEq/L (ref 135–145)

## 2020-01-10 LAB — HEPATIC FUNCTION PANEL
ALT: 5 U/L (ref 0–35)
AST: 13 U/L (ref 0–37)
Albumin: 4 g/dL (ref 3.5–5.2)
Alkaline Phosphatase: 50 U/L (ref 39–117)
Bilirubin, Direct: 0.1 mg/dL (ref 0.0–0.3)
Total Bilirubin: 0.3 mg/dL (ref 0.2–1.2)
Total Protein: 7.6 g/dL (ref 6.0–8.3)

## 2020-01-10 LAB — LIPID PANEL
Cholesterol: 128 mg/dL (ref 0–200)
HDL: 45.9 mg/dL (ref >39.00)
LDL Cholesterol: 68 mg/dL (ref 0–99)
NonHDL: 82.26
Total CHOL/HDL Ratio: 3
Triglycerides: 73 mg/dL (ref 0.0–149.0)
VLDL: 14.6 mg/dL (ref 0.0–40.0)

## 2020-01-10 LAB — TSH: TSH: 0.49 u[IU]/mL (ref 0.35–4.50)

## 2020-01-10 NOTE — Patient Instructions (Signed)
Ok for the Boost twice per day  [facility] for the Miralax powder once per day  Please continue all other medications as before, and refills have been done if requested.  Please have the pharmacy call with any other refills you may need.  Please continue your efforts at being more active, low cholesterol diet, and weight control.  You are otherwise up to date with prevention measures today.  Please keep your appointments with your specialists as you may have planned  Please go to the LAB at the blood drawing area for the tests to be done  You will be contacted by phone if any changes need to be made immediately.  Otherwise, you will receive a letter about your results with an explanation, but please check with MyChart first.  Please remember to sign up for MyChart if you have not done so, as this will be important to you in the future with finding out test results, communicating by private email, and scheduling acute appointments online when needed.  Please make an Appointment to return in 6 months, or sooner if needed

## 2020-01-10 NOTE — Assessment & Plan Note (Signed)

## 2020-01-10 NOTE — Progress Notes (Signed)
Subjective:   Vanessa Clayton is a 86 y.o. female who presents for Medicare Annual (Subsequent) preventive examination.  Review of Systems:  No ROS. Medicare Wellness Visit. Additional risk factors are reflected in social history. Cardiac Risk Factors include: advanced age (>39men, >31 women);dyslipidemia  Sleep Patterns: No sleep issues, feels rested on waking and sleeps 8 hours nightly. Home Safety/Smoke Alarms: Feels safe in home; uses home alarm. Smoke alarms in place. Living environment: 2-story home. Lives alone; uses a cane and walker at home; good family support system. Seat Belt Safety/Bike Helmet: Wears seat belt.     Objective:     Vitals: BP 130/70 (BP Location: Left Arm, Patient Position: Sitting)   Pulse 74   Temp 98 F (36.7 C)   Ht 5\' 5"  (1.651 m)   Wt 105 lb (47.6 kg)   SpO2 97%   BMI 17.47 kg/m   Body mass index is 17.47 kg/m.  Advanced Directives 01/10/2020 10/26/2018 05/07/2015 03/19/2015  Does Patient Have a Medical Advance Directive? Yes No No No  Type of Advance Directive [place]  Does patient want to make changes to medical advance directive? No - Patient declined - - -  Copy of Hope in Chart? No - copy requested - - -  Would patient like information on creating a medical advance directive? - No - Patient declined No - patient declined information -    Tobacco Social History   Tobacco Use  Smoking Status Never Smoker  Smokeless Tobacco Never Used     Counseling given: No   Clinical Intake:  Pre-visit preparation completed: Yes  Pain : No/denies pain Pain Score: 0-No pain     BMI - recorded: 17.5 Nutritional Status: BMI <19  Underweight Nutritional Risks: Unintentional weight loss, None(appetite not so good) Diabetes: No  How often do you need to have someone help you when you read instructions, pamphlets, or other written materials from your doctor or pharmacy?: 1 -  Never  Interpreter Needed?: No  Information entered by :: Mena Lienau N. Lowell Guitar, LPN  Past Medical History:  Diagnosis Date  . Allergy   . Breast cancer ([place])    left  . Chronic anemia   . Chronic anxiety   . History of TIA (transient ischemic attack) 2017, 2020  . HTN (hypertension)   . Hyperthyroidism   . Hypothyroidism    following treatment for hyperthyroidism  . Syncope and collapse   . Vitamin D deficiency    Past Surgical History:  Procedure Laterality Date  . BREAST LUMPECTOMY  1998   left with radiation  . CHOLECYSTECTOMY    . COLON SURGERY    . KNEE ARTHROSCOPY  10/15/05  . TONSILLECTOMY     Family History  Problem Relation Age of Onset  . Hypertension Mother   . Hypertension Brother   . Heart attack Neg Hx   . Stroke Neg Hx    Social History   Socioeconomic History  . Marital status: Married    Spouse name: Not on file  . Number of children: 1  . Years of education: 59  . Highest education level: Not on file  Occupational History  . Not on file  Tobacco Use  . Smoking status: Never Smoker  . Smokeless tobacco: Never Used  Substance and Sexual Activity  . Alcohol use: No  . Drug use: No  . Sexual activity: Not on file  Other Topics Concern  . Not  on file  Social History Narrative   Fun: Travel    12/13/2018 lives alone, grandchildren go daily and helps her   Denies abuse and feels safe at home.    Social Determinants of Health   Financial Resource Strain:   . Difficulty of Paying Living Expenses:   Food Insecurity:   . Worried About Charity fundraiser in the Last Year:   . Arboriculturist in the Last Year:   Transportation Needs:   . Film/video editor (Medical):   Marland Kitchen Lack of Transportation (Non-Medical):   Physical Activity:   . Days of Exercise per Week:   . Minutes of Exercise per Session:   Stress:   . Feeling of Stress :   Social Connections:   . Frequency of Communication with Friends and Family:   . Frequency of Social  Gatherings with Friends and Family:   . Attends Religious Services:   . Active Member of Clubs or Organizations:   . Attends Archivist Meetings:   Marland Kitchen Marital Status:     Outpatient Encounter Medications as of 01/10/2020  Medication Sig  . acetaminophen (TYLENOL) 500 MG tablet Take 500 mg by mouth every 6 (six) hours as needed for moderate pain.   Marland Kitchen amLODipine (NORVASC) 5 MG tablet Take 1 tablet (5 mg total) by mouth daily.  Marland Kitchen atenolol (TENORMIN) 50 MG tablet Take 1 tablet (50 mg total) by mouth daily. PLEASE SCHEDULE AN APPT FOR FURTHER REFILLS 2ND ATTEMPT [phone]  . clopidogrel (PLAVIX) 75 MG tablet TAKE 1 TABLET(75 MG) BY MOUTH DAILY  . escitalopram (LEXAPRO) 10 MG tablet Take 1 tablet (10 mg total) by mouth daily.  . Ferrous Sulfate (SLOW FE PO) Take 1 tablet by mouth daily.   Marland Kitchen levothyroxine (SYNTHROID) 75 MCG tablet Take 1 tablet (75 mcg total) by mouth daily before breakfast.  . pravastatin (PRAVACHOL) 40 MG tablet Take 1 tablet (40 mg total) by mouth daily.  . Vitamin D, Ergocalciferol, (DRISDOL) 50000 UNITS CAPS Take 50,000 Units by mouth every 14 (fourteen) days.    No facility-administered encounter medications on file as of 01/10/2020.    Activities of Daily Living In your present state of health, do you have any difficulty performing the following activities: 01/10/2020  Hearing? Y  Comment has hearing aids  Vision? N  Difficulty concentrating or making decisions? N  Walking or climbing stairs? N  Dressing or bathing? N  Doing errands, shopping? N  Preparing Food and eating ? N  Using the Toilet? N  In the past six months, have you accidently leaked urine? Y  Do you have problems with loss of bowel control? N  Managing your Medications? N  Managing your Finances? N  Housekeeping or managing your Housekeeping? N  Some recent data might be hidden    Patient Care Team: Biagio Borg, MD as PCP - General (Internal Medicine)    Assessment:   This is a  routine wellness examination for [place].  Exercise Activities and Dietary recommendations Current Exercise Habits: The patient does not participate in regular exercise at present, Exercise limited by: cardiac condition(s);neurologic condition(s);orthopedic condition(s)  Goals   None     Fall Risk Fall Risk  01/10/2020 01/14/2018 03/12/2016 12/21/2015 07/16/2015  Falls in the past year? 1 No Yes No No  Number falls in past yr: 0 - 1 - -  Injury with Fall? 0 - No - -  Risk for fall due to : Impaired balance/gait - - - -  Follow up Falls evaluation completed;Education provided;Falls prevention discussed - - - -   Is the patient's home free of loose throw rugs in walkways, pet beds, electrical cords, etc?   yes      Grab bars in the bathroom? yes      Handrails on the stairs?   yes      Adequate lighting?   yes    Depression Screen PHQ 2/9 Scores 01/10/2020 01/14/2018 03/12/2016  PHQ - 2 Score 0 0 0  PHQ- 9 Score - 0 -            Immunization History  Administered Date(s) Administered  . Fluad Quad(high Dose 65+) 06/15/2019  . Influenza, High Dose Seasonal PF 07/19/2018  . Influenza-Unspecified 06/30/2013, 07/26/2014, 07/03/2015, 05/02/2016, 07/13/2017  . Pneumococcal Conjugate-13 03/12/2016  . Tdap 03/12/2016    Qualifies for Shingles Vaccine? Yes, will check with local pharmacy  Screening Tests Health Maintenance  Topic Date Due  . COVID-19 Vaccine (1) Never done  . INFLUENZA VACCINE  04/01/2020  . TETANUS/TDAP  03/12/2026  . PNA vac Low Risk Adult  Completed  . DEXA SCAN  Discontinued    Cancer Screenings: Lung: Low Dose CT Chest recommended if Age 55-80 years, 30 pack-year currently smoking OR have quit w/in 15years. Patient does not qualify. Breast:  Up to date on Mammogram? No; not recommended due to age. Up to date of Bone Density/Dexa? No; not recommended due to age. Colorectal: No; not recommended due to age.     Plan:     Reviewed health maintenance  screenings with patient today and relevant education, vaccines, and/or referrals were provided.    Continue doing brain stimulating activities (puzzles, reading, adult coloring books, staying active) to keep memory sharp.    Continue to eat heart healthy diet (full of fruits, vegetables, whole grains, lean protein, water--limit salt, fat, and sugar intake) and increase physical activity as tolerated.   I have personally reviewed and noted the following in the patient's chart:   . Medical and social history . Use of alcohol, tobacco or illicit drugs  . Current medications and supplements . Functional ability and status . Nutritional status . Physical activity . Advanced directives . List of other physicians . Hospitalizations, surgeries, and ER visits in previous 12 months . Vitals . Screenings to include cognitive, depression, and falls . Referrals and appointments  In addition, I have reviewed and discussed with patient certain preventive protocols, quality metrics, and best practice recommendations. A written personalized care plan for preventive services as well as general preventive health recommendations were provided to patient.     Sheral Flow, LPN  [id number] Nurse Health Advisor

## 2020-01-10 NOTE — Assessment & Plan Note (Signed)
stable overall by history and exam, recent data reviewed with pt, and pt to continue medical treatment as before,  to f/u any worsening symptoms or concerns  

## 2020-01-10 NOTE — Assessment & Plan Note (Signed)
Ok for daily miralax prn

## 2020-01-10 NOTE — Assessment & Plan Note (Addendum)
Etiology unclear, for labs and cxr as ordered, boost 2 cans per day  I spent 31 minutes in addition to time for CPX wellness examination in preparing to see the patient by review of recent labs, imaging and procedures, obtaining and reviewing separately obtained history, communicating with the patient and family or caregiver, ordering medications, tests or procedures, and documenting clinical information in the EHR including the differential Dx, treatment, and any further evaluation and other management of wt loss, constipation, and hyperglycemia

## 2020-01-10 NOTE — Progress Notes (Signed)
Subjective:    Patient ID: Vanessa Clayton, female    DOB: 06-Jan-1926, 86 y.o.   MRN: [id number]  HPI  Here for wellness and f/u;  Overall doing ok;  Pt denies Chest pain, worsening SOB, DOE, wheezing, orthopnea, PND, worsening LE edema, palpitations, dizziness or syncope.  Pt denies neurological change such as new headache, facial or extremity weakness.  Pt denies polydipsia, polyuria, or low sugar symptoms. Pt states overall good compliance with treatment and medications, good tolerability, and has been trying to follow appropriate diet.  Pt denies worsening depressive symptoms, suicidal ideation or panic. No fever, night sweats, or other constitutional symptoms except for recent wt loss and lower appetite for unclear reasons.  Pt states good ability with ADL's, has low fall risk, home safety reviewed and adequate, no other significant changes in hearing or vision, and only occasionally active with exercise. Wt Readings from Last 3 Encounters:  01/10/20 105 lb (47.6 kg)  06/15/19 117 lb (53.1 kg)  11/04/18 118 lb (53.5 kg)   Past Medical History:  Diagnosis Date  . Allergy   . Breast cancer ([place])    left  . Chronic anemia   . Chronic anxiety   . History of TIA (transient ischemic attack) 2017, 2020  . HTN (hypertension)   . Hyperthyroidism   . Hypothyroidism    following treatment for hyperthyroidism  . Syncope and collapse   . Vitamin D deficiency    Past Surgical History:  Procedure Laterality Date  . BREAST LUMPECTOMY  1998   left with radiation  . CHOLECYSTECTOMY    . COLON SURGERY    . KNEE ARTHROSCOPY  10/15/05  . TONSILLECTOMY      reports that she has never smoked. She has never used smokeless tobacco. She reports that she does not drink alcohol or use drugs. family history includes Hypertension in her brother and mother. Allergies  Allergen Reactions  . Penicillins Other (See Comments) and Itching    Rash    Current Outpatient Medications on File Prior to Visit   Medication Sig Dispense Refill  . acetaminophen (TYLENOL) 500 MG tablet Take 500 mg by mouth every 6 (six) hours as needed for moderate pain.     Marland Kitchen amLODipine (NORVASC) 5 MG tablet Take 1 tablet (5 mg total) by mouth daily. 90 tablet 3  . atenolol (TENORMIN) 50 MG tablet Take 1 tablet (50 mg total) by mouth daily. PLEASE SCHEDULE AN APPT FOR FURTHER REFILLS 2ND ATTEMPT [phone] 8 tablet 0  . clopidogrel (PLAVIX) 75 MG tablet TAKE 1 TABLET(75 MG) BY MOUTH DAILY 90 tablet 3  . escitalopram (LEXAPRO) 10 MG tablet Take 1 tablet (10 mg total) by mouth daily. 90 tablet 3  . Ferrous Sulfate (SLOW FE PO) Take 1 tablet by mouth daily.     Marland Kitchen levothyroxine (SYNTHROID) 75 MCG tablet Take 1 tablet (75 mcg total) by mouth daily before breakfast. 90 tablet 3  . polyethylene glycol powder (GLYCOLAX/MIRALAX) 17 GM/SCOOP powder Take 1 dose once a day as needed for constipation    . pravastatin (PRAVACHOL) 40 MG tablet Take 1 tablet (40 mg total) by mouth daily. 90 tablet 3  . Vitamin D, Ergocalciferol, (DRISDOL) 50000 UNITS CAPS Take 50,000 Units by mouth every 14 (fourteen) days.      No current facility-administered medications on file prior to visit.   Review of Systems All otherwise neg per pt     Objective:   Physical Exam BP 130/70 (BP Location: Left Arm,  Patient Position: Sitting, Cuff Size: Large)   Pulse 74   Temp 98 F (36.7 C) (Oral)   Ht 5\' 5"  (1.651 m)   Wt 105 lb (47.6 kg)   SpO2 97%   BMI 17.47 kg/m  VS noted,  Constitutional: Pt appears in NAD HENT: Head: NCAT.  Right Ear: External ear normal.  Left Ear: External ear normal.  Eyes: . Pupils are equal, round, and reactive to light. Conjunctivae and EOM are normal Nose: without d/c or deformity Neck: Neck supple. Gross normal ROM Cardiovascular: Normal rate and regular rhythm.   Pulmonary/Chest: Effort normal and breath sounds without rales or wheezing.  Abd:  Soft, NT, ND, + BS, no organomegaly Neurological: Pt is alert. At  baseline orientation, motor grossly intact Skin: Skin is warm. No rashes, other new lesions, no LE edema Psychiatric: Pt behavior is normal without agitation  All otherwise neg per pt Lab Results  Component Value Date   WBC 7.0 06/15/2019   HGB 9.6 (L) 06/15/2019   HCT 31.2 (L) 06/15/2019   PLT 220.0 06/15/2019   GLUCOSE 97 06/15/2019   CHOL 139 06/15/2019   TRIG 136.0 06/15/2019   HDL 48.10 06/15/2019   LDLCALC 64 06/15/2019   ALT 6 06/15/2019   AST 14 06/15/2019   NA 135 06/15/2019   K 4.9 06/15/2019   CL 104 06/15/2019   CREATININE 1.25 (H) 06/15/2019   BUN 36 (H) 06/15/2019   CO2 24 06/15/2019   TSH 1.13 06/15/2019   INR 1.0 10/26/2018   HGBA1C 5.7 06/15/2019      Assessment & Plan:

## 2020-01-11 LAB — HEMOGLOBIN A1C: Hgb A1c MFr Bld: 5.8 % (ref 4.6–6.5)

## 2020-01-17 ENCOUNTER — Telehealth: Payer: Self-pay | Admitting: Interventional Cardiology

## 2020-01-17 NOTE — Telephone Encounter (Signed)
Vanessa Clayton is calling requesting her granddaughter attend her upcoming appointment scheduled for 01/19/20 at 1:20 PM due to her not walking well. Please advise.

## 2020-01-17 NOTE — Telephone Encounter (Signed)
Called and spoke to patient. Made her aware that her granddaughter can come up with her to her appointment since she has trouble getting around.

## 2020-01-19 ENCOUNTER — Encounter: Payer: Self-pay | Admitting: Interventional Cardiology

## 2020-01-19 ENCOUNTER — Ambulatory Visit: Payer: Medicare PPO | Admitting: Interventional Cardiology

## 2020-01-19 ENCOUNTER — Other Ambulatory Visit: Payer: Self-pay

## 2020-01-19 VITALS — BP 110/60 | HR 108 | Ht 65.0 in | Wt 105.8 lb

## 2020-01-19 DIAGNOSIS — E782 Mixed hyperlipidemia: Secondary | ICD-10-CM | POA: Diagnosis not present

## 2020-01-19 DIAGNOSIS — Z8673 Personal history of transient ischemic attack (TIA), and cerebral infarction without residual deficits: Secondary | ICD-10-CM

## 2020-01-19 DIAGNOSIS — I119 Hypertensive heart disease without heart failure: Secondary | ICD-10-CM

## 2020-01-19 DIAGNOSIS — I5189 Other ill-defined heart diseases: Secondary | ICD-10-CM

## 2020-01-19 MED ORDER — ATENOLOL 25 MG PO TABS: 25.0000 mg | ORAL_TABLET | Freq: Every day | ORAL | 3 refills | Status: DC

## 2020-01-19 NOTE — Progress Notes (Signed)
Cardiology Office Note   Date:  01/19/2020   ID:  Vanessa Clayton, Vanessa Clayton 09-01-1926, MRN [id number]  PCP:  Biagio Borg, MD    No chief complaint on file.  HTN  Wt Readings from Last 3 Encounters:  01/19/20 105 lb 12.8 oz (48 kg)  01/10/20 105 lb (47.6 kg)  01/10/20 105 lb (47.6 kg)       History of Present Illness: Vanessa Clayton is a 86 y.o. female  saw Dr. Mare Ferrari in the past. She has a past history of essential hypertension and history of hypercholesterolemia. She has a remote history of thyrotoxicosis. She's also had a past history of breast cancer of the left breast.  She had an echocardiogram in 07/04/13 showing an ejection fraction of 55-60% with grade 1 diastolic dysfunction. Her recent lab work has been stable.  She was hospitalized on 05/07/15 for possible TIA. An echocardiogram on 05/08/15 showed moderate LVH and an ejection fraction of [id number] and no embolic source. She also had normal carotid ultrasound and a normal EEG. In the hospital she was started on statin in the form of pravastatin 20 mg daily.  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  He passed away in 2017/11/09.   Since turning 89 in 10/19, her BP has been high.  Systolics consistently above 150 mm Hg.  She was seen in the PharmD HTN clinic.  Since the last visit, she has lost some weight.   She has been out of atenolol for a week.     Past Medical History:  Diagnosis Date  . Allergy   . Anemia 07/19/2018  . Asymptomatic PVCs 12/09/2012  . Benign hypertensive heart disease without heart failure 12/04/2010  . Bilateral hearing loss 06/15/2019  . Breast cancer ([place])    left  . Chronic anemia   . Chronic anxiety   . Constipation 01/10/2020  . Depression 12/04/2010  . Diastolic dysfunction [id number]  . Dyspnea on exertion 06/17/2013  . Essential hypertension 12/21/2015  . History of TIA (transient ischemic attack) 2017, 2020  . HLD (hyperlipidemia) 12/21/2015  . HTN (hypertension)   .  Hyperglycemia 06/15/2019  . Hyperthyroidism   . Hypothyroidism    following treatment for hyperthyroidism  . Medicare annual wellness visit, subsequent 03/12/2016  . Orthostatic hypotension 03/26/2015  . Peripheral neuropathy 07/19/2018  . Protein-calorie malnutrition, severe ([place]) 05/09/2015  . Sinus tachycardia 04/09/2015  . Syncope and collapse   . TIA (transient ischemic attack) 05/07/2015  . Vitamin D deficiency   . Weight loss, non-intentional 12/09/2012    Past Surgical History:  Procedure Laterality Date  . BREAST LUMPECTOMY  1998   left with radiation  . CHOLECYSTECTOMY    . COLON SURGERY    . KNEE ARTHROSCOPY  10/15/05  . TONSILLECTOMY       Current Outpatient Medications  Medication Sig Dispense Refill  . acetaminophen (TYLENOL) 500 MG tablet Take 500 mg by mouth every 6 (six) hours as needed for moderate pain.     Marland Kitchen amLODipine (NORVASC) 5 MG tablet Take 1 tablet (5 mg total) by mouth daily. 90 tablet 3  . atenolol (TENORMIN) 50 MG tablet Take 1 tablet (50 mg total) by mouth daily. PLEASE SCHEDULE AN APPT FOR FURTHER REFILLS 2ND ATTEMPT [phone] 8 tablet 0  . clopidogrel (PLAVIX) 75 MG tablet TAKE 1 TABLET(75 MG) BY MOUTH DAILY 90 tablet 3  . escitalopram (LEXAPRO) 10 MG tablet Take 1 tablet (10 mg total) by mouth daily.  90 tablet 3  . Ferrous Sulfate (SLOW FE PO) Take 1 tablet by mouth daily.     Marland Kitchen levothyroxine (SYNTHROID) 75 MCG tablet Take 1 tablet (75 mcg total) by mouth daily before breakfast. 90 tablet 3  . polyethylene glycol powder (GLYCOLAX/MIRALAX) 17 GM/SCOOP powder Take 1 dose once a day as needed for constipation    . pravastatin (PRAVACHOL) 40 MG tablet Take 1 tablet (40 mg total) by mouth daily. 90 tablet 3  . Vitamin D, Ergocalciferol, (DRISDOL) 50000 UNITS CAPS Take 50,000 Units by mouth every 14 (fourteen) days.      No current facility-administered medications for this visit.    Allergies:   Penicillins    Social History:  The patient  reports  that she has never smoked. She has never used smokeless tobacco. She reports that she does not drink alcohol or use drugs.   Family History:  The patient's family history includes Hypertension in her brother and mother.    ROS:  Please see the history of present illness.   Otherwise, review of systems are positive for losing weight.   All other systems are reviewed and negative.    PHYSICAL EXAM: VS:  BP 110/60   Pulse (!) 108   Ht 5\' 5"  (1.651 m)   Wt 105 lb 12.8 oz (48 kg)   SpO2 98%   BMI 17.61 kg/m  , BMI Body mass index is 17.61 kg/m. GEN: Well nourished, well developed, in no acute distress  HEENT: normal  Neck: no JVD, carotid bruits, or masses Cardiac: RRR; no murmurs, rubs, or gallops,no edema  Respiratory:  clear to auscultation bilaterally, normal work of breathing GI: soft, nontender, nondistended, + BS MS: no deformity or atrophy  Skin: warm and dry, no rash Neuro:  Strength and sensation are intact Psych: euthymic mood, full affect   EKG:   The ekg ordered today demonstrates sinus tach, no ST changes   Recent Labs: 01/10/2020: ALT 5; BUN 48; Creatinine, Ser 1.23; Hemoglobin 9.1; Platelets 225.0; Potassium 5.3; Sodium 136; TSH 0.49   Lipid Panel    Component Value Date/Time   CHOL 128 01/10/2020 1230   TRIG 73.0 01/10/2020 1230   HDL 45.90 01/10/2020 1230   CHOLHDL 3 01/10/2020 1230   VLDL 14.6 01/10/2020 1230   LDLCALC 68 01/10/2020 1230     Other studies Reviewed: Additional studies/ records that were reviewed today with results demonstrating: 5/21 labs reviewed.     ASSESSMENT AND PLAN:  1. Hypertensive heart disease: The current medical regimen is effective;  continue present plan and medications. 2. Hyperlipidemia: LDL 68.  Continue pravastatin.  3. Diastolic dysfunction: Appears euvolemic.  4. Prior TIA: Functioning independently.  5. Anemia: Hbg 9.1 in 5/21.  Taking iron supplement.    Current medicines are reviewed at length with the  patient today.  The patient concerns regarding her medicines were addressed.  The following changes have been made:  No change  Labs/ tests ordered today include:  No orders of the defined types were placed in this encounter.   Recommend 150 minutes/week of aerobic exercise Low fat, low carb, high fiber diet recommended  Disposition:   FU in 1 year   Signed, Larae Grooms, MD  01/19/2020 1:39 PM    [facility] [address], [place], [place]  [address] Phone: [phone]; Fax: [phone]

## 2020-01-19 NOTE — Patient Instructions (Signed)
Medication Instructions:  Your physician recommends that you continue on your current medications as directed. Please refer to the Current Medication list given to you today.  Continue taking atenolol 25 mg once daily as you have been. We have updated your prescription.  *If you need a refill on your cardiac medications before your next appointment, please call your pharmacy*   Lab Work: None ordered  If you have labs (blood work) drawn today and your tests are completely normal, you will receive your results only by: Marland Kitchen MyChart Message (if you have MyChart) OR . A paper copy in the mail If you have any lab test that is abnormal or we need to change your treatment, we will call you to review the results.   Testing/Procedures: None ordered   Follow-Up: At [facility], you and your health needs are our priority.  As part of our continuing mission to provide you with exceptional heart care, we have created designated Provider Care Teams.  These Care Teams include your primary Cardiologist (physician) and Advanced Practice Providers (APPs -  Physician Assistants and Nurse Practitioners) who all work together to provide you with the care you need, when you need it.  We recommend signing up for the patient portal called "MyChart".  Sign up information is provided on this After Visit Summary.  MyChart is used to connect with patients for Virtual Visits (Telemedicine).  Patients are able to view lab/test results, encounter notes, upcoming appointments, etc.  Non-urgent messages can be sent to your provider as well.   To learn more about what you can do with MyChart, go to [email or web address].    Your next appointment:   12 month(s)  The format for your next appointment:   In Person  Provider:   You may see Larae Grooms, MD or one of the following Advanced Practice Providers on your designated Care Team:    Melina Copa, PA-C  Ermalinda Barrios, PA-C    Other  Instructions None

## 2020-04-06 DIAGNOSIS — R7301 Impaired fasting glucose: Secondary | ICD-10-CM | POA: Diagnosis not present

## 2020-04-06 DIAGNOSIS — I10 Essential (primary) hypertension: Secondary | ICD-10-CM | POA: Diagnosis not present

## 2020-04-06 DIAGNOSIS — E039 Hypothyroidism, unspecified: Secondary | ICD-10-CM | POA: Diagnosis not present

## 2020-04-06 DIAGNOSIS — D649 Anemia, unspecified: Secondary | ICD-10-CM | POA: Diagnosis not present

## 2020-05-23 ENCOUNTER — Other Ambulatory Visit: Payer: Self-pay | Admitting: Internal Medicine

## 2020-05-23 DIAGNOSIS — I10 Essential (primary) hypertension: Secondary | ICD-10-CM

## 2020-07-12 ENCOUNTER — Ambulatory Visit: Payer: Medicare PPO | Admitting: Internal Medicine

## 2020-07-24 ENCOUNTER — Other Ambulatory Visit: Payer: Self-pay

## 2020-07-24 ENCOUNTER — Encounter: Payer: Self-pay | Admitting: Internal Medicine

## 2020-07-24 ENCOUNTER — Ambulatory Visit: Payer: Medicare PPO | Admitting: Internal Medicine

## 2020-07-24 VITALS — BP 130/78 | HR 76 | Temp 98.3°F | Ht 65.0 in | Wt 105.0 lb

## 2020-07-24 DIAGNOSIS — K649 Unspecified hemorrhoids: Secondary | ICD-10-CM

## 2020-07-24 DIAGNOSIS — E039 Hypothyroidism, unspecified: Secondary | ICD-10-CM | POA: Diagnosis not present

## 2020-07-24 DIAGNOSIS — I10 Essential (primary) hypertension: Secondary | ICD-10-CM

## 2020-07-24 DIAGNOSIS — K5900 Constipation, unspecified: Secondary | ICD-10-CM

## 2020-07-24 DIAGNOSIS — R739 Hyperglycemia, unspecified: Secondary | ICD-10-CM | POA: Diagnosis not present

## 2020-07-24 MED ORDER — HYDROCORTISONE (PERIANAL) 2.5 % EX CREA: 1.0000 "application " | TOPICAL_CREAM | Freq: Two times a day (BID) | CUTANEOUS | 1 refills | Status: DC

## 2020-07-24 NOTE — Progress Notes (Signed)
Subjective:    Patient ID: Vanessa Clayton, female    DOB: November 26, 1925, 86 y.o.   MRN: [id number]  HPI  Here to f/u; overall doing ok,  Pt denies chest pain, increasing sob or doe, wheezing, orthopnea, PND, increased LE swelling, palpitations, dizziness or syncope.  Pt denies new neurological symptoms such as new headache, or facial or extremity weakness or numbness.  Pt denies polydipsia, polyuria, or low sugar episode.  Pt states overall good compliance with meds, mostly trying to follow appropriate diet, with wt overall stable,  but little exercise however.  Also with recent worsening hemorrhoid itching and discomfort mild intermittent x 2 wks.  Also has mild worsening constipation, but does not want laxative, just hard to pass.  Denies hyper or hypo thyroid symptoms such as voice, skin or hair change. Past Medical History:  Diagnosis Date  . Allergy   . Anemia 07/19/2018  . Asymptomatic PVCs 12/09/2012  . Benign hypertensive heart disease without heart failure 12/04/2010  . Bilateral hearing loss 06/15/2019  . Breast cancer ([place])    left  . Chronic anemia   . Chronic anxiety   . Constipation 01/10/2020  . Depression 12/04/2010  . Diastolic dysfunction 7/49/4496  . Dyspnea on exertion 06/17/2013  . Essential hypertension 12/21/2015  . History of TIA (transient ischemic attack) 2017, 2020  . HLD (hyperlipidemia) 12/21/2015  . HTN (hypertension)   . Hyperglycemia 06/15/2019  . Hyperthyroidism   . Hypothyroidism    following treatment for hyperthyroidism  . Medicare annual wellness visit, subsequent 03/12/2016  . Orthostatic hypotension 03/26/2015  . Peripheral neuropathy 07/19/2018  . Protein-calorie malnutrition, severe ([place]) 05/09/2015  . Sinus tachycardia 04/09/2015  . Syncope and collapse   . TIA (transient ischemic attack) 05/07/2015  . Vitamin D deficiency   . Weight loss, non-intentional 12/09/2012   Past Surgical History:  Procedure Laterality Date  . BREAST LUMPECTOMY  1998   left  with radiation  . CHOLECYSTECTOMY    . COLON SURGERY    . KNEE ARTHROSCOPY  10/15/05  . TONSILLECTOMY      reports that she has never smoked. She has never used smokeless tobacco. She reports that she does not drink alcohol and does not use drugs. family history includes Hypertension in her brother and mother. Allergies  Allergen Reactions  . Penicillins Other (See Comments) and Itching    Rash    Current Outpatient Medications on File Prior to Visit  Medication Sig Dispense Refill  . acetaminophen (TYLENOL) 500 MG tablet Take 500 mg by mouth every 6 (six) hours as needed for moderate pain.     Marland Kitchen amLODipine (NORVASC) 5 MG tablet TAKE 1 TABLET(5 MG) BY MOUTH DAILY 90 tablet 3  . atenolol (TENORMIN) 25 MG tablet Take 1 tablet (25 mg total) by mouth daily. 90 tablet 3  . clopidogrel (PLAVIX) 75 MG tablet TAKE 1 TABLET(75 MG) BY MOUTH DAILY 90 tablet 3  . escitalopram (LEXAPRO) 10 MG tablet Take 1 tablet (10 mg total) by mouth daily. 90 tablet 3  . Ferrous Sulfate (SLOW FE PO) Take 1 tablet by mouth daily.     Marland Kitchen levothyroxine (SYNTHROID) 75 MCG tablet Take 1 tablet (75 mcg total) by mouth daily before breakfast. 90 tablet 3  . polyethylene glycol powder (GLYCOLAX/MIRALAX) 17 GM/SCOOP powder Take 1 dose once a day as needed for constipation    . pravastatin (PRAVACHOL) 40 MG tablet TAKE 1 TABLET BY MOUTH DAILY 90 tablet 3  . Vitamin D, Ergocalciferol, (  DRISDOL) 50000 UNITS CAPS Take 50,000 Units by mouth every 14 (fourteen) days.      No current facility-administered medications on file prior to visit.   Review of Systems All otherwise neg per pt =    Objective:   Physical Exam BP 130/78   Pulse 76   Temp 98.3 F (36.8 C) (Oral)   Ht 5\' 5"  (1.651 m)   Wt 105 lb (47.6 kg)   SpO2 99%   BMI 17.47 kg/m  ,VS noted,  Constitutional: Pt appears in NAD HENT: Head: NCAT.  Right Ear: External ear normal.  Left Ear: External ear normal.  Eyes: . Pupils are equal, round, and reactive  to light. Conjunctivae and EOM are normal Nose: without d/c or deformity Neck: Neck supple. Gross normal ROM Cardiovascular: Normal rate and regular rhythm.   Pulmonary/Chest: Effort normal and breath sounds without rales or wheezing.  Abd:  Soft, NT, ND, + BS, no organomegaly Neurological: Pt is alert. At baseline orientation, motor grossly intact Skin: Skin is warm. No rashes, other new lesions, no LE edema Psychiatric: Pt behavior is normal without agitation  All otherwise neg per pt Lab Results  Component Value Date   WBC 5.9 01/10/2020   HGB 9.1 (L) 01/10/2020   HCT 29.6 (L) 01/10/2020   PLT 225.0 01/10/2020   GLUCOSE 108 (H) 01/10/2020   CHOL 128 01/10/2020   TRIG 73.0 01/10/2020   HDL 45.90 01/10/2020   LDLCALC 68 01/10/2020   ALT 5 01/10/2020   AST 13 01/10/2020   NA 136 01/10/2020   K 5.3 (H) 01/10/2020   CL 105 01/10/2020   CREATININE 1.23 (H) 01/10/2020   BUN 48 (H) 01/10/2020   CO2 25 01/10/2020   TSH 0.49 01/10/2020   INR 1.0 10/26/2018   HGBA1C 5.8 01/10/2020      Assessment & Plan:

## 2020-07-24 NOTE — Patient Instructions (Signed)
Please take all new medication as prescribed - the cream  Please continue all other medications as before, and refills have been done if requested.  Please have the pharmacy call with any other refills you may need.  Please continue your efforts at being more active, low cholesterol diet, and weight control.  Please keep your appointments with your specialists as you may have planned  Please make an Appointment to return in 6 months, or sooner if needed

## 2020-07-28 ENCOUNTER — Encounter: Payer: Self-pay | Admitting: Internal Medicine

## 2020-07-28 DIAGNOSIS — K649 Unspecified hemorrhoids: Secondary | ICD-10-CM | POA: Insufficient documentation

## 2020-07-28 NOTE — Assessment & Plan Note (Signed)
stable overall by history and exam, recent data reviewed with pt, and pt to continue medical treatment as before,  to f/u any worsening symptoms or concerns  

## 2020-07-28 NOTE — Assessment & Plan Note (Addendum)
Ok for [employer] topical cr prn,  to f/u any worsening symptoms or concerns  I spent 31 minutes in preparing to see the patient by review of recent labs, imaging and procedures, obtaining and reviewing separately obtained history, communicating with the patient and family or caregiver, ordering medications, tests or procedures, and documenting clinical information in the EHR including the differential Dx, treatment, and any further evaluation and other management of hemorrhoid, constipation, htn, hyperglycemia, hypothyroidism

## 2020-07-28 NOTE — Assessment & Plan Note (Signed)
Ok for colace bid prn otc,  to f/u any worsening symptoms or concerns

## 2020-12-26 ENCOUNTER — Ambulatory Visit: Payer: Medicare PPO | Admitting: Internal Medicine

## 2020-12-31 ENCOUNTER — Other Ambulatory Visit: Payer: Self-pay | Admitting: *Deleted

## 2020-12-31 MED ORDER — ATENOLOL 25 MG PO TABS: 25.0000 mg | ORAL_TABLET | Freq: Every day | ORAL | 3 refills | Status: DC

## 2021-01-16 ENCOUNTER — Telehealth: Payer: Self-pay | Admitting: Interventional Cardiology

## 2021-01-16 NOTE — Telephone Encounter (Signed)
Patient's daughter called in to see if patient appt on Friday 5/20 could be switch to VV because daughter stated patient had a fall and she is very fragile/week. Please advise

## 2021-01-16 NOTE — Telephone Encounter (Signed)
Will route to Dr. Irish Lack to see if ok to change to a virtual visit.

## 2021-01-17 NOTE — Telephone Encounter (Signed)
Daughter called office today and moved appointment to next week with Ermalinda Barrios, PA-C.  Cancellation note mentions that pt is sick.

## 2021-01-18 ENCOUNTER — Ambulatory Visit: Payer: Medicare PPO | Admitting: Interventional Cardiology

## 2021-01-21 NOTE — Progress Notes (Deleted)
Cardiology Office Note    Date:  01/21/2021   ID:  Pattijo, Juste [birth year], MRN [id number]   PCP:  Biagio Borg, MD   [place]  Cardiologist:  Larae Grooms, MD  Advanced Practice Provider:  No care team member to display Electrophysiologist:  None   [id number]   No chief complaint on file.   History of Present Illness:  Vanessa Clayton is a 87 y.o. female with history of hypertension, hypercholesterolemia, remote history of thyroid toxicosis and breast cancer.  Hospitalized in 2016 for possible TIA and echo showed moderate LVH LVEF 60 to 81% no embolic source.  She had normal carotid ultrasound and normal EEG.  Since turning 89 she has had trouble with elevated blood pressures.  Last saw Dr. Irish Lack 5/2021and was out of a atenolol for a week. No changes made.  Most recent echo 10/27/2018 normal LV function moderate LVH, DD severe TR.    Past Medical History:  Diagnosis Date  . Allergy   . Anemia 07/19/2018  . Asymptomatic PVCs 12/09/2012  . Benign hypertensive heart disease without heart failure 12/04/2010  . Bilateral hearing loss 06/15/2019  . Breast cancer ([place])    left  . Chronic anemia   . Chronic anxiety   . Constipation 01/10/2020  . Depression 12/04/2010  . Diastolic dysfunction 04/29/9370  . Dyspnea on exertion 06/17/2013  . Essential hypertension 12/21/2015  . History of TIA (transient ischemic attack) 2017, 2020  . HLD (hyperlipidemia) 12/21/2015  . HTN (hypertension)   . Hyperglycemia 06/15/2019  . Hyperthyroidism   . Hypothyroidism    following treatment for hyperthyroidism  . Medicare annual wellness visit, subsequent 03/12/2016  . Orthostatic hypotension 03/26/2015  . Peripheral neuropathy 07/19/2018  . Protein-calorie malnutrition, severe ([place]) 05/09/2015  . Sinus tachycardia 04/09/2015  . Syncope and collapse   . TIA (transient ischemic attack) 05/07/2015  . Vitamin D deficiency   . Weight loss, non-intentional 12/09/2012     Past Surgical History:  Procedure Laterality Date  . BREAST LUMPECTOMY  1998   left with radiation  . CHOLECYSTECTOMY    . COLON SURGERY    . KNEE ARTHROSCOPY  10/15/05  . TONSILLECTOMY      Current Medications: No outpatient medications have been marked as taking for the 01/23/21 encounter (Appointment) with Imogene Burn, PA-C.     Allergies:   Penicillins   Social History   Socioeconomic History  . Marital status: Widowed    Spouse name: Not on file  . Number of children: 1  . Years of education: 66  . Highest education level: Not on file  Occupational History  . Not on file  Tobacco Use  . Smoking status: Never Smoker  . Smokeless tobacco: Never Used  Vaping Use  . Vaping Use: Never used  Substance and Sexual Activity  . Alcohol use: No  . Drug use: No  . Sexual activity: Not on file  Other Topics Concern  . Not on file  Social History Narrative   Fun: Travel    12/13/2018 lives alone, grandchildren go daily and helps her   Denies abuse and feels safe at home.    Social Determinants of Health   Financial Resource Strain: Not on file  Food Insecurity: Not on file  Transportation Needs: Not on file  Physical Activity: Not on file  Stress: Not on file  Social Connections: Not on file     Family History:  The patient's ***family  history includes Hypertension in her brother and mother.   ROS:   Please see the history of present illness.    ROS All other systems reviewed and are negative.   PHYSICAL EXAM:   VS:  There were no vitals taken for this visit.  Physical Exam  GEN: Well nourished, well developed, in no acute distress  HEENT: normal  Neck: no JVD, carotid bruits, or masses Cardiac:RRR; no murmurs, rubs, or gallops  Respiratory:  clear to auscultation bilaterally, normal work of breathing GI: soft, nontender, nondistended, + BS Ext: without cyanosis, clubbing, or edema, Good distal pulses bilaterally MS: no deformity or atrophy  Skin:  warm and dry, no rash Neuro:  Alert and Oriented x 3, Strength and sensation are intact Psych: euthymic mood, full affect  Wt Readings from Last 3 Encounters:  07/24/20 105 lb (47.6 kg)  01/19/20 105 lb 12.8 oz (48 kg)  01/10/20 105 lb (47.6 kg)      Studies/Labs Reviewed:   EKG:  EKG is*** ordered today.  The ekg ordered today demonstrates ***  Recent Labs: No results found for requested labs within last 8760 hours.   Lipid Panel    Component Value Date/Time   CHOL 128 01/10/2020 1230   TRIG 73.0 01/10/2020 1230   HDL 45.90 01/10/2020 1230   CHOLHDL 3 01/10/2020 1230   VLDL 14.6 01/10/2020 1230   LDLCALC 68 01/10/2020 1230    Additional studies/ records that were reviewed today include:  2D echo 10/27/2018  IMPRESSIONS     1. The left ventricle has hyperdynamic systolic function, with an  ejection fraction of >65%. The cavity size was normal. There is moderately  increased left ventricular wall thickness. Left ventricular diastolic  Doppler parameters are consistent with  impaired relaxation.   2. The right ventricle has normal systolic function. The cavity was  normal. There is no increase in right ventricular wall thickness. Right  ventricular systolic pressure is moderately elevated with an estimated  pressure of 44.2 mmHg.   3. The mitral valve is normal in structure. Mild calcification of the  anterior mitral valve leaflet.   4. The tricuspid valve is normal in structure. Tricuspid valve  regurgitation is moderate-severe.   5. The aortic valve is tricuspid.   6. The pulmonic valve was normal in structure.   Carotid Dopplers 10/2018 Summary:  Right Carotid: Velocities in the right ICA are consistent with a 1-39%  stenosis.   Left Carotid: Velocities in the left ICA are consistent with a 1-39%  stenosis.   Vertebrals: Bilateral vertebral arteries demonstrate antegrade flow.  Subclavians: Normal flow hemodynamics were seen in bilateral subclavian                arteries.   *See table(s) above for measurements and observations.      Risk Assessment/Calculations:   {Does this patient have ATRIAL FIBRILLATION?[phone]}     ASSESSMENT:    No diagnosis found.   PLAN:  In order of problems listed above:  Hypertension  Hyperlipidemia  Diastolic dysfunction  History of TIA Shared Decision Making/Informed Consent   {Are you ordering a CV Procedure (e.g. stress test, cath, DCCV, TEE, etc)?   Press F2        :[phone]}    Medication Adjustments/Labs and Tests Ordered: Current medicines are reviewed at length with the patient today.  Concerns regarding medicines are outlined above.  Medication changes, Labs and Tests ordered today are listed in the Patient Instructions below. There are no Patient  Instructions on file for this visit.   Sumner Boast, PA-C  01/21/2021 1:19 PM    [facility] [place], [place], [place]  [address] Phone: [phone]; Fax: [phone]

## 2021-01-23 ENCOUNTER — Ambulatory Visit: Payer: Medicare PPO | Admitting: Physician Assistant

## 2021-01-25 ENCOUNTER — Encounter: Payer: Self-pay | Admitting: Internal Medicine

## 2021-01-25 ENCOUNTER — Emergency Department (HOSPITAL_COMMUNITY): Payer: Medicare PPO

## 2021-01-25 ENCOUNTER — Ambulatory Visit: Payer: Medicare Other | Admitting: Internal Medicine

## 2021-01-25 ENCOUNTER — Encounter (HOSPITAL_COMMUNITY): Payer: Self-pay | Admitting: Emergency Medicine

## 2021-01-25 ENCOUNTER — Other Ambulatory Visit: Payer: Self-pay

## 2021-01-25 ENCOUNTER — Emergency Department (HOSPITAL_COMMUNITY)
Admission: EM | Admit: 2021-01-25 | Discharge: 2021-01-25 | Disposition: A | Discharge status: Home / Self Care | Payer: Medicare PPO | Attending: Emergency Medicine | Admitting: Emergency Medicine

## 2021-01-25 DIAGNOSIS — I119 Hypertensive heart disease without heart failure: Secondary | ICD-10-CM | POA: Insufficient documentation

## 2021-01-25 DIAGNOSIS — Z743 Need for continuous supervision: Secondary | ICD-10-CM | POA: Diagnosis not present

## 2021-01-25 DIAGNOSIS — Z853 Personal history of malignant neoplasm of breast: Secondary | ICD-10-CM | POA: Insufficient documentation

## 2021-01-25 DIAGNOSIS — R6889 Other general symptoms and signs: Secondary | ICD-10-CM | POA: Diagnosis not present

## 2021-01-25 DIAGNOSIS — D649 Anemia, unspecified: Secondary | ICD-10-CM

## 2021-01-25 DIAGNOSIS — W19XXXA Unspecified fall, initial encounter: Secondary | ICD-10-CM | POA: Diagnosis not present

## 2021-01-25 DIAGNOSIS — E039 Hypothyroidism, unspecified: Secondary | ICD-10-CM | POA: Diagnosis not present

## 2021-01-25 DIAGNOSIS — Z9181 History of falling: Secondary | ICD-10-CM | POA: Diagnosis not present

## 2021-01-25 DIAGNOSIS — R1031 Right lower quadrant pain: Secondary | ICD-10-CM | POA: Diagnosis not present

## 2021-01-25 DIAGNOSIS — Z723 Lack of physical exercise: Secondary | ICD-10-CM | POA: Diagnosis not present

## 2021-01-25 DIAGNOSIS — I959 Hypotension, unspecified: Secondary | ICD-10-CM | POA: Insufficient documentation

## 2021-01-25 DIAGNOSIS — Z7902 Long term (current) use of antithrombotics/antiplatelets: Secondary | ICD-10-CM | POA: Diagnosis not present

## 2021-01-25 DIAGNOSIS — Z79899 Other long term (current) drug therapy: Secondary | ICD-10-CM | POA: Insufficient documentation

## 2021-01-25 DIAGNOSIS — R0602 Shortness of breath: Secondary | ICD-10-CM | POA: Diagnosis present

## 2021-01-25 DIAGNOSIS — R7989 Other specified abnormal findings of blood chemistry: Secondary | ICD-10-CM | POA: Diagnosis not present

## 2021-01-25 DIAGNOSIS — R5381 Other malaise: Secondary | ICD-10-CM

## 2021-01-25 DIAGNOSIS — M25551 Pain in right hip: Secondary | ICD-10-CM | POA: Diagnosis not present

## 2021-01-25 DIAGNOSIS — R531 Weakness: Secondary | ICD-10-CM | POA: Diagnosis not present

## 2021-01-25 LAB — CBC WITH DIFFERENTIAL/PLATELET
Abs Immature Granulocytes: 0.03 10*3/uL (ref 0.00–0.07)
Basophils Absolute: 0 10*3/uL (ref 0.0–0.1)
Basophils Relative: 0 %
Eosinophils Absolute: 0 10*3/uL (ref 0.0–0.5)
Eosinophils Relative: 0 %
HCT: 25.8 % — ABNORMAL LOW (ref 36.0–46.0)
Hemoglobin: 8.1 g/dL — ABNORMAL LOW (ref 12.0–15.0)
Immature Granulocytes: 1 %
Lymphocytes Relative: 14 %
Lymphs Abs: 0.8 10*3/uL (ref 0.7–4.0)
MCH: 24.5 pg — ABNORMAL LOW (ref 26.0–34.0)
MCHC: 31.4 g/dL (ref 30.0–36.0)
MCV: 78.2 fL — ABNORMAL LOW (ref 80.0–100.0)
Monocytes Absolute: 0.3 10*3/uL (ref 0.1–1.0)
Monocytes Relative: 5 %
Neutro Abs: 4.4 10*3/uL (ref 1.7–7.7)
Neutrophils Relative %: 80 %
Platelets: 242 10*3/uL (ref 150–400)
RBC: 3.3 MIL/uL — ABNORMAL LOW (ref 3.87–5.11)
RDW: 15.6 % — ABNORMAL HIGH (ref 11.5–15.5)
WBC: 5.5 10*3/uL (ref 4.0–10.5)
nRBC: 0 % (ref 0.0–0.2)

## 2021-01-25 LAB — COMPREHENSIVE METABOLIC PANEL
ALT: 10 U/L (ref 0–44)
AST: 19 U/L (ref 15–41)
Albumin: 3.1 g/dL — ABNORMAL LOW (ref 3.5–5.0)
Alkaline Phosphatase: 79 U/L (ref 38–126)
Anion gap: 9 (ref 5–15)
BUN: 33 mg/dL — ABNORMAL HIGH (ref 8–23)
CO2: 22 mmol/L (ref 22–32)
Calcium: 8.7 mg/dL — ABNORMAL LOW (ref 8.9–10.3)
Chloride: 103 mmol/L (ref 98–111)
Creatinine, Ser: 0.98 mg/dL (ref 0.44–1.00)
GFR, Estimated: 54 mL/min — ABNORMAL LOW (ref >60)
Glucose, Bld: 99 mg/dL (ref 70–99)
Potassium: 3.8 mmol/L (ref 3.5–5.1)
Sodium: 134 mmol/L — ABNORMAL LOW (ref 135–145)
Total Bilirubin: 0.2 mg/dL — ABNORMAL LOW (ref 0.3–1.2)
Total Protein: 7.1 g/dL (ref 6.5–8.1)

## 2021-01-25 LAB — TROPONIN I (HIGH SENSITIVITY): Troponin I (High Sensitivity): 6 ng/L (ref <18)

## 2021-01-25 LAB — D-DIMER, QUANTITATIVE: D-Dimer, Quant: 6.17 ug/mL-FEU — ABNORMAL HIGH (ref 0.00–0.50)

## 2021-01-25 LAB — TYPE AND SCREEN
ABO/RH(D): A POS
Antibody Screen: NEGATIVE

## 2021-01-25 LAB — BRAIN NATRIURETIC PEPTIDE: B Natriuretic Peptide: 159.9 pg/mL — ABNORMAL HIGH (ref 0.0–100.0)

## 2021-01-25 MED ORDER — SODIUM CHLORIDE (PF) 0.9 % IJ SOLN
INTRAMUSCULAR | Status: AC
  Filled 2021-01-25: qty 50

## 2021-01-25 MED ORDER — IOHEXOL 350 MG/ML SOLN
80.0000 mL | Freq: Once | INTRAVENOUS | Status: AC | PRN
  Administered 2021-01-25: 80 mL via INTRAVENOUS

## 2021-01-25 MED ORDER — LACTATED RINGERS IV BOLUS
500.0000 mL | Freq: Once | INTRAVENOUS | Status: AC
  Administered 2021-01-25: 500 mL via INTRAVENOUS

## 2021-01-25 NOTE — Progress Notes (Signed)
Patient ID: Vanessa Clayton, female   DOB: [birth year], 87 y.o.   MRN: [id number]        Chief Complaint: low blood pressure, recent fall, and right groin pain       HPI:  Vanessa Clayton is a 87 y.o. female here with above, lives alone but checked by family often, who fell apr 22 with right lateral hip pain but pt declined ED at the time;  that pain to lateral hip area seemed to improve in a few days, but has had persistent right groin pain since than, spending much more time in bed, has reduced po intake, has good med compliance including her amlodipine, now with several days worsening energy and generalized weakness (but no further falls).  Pt mentions "constipation" but daughter states stools are loose but not sure of the color, no obvious BRB.  [facility] 8.3 at [facility] about 9 mo ago.  Pt denies chest pain, increased sob or doe, wheezing, orthopnea, PND, increased LE swelling, palpitations, or syncope.   Pt denies polydipsia, polyuria, or new focal neuro s/s.   Pt denies fever, wt loss, night sweats, loss of appetite, or other constitutional symptoms  No other new complaints - "I just want to feel better"  Initial BP here in the office is 72/48       Wt Readings from Last 3 Encounters:  01/25/21 91 lb (41.3 kg)  07/24/20 105 lb (47.6 kg)  01/19/20 105 lb 12.8 oz (48 kg)   BP Readings from Last 3 Encounters:  01/25/21 (!) 72/48  07/24/20 130/78  01/19/20 110/60         Past Medical History:  Diagnosis Date  . Allergy   . Anemia 07/19/2018  . Asymptomatic PVCs 12/09/2012  . Benign hypertensive heart disease without heart failure 12/04/2010  . Bilateral hearing loss 06/15/2019  . Breast cancer ([place])    left  . Chronic anemia   . Chronic anxiety   . Constipation 01/10/2020  . Depression 12/04/2010  . Diastolic dysfunction 10/30/6008  . Dyspnea on exertion 06/17/2013  . Essential hypertension 12/21/2015  . History of TIA (transient ischemic attack) 2017, 2020  . HLD (hyperlipidemia) 12/21/2015  . HTN  (hypertension)   . Hyperglycemia 06/15/2019  . Hyperthyroidism   . Hypothyroidism    following treatment for hyperthyroidism  . Medicare annual wellness visit, subsequent 03/12/2016  . Orthostatic hypotension 03/26/2015  . Peripheral neuropathy 07/19/2018  . Protein-calorie malnutrition, severe ([place]) 05/09/2015  . Sinus tachycardia 04/09/2015  . Syncope and collapse   . TIA (transient ischemic attack) 05/07/2015  . Vitamin D deficiency   . Weight loss, non-intentional 12/09/2012   Past Surgical History:  Procedure Laterality Date  . BREAST LUMPECTOMY  1998   left with radiation  . CHOLECYSTECTOMY    . COLON SURGERY    . KNEE ARTHROSCOPY  10/15/05  . TONSILLECTOMY      reports that she has never smoked. She has never used smokeless tobacco. She reports that she does not drink alcohol and does not use drugs. family history includes Hypertension in her brother and mother. Allergies  Allergen Reactions  . Penicillins Other (See Comments) and Itching    Rash    Current Outpatient Medications on File Prior to Visit  Medication Sig Dispense Refill  . acetaminophen (TYLENOL) 500 MG tablet Take 500 mg by mouth every 6 (six) hours as needed for moderate pain.     Marland Kitchen amLODipine (NORVASC) 5 MG tablet TAKE 1 TABLET(5 MG)  BY MOUTH DAILY 90 tablet 3  . atenolol (TENORMIN) 25 MG tablet Take 1 tablet (25 mg total) by mouth daily. 90 tablet 3  . clopidogrel (PLAVIX) 75 MG tablet TAKE 1 TABLET(75 MG) BY MOUTH DAILY 90 tablet 3  . escitalopram (LEXAPRO) 10 MG tablet Take 1 tablet (10 mg total) by mouth daily. 90 tablet 3  . Ferrous Sulfate (SLOW FE PO) Take 1 tablet by mouth daily.    . hydrocortisone (ANUSOL-HC) 2.5 % rectal cream Place 1 application rectally 2 (two) times daily. 30 g 1  . levothyroxine (SYNTHROID) 75 MCG tablet Take 1 tablet (75 mcg total) by mouth daily before breakfast. 90 tablet 3  . polyethylene glycol powder (GLYCOLAX/MIRALAX) 17 GM/SCOOP powder Take 1 dose once a day as needed for  constipation    . pravastatin (PRAVACHOL) 40 MG tablet TAKE 1 TABLET BY MOUTH DAILY 90 tablet 3   No current facility-administered medications on file prior to visit.        ROS:  All others reviewed and negative.  Objective        PE:  BP (!) 72/48 (BP Location: Right Arm, Patient Position: Sitting, Cuff Size: Normal)   Pulse 89   Temp 98.5 F (36.9 C) (Oral)   Ht 5\' 5"  (1.651 m)   Wt 91 lb (41.3 kg)   SpO2 100%   BMI 15.14 kg/m                 Constitutional: Pt appears in NAD               HENT: Head: NCAT.                Right Ear: External ear normal.                 Left Ear: External ear normal.                Eyes: . Pupils are equal, round, and reactive to light. Conjunctivae and EOM are normal               Nose: without d/c or deformity               Neck: Neck supple. Gross normal ROM               Cardiovascular: Normal rate and regular rhythm.                 Pulmonary/Chest: Effort normal and breath sounds without rales or wheezing.                Abd:  Soft, NT, ND, + BS, no organomegaly               Neurological: Pt is alert. At baseline orientation, motor grossly intact               Skin: Skin is warm. No rashes, no other new lesions, LE edema - none               Psychiatric: Pt behavior is normal without agitation   Micro: none  Cardiac tracings I have personally interpreted today:  none  Pertinent Radiological findings (summarize): none   Lab Results  Component Value Date   WBC 5.9 01/10/2020   HGB 9.1 (L) 01/10/2020   HCT 29.6 (L) 01/10/2020   PLT 225.0 01/10/2020   GLUCOSE 108 (H) 01/10/2020   CHOL 128 01/10/2020   TRIG 73.0 01/10/2020   HDL 45.90 01/10/2020  LDLCALC 68 01/10/2020   ALT 5 01/10/2020   AST 13 01/10/2020   NA 136 01/10/2020   K 5.3 (H) 01/10/2020   CL 105 01/10/2020   CREATININE 1.23 (H) 01/10/2020   BUN 48 (H) 01/10/2020   CO2 25 01/10/2020   TSH 0.49 01/10/2020   INR 1.0 10/26/2018   HGBA1C 5.8 01/10/2020    Assessment/Plan:  Vanessa Clayton is a 87 y.o. Black or African American [2] female with  has a past medical history of Allergy, Anemia (07/19/2018), Asymptomatic PVCs (12/09/2012), Benign hypertensive heart disease without heart failure (12/04/2010), Bilateral hearing loss (06/15/2019), Breast cancer ([place]), Chronic anemia, Chronic anxiety, Constipation (01/10/2020), Depression (0/05/3111), Diastolic dysfunction (1/62/4469), Dyspnea on exertion (06/17/2013), Essential hypertension (12/21/2015), History of TIA (transient ischemic attack) (2017, 2020), HLD (hyperlipidemia) (12/21/2015), HTN (hypertension), Hyperglycemia (06/15/2019), Hyperthyroidism, Hypothyroidism, Medicare annual wellness visit, subsequent (03/12/2016), Orthostatic hypotension (03/26/2015), Peripheral neuropathy (07/19/2018), Protein-calorie malnutrition, severe ([place]) (05/09/2015), Sinus tachycardia (04/09/2015), Syncope and collapse, TIA (transient ischemic attack) (05/07/2015), Vitamin D deficiency, and Weight loss, non-intentional (12/09/2012).  Hypotension Most likely amlodipine and low volume and possible anemia related; pt asked to report to ED now for further evaluation  History of recent fall Will likely need HH with PT  Right groin pain Cant r/o pelvic fx post fall - needs xrays  Anemia Severe, not taking her iron recently, will need labs at ED visit  Followup: Return if symptoms worsen or fail to improve.  Vanessa Cower, MD 01/25/2021 3:26 PM [facility]

## 2021-01-25 NOTE — ED Triage Notes (Signed)
BIBA Per EMS: Pt coming from Dr. office noted to be hypotensive at 80/40 ; Weakness over past few months  Denies SHOB  20 G R FA  250 CC Fluids given en route  90 HR  108/58 recent BP

## 2021-01-25 NOTE — Assessment & Plan Note (Signed)
Severe, not taking her iron recently, will need labs at ED visit

## 2021-01-25 NOTE — Assessment & Plan Note (Signed)
Most likely amlodipine and low volume and possible anemia related; pt asked to report to ED now for further evaluation

## 2021-01-25 NOTE — Assessment & Plan Note (Signed)
Cant r/o pelvic fx post fall - needs xrays

## 2021-01-25 NOTE — ED Provider Notes (Signed)
[facility] DEPT Provider Note   CSN: [id number] Arrival date & time: 01/25/21  1624     History Chief Complaint  Patient presents with  . Hypotension    Vanessa Clayton is a 87 y.o. female.  Patient is a 87 year old female presenting today from her PCP office due to hypotension.  Patient has a history of chronic anemia, diastolic dysfunction, hypertension, hyperlipidemia, malnutrition who reports that she went to her doctor today because she has been feeling tired and weak.  Patient reports this all started after she had a fall approximately 1 month ago 12-21-20.  She was walking when her left knee gave out and she fell onto her right side.  She initially had to crawl to her bed and had right groin pain for approximately 4 weeks.  She has been mostly laying in bed because it hurts to move but is starting to feel little bit better.  However now when she attempts to get out of bed and walk she becomes short of breath feels extremely fatigued.  She has been eating but reports her appetite is fairly poor.  She has not had cough, fever, congestion.  This week she has felt some nausea but denies any vomiting.  She has been taking her medications as prescribed.  She reports her family is very involved and helps her with what she needs.  She has not seen any provider since the fall until today.  When she got to the office today her blood pressure was in the 06T systolic and they sent her here for further care.  She has not had any chest pain while in bed or with any ambulating.  She does report a history of constipation and intermittent rectal prolapse but daughter reported to PCP that her stool has been fairly loose.  She reports her last bowel movement was prior to arrival today but very small in nature.  He has no shortness of breath while lying in bed or lying flat.  The history is provided by the patient and medical records.       Past Medical History:  Diagnosis Date   . Allergy   . Anemia 07/19/2018  . Asymptomatic PVCs 12/09/2012  . Benign hypertensive heart disease without heart failure 12/04/2010  . Bilateral hearing loss 06/15/2019  . Breast cancer ([place])    left  . Chronic anemia   . Chronic anxiety   . Constipation 01/10/2020  . Depression 12/04/2010  . Diastolic dysfunction 0/16/0109  . Dyspnea on exertion 06/17/2013  . Essential hypertension 12/21/2015  . History of TIA (transient ischemic attack) 2017, 2020  . HLD (hyperlipidemia) 12/21/2015  . HTN (hypertension)   . Hyperglycemia 06/15/2019  . Hyperthyroidism   . Hypothyroidism    following treatment for hyperthyroidism  . Medicare annual wellness visit, subsequent 03/12/2016  . Orthostatic hypotension 03/26/2015  . Peripheral neuropathy 07/19/2018  . Protein-calorie malnutrition, severe ([place]) 05/09/2015  . Sinus tachycardia 04/09/2015  . Syncope and collapse   . TIA (transient ischemic attack) 05/07/2015  . Vitamin D deficiency   . Weight loss, non-intentional 12/09/2012    Patient Active Problem List   Diagnosis Date Noted  . Hypotension 01/25/2021  . History of recent fall 01/25/2021  . Right groin pain 01/25/2021  . Hemorrhoid 07/28/2020  . Constipation 01/10/2020  . Bilateral hearing loss 06/15/2019  . Hyperglycemia 06/15/2019  . Peripheral neuropathy 07/19/2018  . Anemia 07/19/2018  . Medicare annual wellness visit, subsequent 03/12/2016  . Encounter for  well adult exam with abnormal findings 03/12/2016  . Essential hypertension 12/21/2015  . Diastolic dysfunction 57/32/2025  . History of TIA (transient ischemic attack) 10/01/2015  . Protein-calorie malnutrition, severe ([place]) 05/09/2015  . TIA (transient ischemic attack) 05/07/2015  . Sinus tachycardia 04/09/2015  . Orthostatic hypotension 03/26/2015  . Dyspnea on exertion 06/17/2013  . Asymptomatic PVCs 12/09/2012  . Weight loss, non-intentional 12/09/2012  . Benign hypertensive heart disease without heart failure 12/04/2010   . Hypothyroidism 12/04/2010  . History of breast cancer 12/04/2010  . Depression 12/04/2010    Past Surgical History:  Procedure Laterality Date  . BREAST LUMPECTOMY  1998   left with radiation  . CHOLECYSTECTOMY    . COLON SURGERY    . KNEE ARTHROSCOPY  10/15/05  . TONSILLECTOMY       OB History   No obstetric history on file.     Family History  Problem Relation Age of Onset  . Hypertension Mother   . Hypertension Brother   . Heart attack Neg Hx   . Stroke Neg Hx     Social History   Tobacco Use  . Smoking status: Never Smoker  . Smokeless tobacco: Never Used  Vaping Use  . Vaping Use: Never used  Substance Use Topics  . Alcohol use: No  . Drug use: No    Home Medications Prior to Admission medications   Medication Sig Start Date End Date Taking? Authorizing Provider  acetaminophen (TYLENOL) 500 MG tablet Take 500 mg by mouth every 6 (six) hours as needed for moderate pain.     [provider]  amLODipine (NORVASC) 5 MG tablet TAKE 1 TABLET(5 MG) BY MOUTH DAILY 05/23/20   Biagio Borg, MD  atenolol (TENORMIN) 25 MG tablet Take 1 tablet (25 mg total) by mouth daily. 12/31/20   Jettie Booze, MD  clopidogrel (PLAVIX) 75 MG tablet TAKE 1 TABLET(75 MG) BY MOUTH DAILY 06/15/19   Biagio Borg, MD  escitalopram (LEXAPRO) 10 MG tablet Take 1 tablet (10 mg total) by mouth daily. 06/15/19   Biagio Borg, MD  Ferrous Sulfate (SLOW FE PO) Take 1 tablet by mouth daily.    [provider]  hydrocortisone (ANUSOL-HC) 2.5 % rectal cream Place 1 application rectally 2 (two) times daily. 07/24/20   Biagio Borg, MD  levothyroxine (SYNTHROID) 75 MCG tablet Take 1 tablet (75 mcg total) by mouth daily before breakfast. 06/15/19   Biagio Borg, MD  polyethylene glycol powder (GLYCOLAX/MIRALAX) 17 GM/SCOOP powder Take 1 dose once a day as needed for constipation    [provider]  pravastatin (PRAVACHOL) 40 MG tablet TAKE 1 TABLET BY MOUTH DAILY  05/23/20   Biagio Borg, MD    Allergies    Penicillins  Review of Systems   Review of Systems  All other systems reviewed and are negative.   Physical Exam Updated Vital Signs BP 111/75   Pulse 82   Temp 98.2 F (36.8 C) (Oral)   Resp (!) 28   SpO2 96%   Physical Exam Vitals and nursing note reviewed.  Constitutional:      General: She is not in acute distress.    Appearance: Normal appearance. She is well-developed and underweight.  HENT:     Head: Normocephalic and atraumatic.  Eyes:     Pupils: Pupils are equal, round, and reactive to light.     Comments: Pale conjunctive a  Cardiovascular:     Rate and Rhythm: Normal rate  and regular rhythm.     Heart sounds: Normal heart sounds. No murmur heard. No friction rub.  Pulmonary:     Effort: Pulmonary effort is normal.     Breath sounds: Normal breath sounds. No wheezing or rales.  Abdominal:     General: Bowel sounds are normal. There is no distension.     Palpations: Abdomen is soft.     Tenderness: There is no abdominal tenderness. There is no guarding or rebound.  Musculoskeletal:        General: No tenderness. Normal range of motion.     Right lower leg: No edema.     Left lower leg: No edema.     Comments: No edema.  Full ROM of the right hip.  Skin:    General: Skin is warm and dry.     Coloration: Skin is pale.     Findings: No rash.  Neurological:     Mental Status: She is alert and oriented to person, place, and time. Mental status is at baseline.     Cranial Nerves: No cranial nerve deficit.  Psychiatric:        Mood and Affect: Mood normal.        Behavior: Behavior normal.     ED Results / Procedures / Treatments   Labs (all labs ordered are listed, but only abnormal results are displayed) Labs Reviewed  CBC WITH DIFFERENTIAL/PLATELET - Abnormal; Notable for the following components:      Result Value   RBC 3.30 (*)    Hemoglobin 8.1 (*)    HCT 25.8 (*)    MCV 78.2 (*)    MCH 24.5 (*)     RDW 15.6 (*)    All other components within normal limits  COMPREHENSIVE METABOLIC PANEL - Abnormal; Notable for the following components:   Sodium 134 (*)    BUN 33 (*)    Calcium 8.7 (*)    Albumin 3.1 (*)    Total Bilirubin 0.2 (*)    GFR, Estimated 54 (*)    All other components within normal limits  BRAIN NATRIURETIC PEPTIDE - Abnormal; Notable for the following components:   B Natriuretic Peptide 159.9 (*)    All other components within normal limits  D-DIMER, QUANTITATIVE - Abnormal; Notable for the following components:   D-Dimer, Quant 6.17 (*)    All other components within normal limits  URINALYSIS, ROUTINE W REFLEX MICROSCOPIC  TYPE AND SCREEN  ABO/RH  TROPONIN I (HIGH SENSITIVITY)  TROPONIN I (HIGH SENSITIVITY)    EKG EKG Interpretation  Date/Time:  Friday Jan 25 2021 17:31:55 EDT Ventricular Rate:  78 PR Interval:  180 QRS Duration: 81 QT Interval:  399 QTC Calculation: 455 R Axis:   -22 Text Interpretation: Sinus rhythm Borderline left axis deviation Probable anteroseptal infarct, old No significant change since last tracing Confirmed by Blanchie Dessert [id number]) on 01/25/2021 5:42:39 PM   Radiology CT Angio Chest PE W and/or Wo Contrast  Result Date: 01/25/2021 CLINICAL DATA:  Positive D-dimer, weakness for several months, hypotension EXAM: CT ANGIOGRAPHY CHEST WITH CONTRAST TECHNIQUE: Multidetector CT imaging of the chest was performed using the standard protocol during bolus administration of intravenous contrast. Multiplanar CT image reconstructions and MIPs were obtained to evaluate the vascular anatomy. CONTRAST:  65mL OMNIPAQUE IOHEXOL 350 MG/ML SOLN COMPARISON:  01/25/2021, 01/31/2005 FINDINGS: Cardiovascular: This is a technically adequate evaluation of the pulmonary vasculature. No filling defects or pulmonary emboli. The heart is not enlarged. There is no pericardial effusion.  Evaluation of the thoracic aorta is limited due to the phase of contrast  enhancement. Moderate atherosclerosis within the aortic arch. Grossly normal caliber of the thoracic aorta. Mediastinum/Nodes: No enlarged mediastinal, hilar, or axillary lymph nodes. Thyroid gland, trachea, and esophagus demonstrate no significant findings. Lungs/Pleura: No acute airspace disease, effusion, or pneumothorax. Central airways are widely patent. Upper Abdomen: Multiple bilateral renal cortical hypodensities are identified, likely reflecting cysts. No acute upper abdominal findings. There is moderate atherosclerosis of the abdominal aorta. Musculoskeletal: There are no acute or destructive bony lesions. Reconstructed images demonstrate no additional findings. Review of the MIP images confirms the above findings. IMPRESSION: 1. No evidence of pulmonary embolus. 2. No acute intrathoracic process. 3.  Aortic Atherosclerosis (ICD10-I70.0). Electronically Signed   By: Randa Ngo M.D.   On: 01/25/2021 20:33   DG Chest Port 1 View  Result Date: 01/25/2021 CLINICAL DATA:  Weakness for several months with hypotension EXAM: PORTABLE CHEST 1 VIEW COMPARISON:  01/10/2020 FINDINGS: Cardiac shadow is stable. Aortic calcifications are noted. The lungs are well aerated bilaterally. No focal infiltrate or effusion is seen. No acute bony abnormality is noted. Postsurgical changes in the left axilla are seen. IMPRESSION: No acute abnormality noted. Electronically Signed   By: Inez Catalina M.D.   On: 01/25/2021 17:40   DG Hip Unilat W or Wo Pelvis 2-3 Views Right  Result Date: 01/25/2021 CLINICAL DATA:  Weakness with fall 1 month ago and right hip pain, initial encounter EXAM: DG HIP (WITH OR WITHOUT PELVIS) 3V RIGHT COMPARISON:  None. FINDINGS: Pelvic ring shows irregularity in the inferior pubic ramus on the right with some callus formation as well as widening of the superior pubic ramus adjacent to the pubic symphysis. Degenerative changes with loss of the superior joint space are seen. No discrete femoral  fracture is noted. Degenerative changes of lumbar spine are seen. Left proximal femur appears within normal limits. IMPRESSION: Changes consistent with prior fractures of the superior and inferior pubic rami on the right with some callus formation. No definitive femoral fracture is seen although extensive degenerative changes are noted. CT may be helpful for confirmation as clinically indicated. Electronically Signed   By: Inez Catalina M.D.   On: 01/25/2021 17:43    Procedures Procedures   Medications Ordered in ED Medications - No data to display  ED Course  I have reviewed the triage vital signs and the nursing notes.  Pertinent labs & imaging results that were available during my care of the patient were reviewed by me and considered in my medical decision making (see chart for details).    MDM Rules/Calculators/A&P                          Elderly female presenting today with complaints of weakness and exertional dyspnea.  This has been ongoing for the last month after patient had a fall and is now been lying in bed.  She was hypotensive at PCPs office but here blood pressure is improved at 111/75.  This is with patient lying down.  She has poor appetite but reports is still been eating.  She does have a history of anemia so concern for worsening anemia.  Possible fracture 1 month ago when she had a fall but never was evaluated.  Will x-ray her right hip today given her pain has improved but to see if she had recent fracture.  She has no abdominal pain and denies any black stool.  Also has a history of cardiac disease.  Will get troponin and EKG.  Chest x-ray is pending.  Patient does not have overt signs of fluid overload today.  She denies any infectious symptoms.  7:01 PM CBC today with hemoglobin of 8.1 which was 8.3  9 months ago and stable, platelet count within normal limits, white count within normal limits, CMP with normal renal function and anion gap.  Troponin is normal at 6, BNP  159 and chest x-ray normal and no evidence of fluid overload.  Hip film shows changes consistent with prior superior and inferior pubic rami fractures on the right which is most likely what was causing her pain however seems to be improving now.  Will check D-dimer given other normal labs and recent fracture and immobilization as there is no other explanation for her shortness of breath and fatigue.  Could also be deconditioning as she has not been nearly as mobile recently.  Blood pressure has remained stable here.  9:15 PM D-dimer was elevated at 6 but CT was negative for acute findings.  Patient's blood pressure improved after 500 mL bolus now 140/75.  We will have her hold amlodipine and only do half of her atenolol and check her blood pressures at home.  Patient will follow up with Dr. Jenny Reichmann about her ongoing stable anemia.  Also face-to-face home health was ordered for the patient.  Patient's daughter is present and is agreeable to this plan.  Patient will be discharged home.  MDM Number of Diagnoses or Management Options   Amount and/or Complexity of Data Reviewed Clinical lab tests: ordered and reviewed Tests in the radiology section of CPT: ordered and reviewed Tests in the medicine section of CPT: ordered and reviewed Decide to obtain previous medical records or to obtain history from someone other than the patient: yes Obtain history from someone other than the patient: yes Review and summarize past medical records: yes Discuss the patient with other providers: no Independent visualization of images, tracings, or specimens: yes  Risk of Complications, Morbidity, and/or Mortality Presenting problems: high Diagnostic procedures: moderate Management options: moderate  Patient Progress Patient progress: improved   Final Clinical Impression(s) / ED Diagnoses Final diagnoses:  Hypotension, unspecified hypotension type  Physical deconditioning    Rx / DC Orders ED Discharge  Orders         [facility]        01/25/21 2114    Face-to-face encounter (required for Medicare/Medicaid patients)       Comments: [facility] certify that this patient is under my care and that I, or a nurse practitioner or physician's assistant working with me, had a face-to-face encounter that meets the physician face-to-face encounter requirements with this patient on 01/25/2021. The encounter with the patient was in whole, or in part for the following medical condition(s) which is the primary reason for home health care (List medical condition): all further questions can go to Dr. Jenny Reichmann her pcp   01/25/21 2114           Blanchie Dessert, MD 01/25/21 2116

## 2021-01-25 NOTE — ED Notes (Signed)
Unable to collect urine MD aware.

## 2021-01-25 NOTE — Assessment & Plan Note (Signed)
Will likely need HH with PT

## 2021-01-25 NOTE — Patient Instructions (Signed)
Please go Now to [facility]  Do not take further amlodipine for now

## 2021-01-25 NOTE — Discharge Instructions (Signed)
Your blood counts have not changed today and your hemoglobin is still 8.  No evidence of blood clot today or problems with your heart.  Your fatigue, weakness and shortness of breath may be from deconditioning because you have been laying in bed.  You did break your pelvis but appears to be healing well.  Also your blood pressure medication may be dropping your blood pressure too low when you get up to move around.  Stop taking the amlodipine altogether and you can start by taking just half of your atenolol and then increase to a full tablet if your blood pressure is high.

## 2021-02-04 ENCOUNTER — Telehealth: Payer: Self-pay | Admitting: Internal Medicine

## 2021-02-04 NOTE — Telephone Encounter (Signed)
Difficult situation, to continue to monitor BP while on toprol, and make drink reasonable fluids every day, and consider being seen for any other worsening symptoms such as fever, chills, cough, urinating difficulty or other unusual symptoms

## 2021-02-04 NOTE — Telephone Encounter (Signed)
Seeking advice  Patient's daughter Levada Dy seeking advice for her mother. She states she has stopped the Amlodipine Reporting has very little energy  Seeking advice on improving energy level

## 2021-02-05 NOTE — Telephone Encounter (Signed)
Please to make rov in 1 wk

## 2021-02-05 NOTE — Telephone Encounter (Signed)
Patient's daughter has been notified and would like to know when patient should have bloodwork checked again.

## 2021-02-06 NOTE — Telephone Encounter (Signed)
Patient scheduled for 02/13/21

## 2021-02-13 ENCOUNTER — Encounter: Payer: Self-pay | Admitting: Internal Medicine

## 2021-02-13 ENCOUNTER — Other Ambulatory Visit (INDEPENDENT_AMBULATORY_CARE_PROVIDER_SITE_OTHER): Payer: Medicare PPO

## 2021-02-13 ENCOUNTER — Ambulatory Visit: Payer: Medicare PPO | Admitting: Internal Medicine

## 2021-02-13 ENCOUNTER — Other Ambulatory Visit: Payer: Self-pay

## 2021-02-13 VITALS — BP 122/60 | HR 94 | Temp 98.4°F | Ht 65.0 in | Wt 92.0 lb

## 2021-02-13 DIAGNOSIS — E559 Vitamin D deficiency, unspecified: Secondary | ICD-10-CM

## 2021-02-13 DIAGNOSIS — E538 Deficiency of other specified B group vitamins: Secondary | ICD-10-CM | POA: Diagnosis not present

## 2021-02-13 DIAGNOSIS — I10 Essential (primary) hypertension: Secondary | ICD-10-CM | POA: Diagnosis not present

## 2021-02-13 DIAGNOSIS — E611 Iron deficiency: Secondary | ICD-10-CM

## 2021-02-13 DIAGNOSIS — R739 Hyperglycemia, unspecified: Secondary | ICD-10-CM | POA: Diagnosis not present

## 2021-02-13 DIAGNOSIS — E039 Hypothyroidism, unspecified: Secondary | ICD-10-CM

## 2021-02-13 DIAGNOSIS — S329XXD Fracture of unspecified parts of lumbosacral spine and pelvis, subsequent encounter for fracture with routine healing: Secondary | ICD-10-CM | POA: Diagnosis not present

## 2021-02-13 DIAGNOSIS — D649 Anemia, unspecified: Secondary | ICD-10-CM

## 2021-02-13 DIAGNOSIS — F419 Anxiety disorder, unspecified: Secondary | ICD-10-CM | POA: Insufficient documentation

## 2021-02-13 NOTE — Progress Notes (Signed)
Patient ID: Vanessa Clayton, female   DOB: [birth year], 87 y.o.   MRN: [id number]        Chief Complaint: follow up recent hypotension on multiple BP meds, mild low volume, ongoing anemia, and recent superior and inferior pubic rami fractures on the right       HPI:  Vanessa Clayton is a 87 y.o. female here with c/o above , seen and tx in ED may 27, BP improved readily with IVF, Hgb relatively stable, and felt safe for d/c with HH ordered, amlodipine stopped, and also taking HALF atenolol per daughter.  Pain has has somewhat improved, ambulatory some improved, and po intake including fluids better, and starting [facility] soon.  No overt bleeding.  Pt denies chest pain, increased sob or doe, wheezing, orthopnea, PND, increased LE swelling, palpitations, or syncope.   Pt denies polydipsia, polyuria, or new focal neuro s/s.  No other new complaints          Wt Readings from Last 3 Encounters:  02/13/21 92 lb (41.7 kg)  01/25/21 91 lb (41.3 kg)  07/24/20 105 lb (47.6 kg)   BP Readings from Last 3 Encounters:  02/13/21 122/60  01/25/21 139/75  01/25/21 (!) 72/48         Past Medical History:  Diagnosis Date   Allergy    Anemia 07/19/2018   Asymptomatic PVCs 12/09/2012   Benign hypertensive heart disease without heart failure 12/04/2010   Bilateral hearing loss 06/15/2019   Breast cancer ([place])    left   Chronic anemia    Chronic anxiety    Constipation 01/10/2020   Depression 1/0/2725   Diastolic dysfunction 3/66/4403   Dyspnea on exertion 06/17/2013   Essential hypertension 12/21/2015   History of TIA (transient ischemic attack) 2017, 2020   HLD (hyperlipidemia) 12/21/2015   HTN (hypertension)    Hyperglycemia 06/15/2019   Hyperthyroidism    Hypothyroidism    following treatment for hyperthyroidism   Medicare annual wellness visit, subsequent 03/12/2016   Orthostatic hypotension 03/26/2015   Peripheral neuropathy 07/19/2018   Protein-calorie malnutrition, severe ([place]) 05/09/2015   Sinus  tachycardia 04/09/2015   Syncope and collapse    TIA (transient ischemic attack) 05/07/2015   Vitamin D deficiency    Weight loss, non-intentional 12/09/2012   Past Surgical History:  Procedure Laterality Date   BREAST LUMPECTOMY  1998   left with radiation   CHOLECYSTECTOMY     COLON SURGERY     KNEE ARTHROSCOPY  10/15/05   TONSILLECTOMY      reports that she has never smoked. She has never used smokeless tobacco. She reports that she does not drink alcohol and does not use drugs. family history includes Hypertension in her brother and mother. Allergies  Allergen Reactions   Penicillins Other (See Comments) and Itching    Rash    Current Outpatient Medications on File Prior to Visit  Medication Sig Dispense Refill   acetaminophen (TYLENOL) 500 MG tablet Take 500 mg by mouth every 6 (six) hours as needed for moderate pain.      clopidogrel (PLAVIX) 75 MG tablet TAKE 1 TABLET(75 MG) BY MOUTH DAILY 90 tablet 3   escitalopram (LEXAPRO) 10 MG tablet Take 1 tablet (10 mg total) by mouth daily. 90 tablet 3   Ferrous Sulfate (SLOW FE PO) Take 1 tablet by mouth daily.     hydrocortisone (ANUSOL-HC) 2.5 % rectal cream Place 1 application rectally 2 (two) times daily. 30 g 1   levothyroxine (SYNTHROID)  75 MCG tablet Take 1 tablet (75 mcg total) by mouth daily before breakfast. 90 tablet 3   polyethylene glycol powder (GLYCOLAX/MIRALAX) 17 GM/SCOOP powder Take 1 dose once a day as needed for constipation     pravastatin (PRAVACHOL) 40 MG tablet TAKE 1 TABLET BY MOUTH DAILY 90 tablet 3   No current facility-administered medications on file prior to visit.        ROS:  All others reviewed and negative.  Objective        PE:  BP 122/60 (BP Location: Left Arm, Patient Position: Sitting, Cuff Size: Normal)   Pulse 94   Temp 98.4 F (36.9 C) (Oral)   Ht 5\' 5"  (1.651 m)   Wt 92 lb (41.7 kg)   SpO2 97%   BMI 15.31 kg/m                 Constitutional: Pt appears in NAD               HENT:  Head: NCAT.                Right Ear: External ear normal.                 Left Ear: External ear normal.                Eyes: . Pupils are equal, round, and reactive to light. Conjunctivae and EOM are normal               Nose: without d/c or deformity               Neck: Neck supple. Gross normal ROM               Cardiovascular: Normal rate and regular rhythm.                 Pulmonary/Chest: Effort normal and breath sounds without rales or wheezing.                Abd:  Soft, NT, ND, + BS, no organomegaly               Neurological: Pt is alert. At baseline orientation, motor grossly intact               Skin: Skin is warm. No rashes, no other new lesions, LE edema - none               Psychiatric: Pt behavior is normal without agitation   Micro: none  Cardiac tracings I have personally interpreted today:  none  Pertinent Radiological findings (summarize): none   Lab Results  Component Value Date   WBC 6.9 02/13/2021   HGB 7.8 Repeated and verified X2. (LL) 02/13/2021   HCT 24.6 (L) 02/13/2021   PLT 301.0 02/13/2021   GLUCOSE 88 02/13/2021   CHOL 111 02/13/2021   TRIG 75.0 02/13/2021   HDL 41.30 02/13/2021   LDLCALC 55 02/13/2021   ALT 5 02/13/2021   AST 13 02/13/2021   NA 135 02/13/2021   K 4.6 02/13/2021   CL 103 02/13/2021   CREATININE 1.05 02/13/2021   BUN 32 (H) 02/13/2021   CO2 24 02/13/2021   TSH 1.79 02/13/2021   INR 1.0 10/26/2018   HGBA1C 5.5 02/13/2021   Assessment/Plan:  Vanessa Clayton is a 87 y.o. Black or African American [2] female with  has a past medical history of Allergy, Anemia (07/19/2018), Asymptomatic PVCs (12/09/2012), Benign hypertensive heart disease without  heart failure (12/04/2010), Bilateral hearing loss (06/15/2019), Breast cancer ([place]), Chronic anemia, Chronic anxiety, Constipation (01/10/2020), Depression (1/0/2725), Diastolic dysfunction (3/66/4403), Dyspnea on exertion (06/17/2013), Essential hypertension (12/21/2015), History of TIA  (transient ischemic attack) (2017, 2020), HLD (hyperlipidemia) (12/21/2015), HTN (hypertension), Hyperglycemia (06/15/2019), Hyperthyroidism, Hypothyroidism, Medicare annual wellness visit, subsequent (03/12/2016), Orthostatic hypotension (03/26/2015), Peripheral neuropathy (07/19/2018), Protein-calorie malnutrition, severe ([place]) (05/09/2015), Sinus tachycardia (04/09/2015), Syncope and collapse, TIA (transient ischemic attack) (05/07/2015), Vitamin D deficiency, and Weight loss, non-intentional (12/09/2012).  Essential hypertension Overall much improved with recent fluids, stopped amlodipine and taking HALF  of the 25 mg atenolol per daughter; ok to continue this regimen, and encouraged cont'd better po intake, and current med regimen  Hyperglycemia Lab Results  Component Value Date   HGBA1C 5.5 02/13/2021   Stable, pt to continue current medical treatment - diet   Anemia No overt bleeding, but mod to severe chronic, relatively stable, but suspect will need closer near term monitoring, for cbc with labs   Vitamin D deficiency Last vitamin D Lab Results  Component Value Date   VD25OH 62.57 02/13/2021   Stable, cont oral replacement   Pelvic fracture (HCC) No further falls, pt To continue HH PT, and pt declines need for further pain control at this time,  to f/u any worsening symptoms or concerns  Followup: No follow-ups on file.  Cathlean Cower, MD 02/17/2021 8:24 AM [facility]

## 2021-02-13 NOTE — Patient Instructions (Signed)
Please continue all other medications as before, and refills have been done if requested.  Please have the pharmacy call with any other refills you may need.  Please continue your efforts at being more active, low cholesterol diet, and weight control.  Please keep your appointments with your specialists as you may have planned  You will be contacted regarding the referral for: [place] with Physical Therapy  Please go to the LAB at the blood drawing area for the tests to be done - at the [facility] at your convenience (no appt needed)  You will be contacted by phone if any changes need to be made immediately.  Otherwise, you will receive a letter about your results with an explanation, but please check with MyChart first.  Please make an Appointment to return in 3 months

## 2021-02-14 ENCOUNTER — Telehealth: Payer: Self-pay

## 2021-02-14 LAB — IBC PANEL
Iron: 37 ug/dL — ABNORMAL LOW (ref 42–145)
Saturation Ratios: 14.6 % — ABNORMAL LOW (ref 20.0–50.0)
Transferrin: 181 mg/dL — ABNORMAL LOW (ref 212.0–360.0)

## 2021-02-14 LAB — LIPID PANEL
Cholesterol: 111 mg/dL (ref 0–200)
HDL: 41.3 mg/dL (ref >39.00)
LDL Cholesterol: 55 mg/dL (ref 0–99)
NonHDL: 69.8
Total CHOL/HDL Ratio: 3
Triglycerides: 75 mg/dL (ref 0.0–149.0)
VLDL: 15 mg/dL (ref 0.0–40.0)

## 2021-02-14 LAB — CBC WITH DIFFERENTIAL/PLATELET
Basophils Absolute: 0 10*3/uL (ref 0.0–0.1)
Basophils Relative: 0.2 % (ref 0.0–3.0)
Eosinophils Absolute: 0.1 10*3/uL (ref 0.0–0.7)
Eosinophils Relative: 0.9 % (ref 0.0–5.0)
HCT: 24.6 % — ABNORMAL LOW (ref 36.0–46.0)
Hemoglobin: 7.8 g/dL — CL (ref 12.0–15.0)
Lymphocytes Relative: 36.2 % (ref 12.0–46.0)
Lymphs Abs: 2.5 10*3/uL (ref 0.7–4.0)
MCHC: 31.6 g/dL (ref 30.0–36.0)
MCV: 77.7 fl — ABNORMAL LOW (ref 78.0–100.0)
Monocytes Absolute: 0.5 10*3/uL (ref 0.1–1.0)
Monocytes Relative: 7.8 % (ref 3.0–12.0)
Neutro Abs: 3.8 10*3/uL (ref 1.4–7.7)
Neutrophils Relative %: 54.9 % (ref 43.0–77.0)
Platelets: 301 10*3/uL (ref 150.0–400.0)
RBC: 3.17 Mil/uL — ABNORMAL LOW (ref 3.87–5.11)
RDW: 15.1 % (ref 11.5–15.5)
WBC: 6.9 10*3/uL (ref 4.0–10.5)

## 2021-02-14 LAB — BASIC METABOLIC PANEL
BUN: 32 mg/dL — ABNORMAL HIGH (ref 6–23)
CO2: 24 mEq/L (ref 19–32)
Calcium: 8.9 mg/dL (ref 8.4–10.5)
Chloride: 103 mEq/L (ref 96–112)
Creatinine, Ser: 1.05 mg/dL (ref 0.40–1.20)
GFR: 46.07 mL/min — ABNORMAL LOW (ref >60.00)
Glucose, Bld: 88 mg/dL (ref 70–99)
Potassium: 4.6 mEq/L (ref 3.5–5.1)
Sodium: 135 mEq/L (ref 135–145)

## 2021-02-14 LAB — HEMOGLOBIN A1C: Hgb A1c MFr Bld: 5.5 % (ref 4.6–6.5)

## 2021-02-14 LAB — HEPATIC FUNCTION PANEL
ALT: 5 U/L (ref 0–35)
AST: 13 U/L (ref 0–37)
Albumin: 3.4 g/dL — ABNORMAL LOW (ref 3.5–5.2)
Alkaline Phosphatase: 64 U/L (ref 39–117)
Bilirubin, Direct: 0 mg/dL (ref 0.0–0.3)
Total Bilirubin: 0.2 mg/dL (ref 0.2–1.2)
Total Protein: 7 g/dL (ref 6.0–8.3)

## 2021-02-14 LAB — T4, FREE: Free T4: 1 ng/dL (ref 0.60–1.60)

## 2021-02-14 LAB — VITAMIN B12: Vitamin B-12: 298 pg/mL (ref 211–911)

## 2021-02-14 LAB — VITAMIN D 25 HYDROXY (VIT D DEFICIENCY, FRACTURES): VITD: 62.57 ng/mL (ref 30.00–100.00)

## 2021-02-14 LAB — FERRITIN: Ferritin: 237.7 ng/mL (ref 10.0–291.0)

## 2021-02-14 LAB — TSH: TSH: 1.79 u[IU]/mL (ref 0.35–4.50)

## 2021-02-14 NOTE — Telephone Encounter (Addendum)
Lab calling to report a critical hemoglobin for patient. Level is 7.8, please advise   CRITICAL VALUE STICKER  CRITICAL VALUE: 7.8  RECEIVER (on-site recipient of call): Lovena Le A. Alroy Dust, [place] NOTIFIED: 10:20a  MESSENGER (representative from lab): Santiago Glad  MD NOTIFIED: yes, Dr. Jenny Reichmann  TIME OF NOTIFICATION: 10:20a  RESPONSE: waiting for response

## 2021-02-15 ENCOUNTER — Other Ambulatory Visit (INDEPENDENT_AMBULATORY_CARE_PROVIDER_SITE_OTHER): Payer: Medicare PPO

## 2021-02-15 ENCOUNTER — Encounter: Payer: Self-pay | Admitting: Internal Medicine

## 2021-02-15 DIAGNOSIS — I10 Essential (primary) hypertension: Secondary | ICD-10-CM | POA: Diagnosis not present

## 2021-02-15 LAB — URINALYSIS, ROUTINE W REFLEX MICROSCOPIC
Bilirubin Urine: NEGATIVE
Hgb urine dipstick: NEGATIVE
Ketones, ur: NEGATIVE
Leukocytes,Ua: NEGATIVE
Nitrite: NEGATIVE
Specific Gravity, Urine: <=1.005 — AB (ref 1.000–1.030)
Total Protein, Urine: NEGATIVE
Urine Glucose: NEGATIVE
Urobilinogen, UA: 0.2 (ref 0.0–1.0)
pH: 6 (ref 5.0–8.0)

## 2021-02-15 NOTE — Addendum Note (Signed)
Addended by: Octavio Manns E on: 02/15/2021 05:20 PM   Modules accepted: Orders

## 2021-02-17 ENCOUNTER — Encounter: Payer: Self-pay | Admitting: Internal Medicine

## 2021-02-17 DIAGNOSIS — S329XXA Fracture of unspecified parts of lumbosacral spine and pelvis, initial encounter for closed fracture: Secondary | ICD-10-CM | POA: Insufficient documentation

## 2021-02-17 MED ORDER — ATENOLOL 25 MG PO TABS: 12.5000 mg | ORAL_TABLET | Freq: Every day | ORAL | 3 refills | Status: DC

## 2021-02-17 NOTE — Assessment & Plan Note (Signed)
Lab Results  Component Value Date   HGBA1C 5.5 02/13/2021   Stable, pt to continue current medical treatment - diet

## 2021-02-17 NOTE — Assessment & Plan Note (Signed)
Overall much improved with recent fluids, stopped amlodipine and taking HALF  of the 25 mg atenolol per daughter; ok to continue this regimen, and encouraged cont'd better po intake, and current med regimen

## 2021-02-17 NOTE — Assessment & Plan Note (Signed)
No overt bleeding, but mod to severe chronic, relatively stable, but suspect will need closer near term monitoring, for cbc with labs

## 2021-02-17 NOTE — Assessment & Plan Note (Signed)
No further falls, pt To continue HH PT, and pt declines need for further pain control at this time,  to f/u any worsening symptoms or concerns

## 2021-02-17 NOTE — Assessment & Plan Note (Signed)
Last vitamin D Lab Results  Component Value Date   VD25OH 62.57 02/13/2021   Stable, cont oral replacement

## 2021-03-18 ENCOUNTER — Ambulatory Visit: Payer: Medicare PPO | Admitting: Internal Medicine

## 2021-04-05 DIAGNOSIS — E039 Hypothyroidism, unspecified: Secondary | ICD-10-CM | POA: Diagnosis not present

## 2021-04-05 DIAGNOSIS — I10 Essential (primary) hypertension: Secondary | ICD-10-CM | POA: Diagnosis not present

## 2021-04-05 DIAGNOSIS — D649 Anemia, unspecified: Secondary | ICD-10-CM | POA: Diagnosis not present

## 2021-04-05 DIAGNOSIS — R7301 Impaired fasting glucose: Secondary | ICD-10-CM | POA: Diagnosis not present

## 2021-04-08 ENCOUNTER — Ambulatory Visit: Payer: Medicare PPO | Admitting: Interventional Cardiology

## 2021-04-09 DIAGNOSIS — E7800 Pure hypercholesterolemia, unspecified: Secondary | ICD-10-CM | POA: Diagnosis not present

## 2021-04-09 DIAGNOSIS — E890 Postprocedural hypothyroidism: Secondary | ICD-10-CM | POA: Diagnosis not present

## 2021-04-09 DIAGNOSIS — R7301 Impaired fasting glucose: Secondary | ICD-10-CM | POA: Diagnosis not present

## 2021-04-09 DIAGNOSIS — I129 Hypertensive chronic kidney disease with stage 1 through stage 4 chronic kidney disease, or unspecified chronic kidney disease: Secondary | ICD-10-CM | POA: Diagnosis not present

## 2021-04-09 DIAGNOSIS — D649 Anemia, unspecified: Secondary | ICD-10-CM | POA: Diagnosis not present

## 2021-04-09 DIAGNOSIS — R634 Abnormal weight loss: Secondary | ICD-10-CM | POA: Diagnosis not present

## 2021-04-09 DIAGNOSIS — E559 Vitamin D deficiency, unspecified: Secondary | ICD-10-CM | POA: Diagnosis not present

## 2021-04-09 DIAGNOSIS — N1831 Chronic kidney disease, stage 3a: Secondary | ICD-10-CM | POA: Diagnosis not present

## 2021-04-20 DIAGNOSIS — N1831 Chronic kidney disease, stage 3a: Secondary | ICD-10-CM | POA: Diagnosis present

## 2021-04-28 ENCOUNTER — Other Ambulatory Visit: Payer: Self-pay | Admitting: Internal Medicine

## 2021-04-28 DIAGNOSIS — I10 Essential (primary) hypertension: Secondary | ICD-10-CM

## 2021-05-30 NOTE — Progress Notes (Signed)
Cardiology Office Note    Date:  06/05/2021   ID:  Annely, Sliva [birth year], MRN [id number]   PCP:  Biagio Borg, MD   [place]  Cardiologist:  Larae Grooms, MD   Advanced Practice Provider:  No care team member to display Electrophysiologist:  None   [id number]   Chief Complaint  Patient presents with   Follow-up     History of Present Illness:  DEKOTA KIRLIN is a 87 y.o. female with history of HTN, HLD, thyrotoxicosis, chronic diastolic CHF, TIA 7616, echo normal LVEF, mod LVH no source emboli.  Last saw Dr. Irish Lack 01/19/20 and lost some weight but stable.  In ED 12/2020 with hypotension and prior fall improved with IV fluids.  Patient comes in with her granddaughter. Patient lives alone but they check on regularly. Not much of an appetite, not drinking enough water. No dizziness. Balance isn't good. Denies chest pain, dyspnea. No energy or get up and go.    Past Medical History:  Diagnosis Date   Allergy    Anemia 07/19/2018   Asymptomatic PVCs 12/09/2012   Benign hypertensive heart disease without heart failure 12/04/2010   Bilateral hearing loss 06/15/2019   Breast cancer ([place])    left   Chronic anemia    Chronic anxiety    Constipation 01/10/2020   Depression 0/03/3709   Diastolic dysfunction 02/25/9484   Dyspnea on exertion 06/17/2013   Essential hypertension 12/21/2015   History of TIA (transient ischemic attack) 2017, 2020   HLD (hyperlipidemia) 12/21/2015   HTN (hypertension)    Hyperglycemia 06/15/2019   Hyperthyroidism    Hypothyroidism    following treatment for hyperthyroidism   Medicare annual wellness visit, subsequent 03/12/2016   Orthostatic hypotension 03/26/2015   Peripheral neuropathy 07/19/2018   Protein-calorie malnutrition, severe ([place]) 05/09/2015   Sinus tachycardia 04/09/2015   Syncope and collapse    TIA (transient ischemic attack) 05/07/2015   Vitamin D deficiency    Weight loss, non-intentional  12/09/2012    Past Surgical History:  Procedure Laterality Date   BREAST LUMPECTOMY  1998   left with radiation   CHOLECYSTECTOMY     COLON SURGERY     KNEE ARTHROSCOPY  10/15/05   TONSILLECTOMY      Current Medications: Current Meds  Medication Sig   acetaminophen (TYLENOL) 500 MG tablet Take 500 mg by mouth every 6 (six) hours as needed for moderate pain.    atenolol (TENORMIN) 25 MG tablet Take 0.5 tablets (12.5 mg total) by mouth daily.   clopidogrel (PLAVIX) 75 MG tablet TAKE 1 TABLET(75 MG) BY MOUTH DAILY   escitalopram (LEXAPRO) 10 MG tablet Take 1 tablet (10 mg total) by mouth daily.   Ferrous Sulfate (SLOW FE PO) Take 1 tablet by mouth daily.   hydrocortisone (ANUSOL-HC) 2.5 % rectal cream Place 1 application rectally 2 (two) times daily.   levothyroxine (SYNTHROID) 75 MCG tablet Take 1 tablet (75 mcg total) by mouth daily before breakfast.   polyethylene glycol powder (GLYCOLAX/MIRALAX) 17 GM/SCOOP powder Take 1 dose once a day as needed for constipation   pravastatin (PRAVACHOL) 40 MG tablet TAKE 1 TABLET BY MOUTH DAILY     Allergies:   Penicillins   Social History   Socioeconomic History   Marital status: Widowed    Spouse name: Not on file   Number of children: 1   Years of education: 75   Highest education level: Not on file  Occupational History  Not on file  Tobacco Use   Smoking status: Never   Smokeless tobacco: Never  Vaping Use   Vaping Use: Never used  Substance and Sexual Activity   Alcohol use: No   Drug use: No   Sexual activity: Not on file  Other Topics Concern   Not on file  Social History Narrative   Fun: Travel    12/13/2018 lives alone, grandchildren go daily and helps her   Denies abuse and feels safe at home.    Social Determinants of Health   Financial Resource Strain: Not on file  Food Insecurity: Not on file  Transportation Needs: Not on file  Physical Activity: Not on file  Stress: Not on file  Social Connections: Not on  file     Family History:  The patient's  family history includes Hypertension in her brother and mother.   ROS:   Please see the history of present illness.    ROS All other systems reviewed and are negative.   PHYSICAL EXAM:   VS:  BP 124/60 (BP Location: Left Arm, Patient Position: Sitting, Cuff Size: Normal)   Pulse 86   Ht 5\' 5"  (1.651 m)   Wt 90 lb (40.8 kg)   SpO2 98%   BMI 14.98 kg/m   Physical Exam  GEN: Thin, elderly, in no acute distress  Neck: no JVD, carotid bruits, or masses Cardiac:RRR; 2/6 systolic murmur apex LSB  Respiratory:  clear to auscultation bilaterally, normal work of breathing GI: soft, nontender, nondistended, + BS Ext: without cyanosis, clubbing, or edema, Good distal pulses bilaterally Neuro:  Alert and Oriented x 3 Psych: euthymic mood, full affect  Wt Readings from Last 3 Encounters:  06/05/21 90 lb (40.8 kg)  02/13/21 92 lb (41.7 kg)  01/25/21 91 lb (41.3 kg)      Studies/Labs Reviewed:   EKG:  EKG is not ordered today.     Recent Labs: 01/25/2021: B Natriuretic Peptide 159.9 02/13/2021: ALT 5; BUN 32; Creatinine, Ser 1.05; Hemoglobin 7.8 Repeated and verified X2.; Platelets 301.0; Potassium 4.6; Sodium 135; TSH 1.79   Lipid Panel    Component Value Date/Time   CHOL 111 02/13/2021 1717   TRIG 75.0 02/13/2021 1717   HDL 41.30 02/13/2021 1717   CHOLHDL 3 02/13/2021 1717   VLDL 15.0 02/13/2021 1717   LDLCALC 55 02/13/2021 1717    Additional studies/ records that were reviewed today include:   Dopplers 10/2018 Summary:  Right Carotid: Velocities in the right ICA are consistent with a 1-39%  stenosis.   Left Carotid: Velocities in the left ICA are consistent with a 1-39%  stenosis.   Vertebrals: Bilateral vertebral arteries demonstrate antegrade flow.  Subclavians: Normal flow hemodynamics were seen in bilateral subclavian               arteries.   *See table(s) above for measurements and observations.    Echo  10/2018 IMPRESSIONS     1. The left ventricle has hyperdynamic systolic function, with an  ejection fraction of >65%. The cavity size was normal. There is moderately  increased left ventricular wall thickness. Left ventricular diastolic  Doppler parameters are consistent with  impaired relaxation.   2. The right ventricle has normal systolic function. The cavity was  normal. There is no increase in right ventricular wall thickness. Right  ventricular systolic pressure is moderately elevated with an estimated  pressure of 44.2 mmHg.   3. The mitral valve is normal in structure. Mild calcification of the  anterior mitral valve leaflet.   4. The tricuspid valve is normal in structure. Tricuspid valve  regurgitation is moderate-severe.   5. The aortic valve is tricuspid.   6. The pulmonic valve was normal in structure.   Risk Assessment/Calculations:         ASSESSMENT:    1. Benign hypertensive heart disease without heart failure   2. Mixed hyperlipidemia   3. Hx of transient ischemic attack (TIA)   4. Diastolic dysfunction   5. Failure to thrive in adult      PLAN:  In order of problems listed above:  HTN controlled on low dose tenormin-may need to stop completely in the future if she continues to lose weight.  HLD LDL 55 on pravachol 01/2021  History of TIA on Plavix-blood work in June all stable  Chronic diastolic CHF-compensated  Failure to thrive-weight 90 lbs. Appears dehydrated. Fall and hypotension in May. Encouraged boost/ensure, increase hydration  Shared Decision Making/Informed Consent        Medication Adjustments/Labs and Tests Ordered: Current medicines are reviewed at length with the patient today.  Concerns regarding medicines are outlined above.  Medication changes, Labs and Tests ordered today are listed in the Patient Instructions below. Patient Instructions  Medication Instructions:  Your physician recommends that you continue on your current  medications as directed. Please refer to the Current Medication list given to you today.  *If you need a refill on your cardiac medications before your next appointment, please call your pharmacy*   Lab Work: None If you have labs (blood work) drawn today and your tests are completely normal, you will receive your results only by: [place] (if you have MyChart) OR A paper copy in the mail If you have any lab test that is abnormal or we need to change your treatment, we will call you to review the results.  Follow-Up: At [facility], you and your health needs are our priority.  As part of our continuing mission to provide you with exceptional heart care, we have created designated Provider Care Teams.  These Care Teams include your primary Cardiologist (physician) and Advanced Practice Providers (APPs -  Physician Assistants and Nurse Practitioners) who all work together to provide you with the care you need, when you need it.  Your next appointment:   1 year(s)  The format for your next appointment:   In Person  Provider:   You may see Larae Grooms, MD or one of the following Advanced Practice Providers on your designated Care Team:   Melina Copa, PA-C Ermalinda Barrios, PA-C     Signed, Ermalinda Barrios, PA-C  06/05/2021 1:04 PM    [address], [place], [place]  [address] Phone: [phone]; Fax: [phone]

## 2021-06-05 ENCOUNTER — Encounter: Payer: Self-pay | Admitting: Physician Assistant

## 2021-06-05 ENCOUNTER — Other Ambulatory Visit: Payer: Self-pay

## 2021-06-05 ENCOUNTER — Ambulatory Visit: Payer: Medicare PPO | Admitting: Physician Assistant

## 2021-06-05 VITALS — BP 124/60 | HR 86 | Ht 65.0 in | Wt 90.0 lb

## 2021-06-05 DIAGNOSIS — I119 Hypertensive heart disease without heart failure: Secondary | ICD-10-CM | POA: Diagnosis not present

## 2021-06-05 DIAGNOSIS — R627 Adult failure to thrive: Secondary | ICD-10-CM

## 2021-06-05 DIAGNOSIS — I5189 Other ill-defined heart diseases: Secondary | ICD-10-CM | POA: Diagnosis not present

## 2021-06-05 DIAGNOSIS — Z8673 Personal history of transient ischemic attack (TIA), and cerebral infarction without residual deficits: Secondary | ICD-10-CM | POA: Diagnosis not present

## 2021-06-05 DIAGNOSIS — E782 Mixed hyperlipidemia: Secondary | ICD-10-CM

## 2021-06-05 NOTE — Patient Instructions (Signed)
Medication Instructions:  Your physician recommends that you continue on your current medications as directed. Please refer to the Current Medication list given to you today.  *If you need a refill on your cardiac medications before your next appointment, please call your pharmacy*   Lab Work: None If you have labs (blood work) drawn today and your tests are completely normal, you will receive your results only by: [place] (if you have MyChart) OR A paper copy in the mail If you have any lab test that is abnormal or we need to change your treatment, we will call you to review the results.  Follow-Up: At [facility], you and your health needs are our priority.  As part of our continuing mission to provide you with exceptional heart care, we have created designated Provider Care Teams.  These Care Teams include your primary Cardiologist (physician) and Advanced Practice Providers (APPs -  Physician Assistants and Nurse Practitioners) who all work together to provide you with the care you need, when you need it.  Your next appointment:   1 year(s)  The format for your next appointment:   In Person  Provider:   You may see Larae Grooms, MD or one of the following Advanced Practice Providers on your designated Care Team:   Melina Copa, PA-C Ermalinda Barrios, PA-C

## 2021-10-24 ENCOUNTER — Other Ambulatory Visit: Payer: Self-pay | Admitting: Interventional Cardiology

## 2021-12-19 ENCOUNTER — Telehealth: Payer: Self-pay | Admitting: Internal Medicine

## 2021-12-19 NOTE — Telephone Encounter (Signed)
N/A unable to leave amessage for patient to call back to schedule Medicare Annual Wellness Visit   Last AWV  01/10/20  Please schedule at anytime with [facility]-Nurse Health Advisor if patient calls the office back.     Any questions, please call me at [phone]  

## 2022-06-16 ENCOUNTER — Telehealth: Payer: Self-pay | Admitting: Internal Medicine

## 2022-06-16 NOTE — Telephone Encounter (Signed)
N/A unable to leave amessage for patient to call back to schedule Medicare Annual Wellness Visit   Last AWV  01/10/20  Please schedule at anytime with [facility] if patient calls the office back.     Any questions, please call me at [phone]

## 2022-07-18 ENCOUNTER — Inpatient Hospital Stay (HOSPITAL_COMMUNITY)
Admission: EM | Admit: 2022-07-18 | Discharge: 2022-08-01 | DRG: 080 | Disposition: E | Payer: Medicare PPO | Attending: Obstetrics and Gynecology | Admitting: Obstetrics and Gynecology

## 2022-07-18 ENCOUNTER — Emergency Department (HOSPITAL_COMMUNITY): Payer: Medicare PPO

## 2022-07-18 DIAGNOSIS — E035 Myxedema coma: Principal | ICD-10-CM | POA: Diagnosis present

## 2022-07-18 DIAGNOSIS — H9193 Unspecified hearing loss, bilateral: Secondary | ICD-10-CM | POA: Diagnosis present

## 2022-07-18 DIAGNOSIS — J90 Pleural effusion, not elsewhere classified: Secondary | ICD-10-CM | POA: Diagnosis not present

## 2022-07-18 DIAGNOSIS — E039 Hypothyroidism, unspecified: Secondary | ICD-10-CM | POA: Diagnosis present

## 2022-07-18 DIAGNOSIS — D649 Anemia, unspecified: Principal | ICD-10-CM

## 2022-07-18 DIAGNOSIS — F32A Depression, unspecified: Secondary | ICD-10-CM | POA: Diagnosis present

## 2022-07-18 DIAGNOSIS — E860 Dehydration: Secondary | ICD-10-CM | POA: Diagnosis not present

## 2022-07-18 DIAGNOSIS — I130 Hypertensive heart and chronic kidney disease with heart failure and stage 1 through stage 4 chronic kidney disease, or unspecified chronic kidney disease: Secondary | ICD-10-CM | POA: Diagnosis not present

## 2022-07-18 DIAGNOSIS — L89152 Pressure ulcer of sacral region, stage 2: Secondary | ICD-10-CM | POA: Diagnosis present

## 2022-07-18 DIAGNOSIS — Z515 Encounter for palliative care: Secondary | ICD-10-CM | POA: Diagnosis not present

## 2022-07-18 DIAGNOSIS — I5032 Chronic diastolic (congestive) heart failure: Secondary | ICD-10-CM | POA: Diagnosis present

## 2022-07-18 DIAGNOSIS — Z681 Body mass index (BMI) 19 or less, adult: Secondary | ICD-10-CM | POA: Diagnosis not present

## 2022-07-18 DIAGNOSIS — E559 Vitamin D deficiency, unspecified: Secondary | ICD-10-CM | POA: Diagnosis present

## 2022-07-18 DIAGNOSIS — R634 Abnormal weight loss: Secondary | ICD-10-CM | POA: Diagnosis not present

## 2022-07-18 DIAGNOSIS — E43 Unspecified severe protein-calorie malnutrition: Secondary | ICD-10-CM | POA: Diagnosis present

## 2022-07-18 DIAGNOSIS — R195 Other fecal abnormalities: Secondary | ICD-10-CM | POA: Diagnosis not present

## 2022-07-18 DIAGNOSIS — E874 Mixed disorder of acid-base balance: Secondary | ICD-10-CM | POA: Diagnosis not present

## 2022-07-18 DIAGNOSIS — R4182 Altered mental status, unspecified: Secondary | ICD-10-CM | POA: Diagnosis not present

## 2022-07-18 DIAGNOSIS — I951 Orthostatic hypotension: Secondary | ICD-10-CM | POA: Diagnosis not present

## 2022-07-18 DIAGNOSIS — I10 Essential (primary) hypertension: Secondary | ICD-10-CM | POA: Diagnosis present

## 2022-07-18 DIAGNOSIS — D696 Thrombocytopenia, unspecified: Secondary | ICD-10-CM | POA: Diagnosis present

## 2022-07-18 DIAGNOSIS — R531 Weakness: Secondary | ICD-10-CM | POA: Diagnosis not present

## 2022-07-18 DIAGNOSIS — R54 Age-related physical debility: Secondary | ICD-10-CM | POA: Diagnosis present

## 2022-07-18 DIAGNOSIS — D631 Anemia in chronic kidney disease: Secondary | ICD-10-CM | POA: Diagnosis present

## 2022-07-18 DIAGNOSIS — G9341 Metabolic encephalopathy: Secondary | ICD-10-CM | POA: Diagnosis present

## 2022-07-18 DIAGNOSIS — Z66 Do not resuscitate: Secondary | ICD-10-CM | POA: Diagnosis not present

## 2022-07-18 DIAGNOSIS — R64 Cachexia: Secondary | ICD-10-CM | POA: Diagnosis not present

## 2022-07-18 DIAGNOSIS — J690 Pneumonitis due to inhalation of food and vomit: Secondary | ICD-10-CM | POA: Diagnosis not present

## 2022-07-18 DIAGNOSIS — D509 Iron deficiency anemia, unspecified: Secondary | ICD-10-CM | POA: Diagnosis present

## 2022-07-18 DIAGNOSIS — N390 Urinary tract infection, site not specified: Secondary | ICD-10-CM | POA: Diagnosis present

## 2022-07-18 DIAGNOSIS — D62 Acute posthemorrhagic anemia: Secondary | ICD-10-CM | POA: Diagnosis present

## 2022-07-18 DIAGNOSIS — Z91148 Patient's other noncompliance with medication regimen for other reason: Secondary | ICD-10-CM

## 2022-07-18 DIAGNOSIS — E861 Hypovolemia: Secondary | ICD-10-CM | POA: Diagnosis present

## 2022-07-18 DIAGNOSIS — L8990 Pressure ulcer of unspecified site, unspecified stage: Secondary | ICD-10-CM | POA: Insufficient documentation

## 2022-07-18 DIAGNOSIS — R001 Bradycardia, unspecified: Secondary | ICD-10-CM | POA: Diagnosis not present

## 2022-07-18 DIAGNOSIS — N1832 Chronic kidney disease, stage 3b: Secondary | ICD-10-CM | POA: Diagnosis present

## 2022-07-18 DIAGNOSIS — N3001 Acute cystitis with hematuria: Secondary | ICD-10-CM | POA: Diagnosis not present

## 2022-07-18 DIAGNOSIS — J9601 Acute respiratory failure with hypoxia: Secondary | ICD-10-CM | POA: Diagnosis not present

## 2022-07-18 DIAGNOSIS — R509 Fever, unspecified: Secondary | ICD-10-CM | POA: Diagnosis not present

## 2022-07-18 DIAGNOSIS — N1831 Chronic kidney disease, stage 3a: Secondary | ICD-10-CM | POA: Diagnosis present

## 2022-07-18 DIAGNOSIS — R000 Tachycardia, unspecified: Secondary | ICD-10-CM | POA: Diagnosis not present

## 2022-07-18 DIAGNOSIS — R0902 Hypoxemia: Secondary | ICD-10-CM | POA: Diagnosis not present

## 2022-07-18 DIAGNOSIS — Z8249 Family history of ischemic heart disease and other diseases of the circulatory system: Secondary | ICD-10-CM

## 2022-07-18 DIAGNOSIS — E162 Hypoglycemia, unspecified: Secondary | ICD-10-CM | POA: Diagnosis not present

## 2022-07-18 DIAGNOSIS — Z8673 Personal history of transient ischemic attack (TIA), and cerebral infarction without residual deficits: Secondary | ICD-10-CM

## 2022-07-18 DIAGNOSIS — R627 Adult failure to thrive: Secondary | ICD-10-CM | POA: Diagnosis present

## 2022-07-18 DIAGNOSIS — E876 Hypokalemia: Secondary | ICD-10-CM | POA: Diagnosis not present

## 2022-07-18 DIAGNOSIS — R739 Hyperglycemia, unspecified: Secondary | ICD-10-CM | POA: Diagnosis not present

## 2022-07-18 DIAGNOSIS — I959 Hypotension, unspecified: Secondary | ICD-10-CM | POA: Diagnosis not present

## 2022-07-18 DIAGNOSIS — R680 Hypothermia, not associated with low environmental temperature: Secondary | ICD-10-CM | POA: Diagnosis present

## 2022-07-18 DIAGNOSIS — E7800 Pure hypercholesterolemia, unspecified: Secondary | ICD-10-CM | POA: Diagnosis present

## 2022-07-18 LAB — URINALYSIS, ROUTINE W REFLEX MICROSCOPIC
Bilirubin Urine: NEGATIVE
Glucose, UA: NEGATIVE mg/dL
Ketones, ur: NEGATIVE mg/dL
Nitrite: POSITIVE — AB
Protein, ur: 100 mg/dL — AB
RBC / HPF: >50 RBC/hpf — ABNORMAL HIGH (ref 0–5)
Specific Gravity, Urine: 1.01 (ref 1.005–1.030)
WBC, UA: >50 WBC/hpf — ABNORMAL HIGH (ref 0–5)
pH: 6 (ref 5.0–8.0)

## 2022-07-18 LAB — LACTIC ACID, PLASMA
Lactic Acid, Venous: 2.7 mmol/L (ref 0.5–1.9)
Lactic Acid, Venous: 2 mmol/L (ref 0.5–1.9)

## 2022-07-18 LAB — COMPREHENSIVE METABOLIC PANEL
ALT: 15 U/L (ref 0–44)
AST: 67 U/L — ABNORMAL HIGH (ref 15–41)
Albumin: 2 g/dL — ABNORMAL LOW (ref 3.5–5.0)
Alkaline Phosphatase: 96 U/L (ref 38–126)
Anion gap: 12 (ref 5–15)
BUN: 20 mg/dL (ref 8–23)
CO2: 21 mmol/L — ABNORMAL LOW (ref 22–32)
Calcium: 8.5 mg/dL — ABNORMAL LOW (ref 8.9–10.3)
Chloride: 104 mmol/L (ref 98–111)
Creatinine, Ser: 1.11 mg/dL — ABNORMAL HIGH (ref 0.44–1.00)
GFR, Estimated: 46 mL/min — ABNORMAL LOW (ref >60)
Glucose, Bld: 73 mg/dL (ref 70–99)
Potassium: 4 mmol/L (ref 3.5–5.1)
Sodium: 137 mmol/L (ref 135–145)
Total Bilirubin: 0.8 mg/dL (ref 0.3–1.2)
Total Protein: 6.9 g/dL (ref 6.5–8.1)

## 2022-07-18 LAB — CBC WITH DIFFERENTIAL/PLATELET
Abs Immature Granulocytes: 0.05 10*3/uL (ref 0.00–0.07)
Basophils Absolute: 0 10*3/uL (ref 0.0–0.1)
Basophils Relative: 0 %
Eosinophils Absolute: 0 10*3/uL (ref 0.0–0.5)
Eosinophils Relative: 0 %
HCT: 16.2 % — ABNORMAL LOW (ref 36.0–46.0)
Hemoglobin: 5.1 g/dL — CL (ref 12.0–15.0)
Immature Granulocytes: 1 %
Lymphocytes Relative: 21 %
Lymphs Abs: 2.2 10*3/uL (ref 0.7–4.0)
MCH: 21.7 pg — ABNORMAL LOW (ref 26.0–34.0)
MCHC: 31.5 g/dL (ref 30.0–36.0)
MCV: 68.9 fL — ABNORMAL LOW (ref 80.0–100.0)
Monocytes Absolute: 0.6 10*3/uL (ref 0.1–1.0)
Monocytes Relative: 5 %
Neutro Abs: 7.8 10*3/uL — ABNORMAL HIGH (ref 1.7–7.7)
Neutrophils Relative %: 73 %
Platelets: 95 10*3/uL — ABNORMAL LOW (ref 150–400)
RBC: 2.35 MIL/uL — ABNORMAL LOW (ref 3.87–5.11)
RDW: 19.5 % — ABNORMAL HIGH (ref 11.5–15.5)
WBC: 10.6 10*3/uL — ABNORMAL HIGH (ref 4.0–10.5)
nRBC: 0.2 % (ref 0.0–0.2)

## 2022-07-18 LAB — RAPID URINE DRUG SCREEN, HOSP PERFORMED
Amphetamines: NOT DETECTED
Barbiturates: NOT DETECTED
Benzodiazepines: NOT DETECTED
Cocaine: NOT DETECTED
Opiates: NOT DETECTED
Tetrahydrocannabinol: NOT DETECTED

## 2022-07-18 LAB — TSH: TSH: 70.637 u[IU]/mL — ABNORMAL HIGH (ref 0.350–4.500)

## 2022-07-18 LAB — APTT: aPTT: 39 seconds — ABNORMAL HIGH (ref 24–36)

## 2022-07-18 LAB — POC OCCULT BLOOD, ED: Fecal Occult Bld: POSITIVE — AB

## 2022-07-18 LAB — PREPARE RBC (CROSSMATCH)

## 2022-07-18 LAB — PROTIME-INR
INR: 1.1 (ref 0.8–1.2)
Prothrombin Time: 13.6 seconds (ref 11.4–15.2)

## 2022-07-18 LAB — MAGNESIUM: Magnesium: 1.9 mg/dL (ref 1.7–2.4)

## 2022-07-18 LAB — T4, FREE: Free T4: 0.44 ng/dL — ABNORMAL LOW (ref 0.61–1.12)

## 2022-07-18 MED ORDER — PANTOPRAZOLE SODIUM 40 MG IV SOLR
40.0000 mg | Freq: Once | INTRAVENOUS | Status: AC
  Administered 2022-07-18: 40 mg via INTRAVENOUS
  Filled 2022-07-18: qty 10

## 2022-07-18 MED ORDER — ACETAMINOPHEN 650 MG RE SUPP: 650.0000 mg | RECTAL | Status: DC | PRN

## 2022-07-18 MED ORDER — LACTATED RINGERS IV BOLUS
1000.0000 mL | Freq: Once | INTRAVENOUS | Status: AC
  Administered 2022-07-18: 1000 mL via INTRAVENOUS

## 2022-07-18 MED ORDER — POLYETHYLENE GLYCOL 3350 17 G PO PACK: 17.0000 g | PACK | Freq: Every day | ORAL | Status: DC | PRN

## 2022-07-18 MED ORDER — DEXTROSE-NACL 5-0.9 % IV SOLN: INTRAVENOUS | Status: AC

## 2022-07-18 MED ORDER — ENOXAPARIN SODIUM 30 MG/0.3ML IJ SOSY
30.0000 mg | PREFILLED_SYRINGE | Freq: Every day | INTRAMUSCULAR | Status: DC
  Administered 2022-07-19 – 2022-07-23 (×5): 30 mg via SUBCUTANEOUS
  Filled 2022-07-18 (×5): qty 0.3

## 2022-07-18 MED ORDER — MELATONIN 5 MG PO TABS
5.0000 mg | ORAL_TABLET | Freq: Every evening | ORAL | Status: DC | PRN
  Administered 2022-07-22: 5 mg via ORAL
  Filled 2022-07-18: qty 1

## 2022-07-18 MED ORDER — SODIUM CHLORIDE 0.9 % IV SOLN
2.0000 g | Freq: Once | INTRAVENOUS | Status: AC
  Administered 2022-07-18: 2 g via INTRAVENOUS
  Filled 2022-07-18: qty 20

## 2022-07-18 MED ORDER — SODIUM CHLORIDE 0.9 % IV SOLN
2.0000 g | INTRAVENOUS | Status: DC
  Administered 2022-07-19 – 2022-07-21 (×3): 2 g via INTRAVENOUS
  Filled 2022-07-18 (×3): qty 20

## 2022-07-18 MED ORDER — LACTATED RINGERS IV BOLUS (SEPSIS)
1000.0000 mL | Freq: Once | INTRAVENOUS | Status: AC
  Administered 2022-07-18: 1000 mL via INTRAVENOUS

## 2022-07-18 MED ORDER — PROCHLORPERAZINE EDISYLATE 10 MG/2ML IJ SOLN: 5.0000 mg | Freq: Four times a day (QID) | INTRAMUSCULAR | Status: DC | PRN

## 2022-07-18 MED ORDER — SODIUM CHLORIDE 0.9 % IV SOLN: 10.0000 mL/h | Freq: Once | INTRAVENOUS | Status: DC

## 2022-07-18 MED ORDER — ACETAMINOPHEN 325 MG PO TABS: 650.0000 mg | ORAL_TABLET | Freq: Four times a day (QID) | ORAL | Status: DC | PRN

## 2022-07-18 NOTE — ED Provider Notes (Signed)
[facility] EMERGENCY DEPARTMENT Provider Note   CSN: [id number] Arrival date & time: 07/14/2022  1810     History  Chief Complaint  Patient presents with   Altered Mental Status    Vanessa Clayton is a 88 year old female with a history of HTN, HLD, thyrotoxicosis, chronic diastolic CHF, TIA 3716, echo normal LVEF, mod LVH, Hyperglycemia who presents to the emergency department for altered mental status and poor p.o. intake.  Per EMS, family reported that at baseline her GCS is 14 but she has been altered for the last 2 days, and not eating in 2 days as well.  Patient unable to provide further history. Per daughter, who I spoke with over the phone, the patient has been very sleepy and "in and out" in terms of trying to talk. Today she seemed even moe groggy and confused, and her husband said he would not even acknowledge him today which is abnormal.  The history is provided by the patient, the EMS personnel and a relative.  Altered Mental Status      Home Medications Prior to Admission medications   Medication Sig Start Date End Date Taking? Authorizing Provider  acetaminophen (TYLENOL) 500 MG tablet Take 500 mg by mouth every 8 (eight) hours as needed for moderate pain.   Yes [provider]  atenolol (TENORMIN) 25 MG tablet TAKE 1 TABLET(25 MG) BY MOUTH DAILY Patient taking differently: Take 25 mg by mouth daily. 10/24/21  Yes Jettie Booze, MD  clopidogrel (PLAVIX) 75 MG tablet TAKE 1 TABLET(75 MG) BY MOUTH DAILY Patient taking differently: Take 75 mg by mouth once. 06/15/19  Yes Biagio Borg, MD  Cyanocobalamin (VITAMIN B12 PO) Take 1 tablet by mouth daily.   Yes [provider]  escitalopram (LEXAPRO) 10 MG tablet Take 1 tablet (10 mg total) by mouth daily. 06/15/19  Yes Biagio Borg, MD  polyethylene glycol powder [facility]) 17 GM/SCOOP powder Take 17 g by mouth daily as needed for moderate constipation.   Yes [provider]  pravastatin (PRAVACHOL) 40 MG tablet TAKE 1 TABLET BY MOUTH DAILY Patient taking differently: Take 40 mg by mouth daily. 04/28/21  Yes Biagio Borg, MD      Allergies    Penicillins    Review of Systems   Review of Systems Unable to obtain.   Physical Exam Updated Vital Signs BP 138/74   Pulse 93   Temp (!) 93.9 F (34.4 C) (Oral)   Resp (!) 22   Physical Exam Vitals and nursing note reviewed.  Constitutional:      Appearance: She is ill-appearing.     Comments: Thin.   HENT:     Head: Normocephalic and atraumatic.  Eyes:     Pupils: Pupils are equal, round, and reactive to light.  Cardiovascular:     Rate and Rhythm: Regular rhythm. Tachycardia present.  Pulmonary:     Effort: Pulmonary effort is normal.  Abdominal:     Palpations: Abdomen is soft.  Musculoskeletal:     Cervical back: No rigidity.  Skin:    General: Skin is warm and dry.     Capillary Refill: Capillary refill takes less than 2 seconds.     Comments: No sacral wounds.   Neurological:     Mental Status: She is alert.     Comments: Alert. Appears confused, moving all 4 extremities spontaneously. Not answering questions or  following commands.      ED Results / Procedures /  Treatments   Labs (all labs ordered are listed, but only abnormal results are displayed) Labs Reviewed  CBC WITH DIFFERENTIAL/PLATELET - Abnormal; Notable for the following components:      Result Value   WBC 10.6 (*)    RBC 2.35 (*)    Hemoglobin 5.1 (*)    HCT 16.2 (*)    MCV 68.9 (*)    MCH 21.7 (*)    RDW 19.5 (*)    Platelets 95 (*)    Neutro Abs 7.8 (*)    All other components within normal limits  COMPREHENSIVE METABOLIC PANEL - Abnormal; Notable for the following components:   CO2 21 (*)    Creatinine, Ser 1.11 (*)    Calcium 8.5 (*)    Albumin 2.0 (*)    AST 67 (*)    GFR, Estimated 46 (*)    All other components within normal limits  TSH - Abnormal; Notable for the following components:    TSH 70.637 (*)    All other components within normal limits  T4, FREE - Abnormal; Notable for the following components:   Free T4 0.44 (*)    All other components within normal limits  URINALYSIS, ROUTINE W REFLEX MICROSCOPIC - Abnormal; Notable for the following components:   APPearance TURBID (*)    Hgb urine dipstick MODERATE (*)    Protein, ur 100 (*)    Nitrite POSITIVE (*)    Leukocytes,Ua LARGE (*)    RBC / HPF >50 (*)    WBC, UA >50 (*)    Bacteria, UA MANY (*)    All other components within normal limits  LACTIC ACID, PLASMA - Abnormal; Notable for the following components:   Lactic Acid, Venous 2.7 (*)    All other components within normal limits  LACTIC ACID, PLASMA - Abnormal; Notable for the following components:   Lactic Acid, Venous 2.0 (*)    All other components within normal limits  APTT - Abnormal; Notable for the following components:   aPTT 39 (*)    All other components within normal limits  POC OCCULT BLOOD, ED - Abnormal; Notable for the following components:   Fecal Occult Bld POSITIVE (*)    All other components within normal limits  CULTURE, BLOOD (ROUTINE X 2)  CULTURE, BLOOD (ROUTINE X 2)  RESP PANEL BY RT-PCR (FLU A&B, COVID) ARPGX2  URINE CULTURE  RAPID URINE DRUG SCREEN, HOSP PERFORMED  MAGNESIUM  PROTIME-INR  CBC  CREATININE, SERUM  PREPARE RBC (CROSSMATCH)  TYPE AND SCREEN    EKG EKG Interpretation  Date/Time:  Friday July 18 2022 18:37:59 EST Ventricular Rate:  119 PR Interval:  140 QRS Duration: 79 QT Interval:  347 QTC Calculation: 487 R Axis:   92 Text Interpretation: Sinus tachycardia Atrial premature complex Consider right atrial enlargement Pattern suggests chronic pulmonary disease Abnormal lateral Q waves Confirmed by Octaviano Glow [phone]) on 07/04/2022 8:38:07 PM  Radiology CT Head Wo Contrast  Result Date: 07/13/2022 CLINICAL DATA:  Altered mental status EXAM: CT HEAD WITHOUT CONTRAST TECHNIQUE: Contiguous  axial images were obtained from the base of the skull through the vertex without intravenous contrast. RADIATION DOSE REDUCTION: This exam was performed according to the departmental dose-optimization program which includes automated exposure control, adjustment of the mA and/or kV according to patient size and/or use of iterative reconstruction technique. COMPARISON:  10/26/2018 FINDINGS: Brain: Normal anatomic configuration. Parenchymal volume loss is commensurate with the patient's age but appears slightly progressive since prior examination. Moderate periventricular  white matter changes are present likely reflecting the sequela of small vessel ischemia. Remote infarcts involving the left thalamus bifrontal periventricular white matter and medial left temporal cortex are again noted. No abnormal intra or extra-axial mass lesion or fluid collection. No abnormal mass effect or midline shift. No evidence of acute intracranial hemorrhage or infarct. Ventricular size is normal. Cerebellum unremarkable. Vascular: No asymmetric hyperdense vasculature at the skull base. Skull: Intact Sinuses/Orbits: Paranasal sinuses are clear. Orbits are unremarkable. Other: Mastoid air cells and middle ear cavities are clear. IMPRESSION: 1. No acute intracranial hemorrhage or infarct. 2. Moderate senescent change, slightly progressive since prior examination. 3. 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 None Electronically Signed   By: Fidela Salisbury M.D.   On: 07/05/2022 19:14   DG Chest Portable 1 View  Result Date: 07/03/2022 CLINICAL DATA:  Altered mental status EXAM: PORTABLE CHEST 1 VIEW COMPARISON:  01/25/2021 FINDINGS: No acute airspace disease or pleural effusion. Normal cardiac size. Probable pleural scarring at the right CP angle. Tortuous ectatic thoracic aortic knob likely augmented by patient rotation. Postsurgical changes of the left  breast and axilla IMPRESSION: No active disease. Electronically Signed   By: Donavan Foil M.D.   On: 07/08/2022 18:58    Procedures Procedures    Medications Ordered in ED Medications  0.9 %  sodium chloride infusion (has no administration in time range)  enoxaparin (LOVENOX) injection 30 mg (has no administration in time range)  dextrose 5 %-0.9 % sodium chloride infusion (has no administration in time range)  lactated ringers bolus 1,000 mL (0 mLs Intravenous Stopped 07/26/2022 2151)  lactated ringers bolus 1,000 mL (0 mLs Intravenous Stopped 07/30/2022 2151)  cefTRIAXone (ROCEPHIN) 2 g in sodium chloride 0.9 % 100 mL IVPB (0 g Intravenous Stopped 07/31/2022 2031)  pantoprazole (PROTONIX) injection 40 mg (40 mg Intravenous Given 07/13/2022 2242)    ED Course/ Medical Decision Making/ A&P Clinical Course as of 07/20/2022 2332  Fri Jul 18, 3234  457 88 year old female presenting from home with altered mental status.  Family reported the patient has been in decline for the past 2 days.  The patient is essentially nonverbal on arrival.  She is scratching near her groin.  She is thin, frail, cachectic, chronically ill-appearing.  Differential is broad and could include UTI versus other infection versus anemia versus CVA.  We have ordered CT scan of the head, we will perform an infection work-up and hydration work-up as well. [MT]  1937 UTI, anemia.  [DG]  [id number] Patient has acute anemia 5.1, lactate level elevated 2.7.  She is continuing with fluids, now concerning for severe sepsis or dehydration.  Resident physician is speaking to family on the phone and has obtained verbal consent for blood transfusion. [MT]  1943 Daughter Vanessa Clayton and son in law Vanessa Clayton confirmed code status of DNR full. (NO intubations or compressions but otherwise full scope of treatment).  [DG]  2143 Lactic Acid, Venous(!!): 2.0 [MT]  2143 Repeat lactate improved with fluids.  Patient maintained stable blood pressure  and heart rate in the 80s on my sepsis reassessment. [MT]    Clinical Course User Index [DG] Renard Matter, MD [MT] Wyvonnia Dusky, MD                           Medical Decision Making Problems Addressed: Acute cystitis with hematuria: acute illness or injury that poses a  threat to life or bodily functions Anemia, unspecified type: acute illness or injury that poses a threat to life or bodily functions Dehydration: acute illness or injury that poses a threat to life or bodily functions Weakness: acute illness or injury that poses a threat to life or bodily functions  Amount and/or Complexity of Data Reviewed Independent Historian: caregiver and EMS External Data Reviewed: labs and notes. Labs: ordered. Decision-making details documented in ED Course. Radiology: ordered and independent interpretation performed. Decision-making details documented in ED Course. ECG/medicine tests: ordered and independent interpretation performed. Decision-making details documented in ED Course. Discussion of management or test interpretation with external provider(s): Gastroenterology   Risk Prescription drug management. Decision regarding hospitalization.   AVEREY KONING is a 88 year old female with a history of HTN, HLD, thyrotoxicosis, chronic diastolic CHF, TIA 3300, echo normal LVEF, mod LVH, Hyperglycemia who presents to the emergency department for altered mental status and poor p.o. intake.   Patient initially hypothermic to 96.8 rectal, tachycardic to 118, tachypneic to 22, with soft blood pressures of 90s/70s decreased to 80s/60s. Patient initiated on 2L IVFs.   On exam patient is very thin appearing.  Not following commands and appears very confused, however moving all 4 extremities spontaneously without notable focal neurologic deficits.  Face is symmetric.  Patient is periodically grabbing at her groin, concern for possible UTI.  Given significant change in baseline mental status per  family, will also obtain head CT to evaluate for possible acute intracranial process such as CVA.  Will obtain broad laboratory work-up including thyroid studies as patient does have a history of thyroid disease.  No cough noted on exam and lungs sound clear, however will obtain chest x-ray to further evaluate for possible pneumonia.  Work-up: CBC with hemoglobin 5.1, mild leukocytosis with WBC 10.6.  Platelets 75.  CMP with creatinine 1.11, glucose 73, no other significant derangements.  Initial lactic acid elevated at 2.7, improved to 2.0 after IV fluids.  TSH elevated, free T4 slightly low at 0.44 though do not feel that this is the cause of her presentation today.  UDS negative.  UA with positive nitrates, large amount of leukocytes, greater than 50 WBCs, many bacteria, consistent with UTI.  Blood cultures x2 drawn and in process.  CT head on my independent review as well as review by radiology demonstrates no acute intracranial process.  Chest x-ray shows no evidence of pneumonia on my independent review.  EKG on my independent review demonstrates sinus tachycardia, no STEMI.  T wave flattening in the lateral leads.  Workup overall remarkable for UTI, anemia with hemoglobin 5.1.  DRE negative for gross blood, Hemoccult positive. Patient ordered for Rocephin, 2 units PRBCs in ED in addition to 2 L IV fluids previously administered.  Patient also ordered for IV Protonix given positive Hemoccult.  Suspect occult GI bleed as source of her anemia, possibly compounded by menstruation.  Patient ordered for IV Protonix.  I spoke with the patient's daughter, Vanessa Clayton as well as the patient's son-in-law, Vanessa Clayton regarding our work-up today and to discuss CODE STATUS.  They stated that the patient has been adamant throughout her life that she would not want intubation or compressions in the event of cardiopulmonary arrest, but otherwise would want full scope of treatment including IV fluids,  antibiotics, blood transfusion.  They provided consent for blood transfusion over the phone.  Patient ordered for 2 units in ED. on reassessment, blood pressure has remained stable and tachycardia has resolved.  Patient  noted to have low temperature, was given multiple warm blankets and initiated on warm IV fluids.  I spoke with Dr. Havery Moros of gastroenterology over the phone regarding concern for occult GI bleed.  He plans to see patient in the morning.  Patient ordered for Protonix.  Patient remained hemodynamically stable throughout the remainder of my care in the ED.  Patient admitted to hospitalist for further management.         Final Clinical Impression(s) / ED Diagnoses Final diagnoses:  Anemia, unspecified type  Weakness  Acute cystitis with hematuria  Dehydration    Rx / DC Orders ED Discharge Orders     None         Renard Matter, MD 07/26/2022 2332    Wyvonnia Dusky, MD 07/03/2022 2333

## 2022-07-18 NOTE — ED Provider Notes (Signed)
.  Critical Care  Performed by: Wyvonnia Dusky, MD Authorized by: Wyvonnia Dusky, MD   Critical care provider statement:    Critical care time (minutes):  45   Critical care time was exclusive of:  Separately billable procedures and treating other patients   Critical care was necessary to treat or prevent imminent or life-threatening deterioration of the following conditions:  Circulatory failure   Critical care was time spent personally by me on the following activities:  Ordering and performing treatments and interventions, ordering and review of laboratory studies, ordering and review of radiographic studies, pulse oximetry, review of old charts, examination of patient and evaluation of patient's response to treatment   Care discussed with: admitting provider   Comments:     Symptomatic anemia blood transfusion     Wyvonnia Dusky, MD 07/28/2022 2038

## 2022-07-18 NOTE — H&P (Addendum)
History and Physical  Vanessa Clayton HYI:[id number] DOB: May 19, 1926 DOA: 07/13/2022  Referring physician: Dr. Clarise Cruz, EDP-Resident Physician.  PCP: Biagio Borg, MD  Outpatient Specialists: None Patient coming from: Home  Chief Complaint: Poor oral intake, failure to thrive.  HPI: Vanessa Clayton is a 88 y.o. female with medical history significant for hypertension, hyperlipidemia, thyrotoxicosis, chronic diastolic CHF, TIA in 7867, unintentional weight loss, CKD 3B, who presented to [facility] ED from home due to altered mental status and significant decrease in oral intake, for the past 2 days.  In the ED, noted to be severely anemic with hemoglobin of 5.1, hypothermic 93.9, hypovolemic, positive FOBT.  EDP discussed the case with GI Dr. Havery Moros, GI [facility] will see in consultation in the morning.  UA was positive for pyuria and she was started on Rocephin empirically.  EDP requested admission for failure to thrive and presumptive UTI.  The patient was admitted by [facility], hospitalist service.  ED Course: Tmax 93.9.  BP 138/74, pulse 93, respiration rate 22.  Lab studies remarkable for hemoglobin 5.1, MCV 68.9.  Platelet count 95.  Creatinine 1.1 with GFR 46.  Review of Systems: Review of systems as noted in the HPI. All other systems reviewed and are negative.   Past Medical History:  Diagnosis Date   Allergy    Anemia 07/19/2018   Asymptomatic PVCs 12/09/2012   Benign hypertensive heart disease without heart failure 12/04/2010   Bilateral hearing loss 06/15/2019   Breast cancer ([place])    left   Chronic anemia    Chronic anxiety    Constipation 01/10/2020   Depression 02/05/2093   Diastolic dysfunction 03/09/6282   Dyspnea on exertion 06/17/2013   Essential hypertension 12/21/2015   History of TIA (transient ischemic attack) 2017, 2020   HLD (hyperlipidemia) 12/21/2015   HTN (hypertension)    Hyperglycemia 06/15/2019   Hyperthyroidism    Hypothyroidism    following treatment for  hyperthyroidism   Medicare annual wellness visit, subsequent 03/12/2016   Orthostatic hypotension 03/26/2015   Peripheral neuropathy 07/19/2018   Protein-calorie malnutrition, severe ([place]) 05/09/2015   Sinus tachycardia 04/09/2015   Syncope and collapse    TIA (transient ischemic attack) 05/07/2015   Vitamin D deficiency    Weight loss, non-intentional 12/09/2012   Past Surgical History:  Procedure Laterality Date   BREAST LUMPECTOMY  1998   left with radiation   CHOLECYSTECTOMY     COLON SURGERY     KNEE ARTHROSCOPY  10/15/05   TONSILLECTOMY      Social History:  reports that she has never smoked. She has never used smokeless tobacco. She reports that she does not drink alcohol and does not use drugs.   Allergies  Allergen Reactions   Penicillins Other (See Comments) and Itching    Rash     Family History  Problem Relation Age of Onset   Hypertension Mother    Hypertension Brother    Heart attack Neg Hx    Stroke Neg Hx       Prior to Admission medications   Medication Sig Start Date End Date Taking? Authorizing Provider  acetaminophen (TYLENOL) 500 MG tablet Take 500 mg by mouth every 8 (eight) hours as needed for moderate pain.   Yes [provider]  atenolol (TENORMIN) 25 MG tablet TAKE 1 TABLET(25 MG) BY MOUTH DAILY Patient taking differently: Take 25 mg by mouth daily. 10/24/21  Yes Jettie Booze, MD  clopidogrel (PLAVIX) 75 MG tablet TAKE 1 TABLET(75 MG) BY  MOUTH DAILY Patient taking differently: Take 75 mg by mouth once. 06/15/19  Yes Biagio Borg, MD  Cyanocobalamin (VITAMIN B12 PO) Take 1 tablet by mouth daily.   Yes [provider]  escitalopram (LEXAPRO) 10 MG tablet Take 1 tablet (10 mg total) by mouth daily. 06/15/19  Yes Biagio Borg, MD  polyethylene glycol powder [facility]) 17 GM/SCOOP powder Take 17 g by mouth daily as needed for moderate constipation.   Yes [provider]  pravastatin (PRAVACHOL) 40 MG tablet TAKE  1 TABLET BY MOUTH DAILY Patient taking differently: Take 40 mg by mouth daily. 04/28/21  Yes Biagio Borg, MD    Physical Exam: BP 114/69   Pulse 93   Temp (!) 93.7 F (34.3 C) (Rectal)   Resp 20   General: 88 y.o. year-old female well developed well nourished in no acute distress.  Alert and oriented x3. Cardiovascular: Regular rate and rhythm with no rubs or gallops.  No thyromegaly or JVD noted.  No lower extremity edema. 2/4 pulses in all 4 extremities. Respiratory: Clear to auscultation with no wheezes or rales. Good inspiratory effort. Abdomen: Soft nontender nondistended with normal bowel sounds x4 quadrants. Muskuloskeletal: No cyanosis, clubbing or edema noted bilaterally Neuro: CN II-XII intact, strength, sensation, reflexes Skin: No ulcerative lesions noted or rashes Psychiatry: Judgement and insight appear normal. Mood is appropriate for condition and setting          Labs on Admission:  Basic Metabolic Panel: Recent Labs  Lab 07/15/2022 1833  NA 137  K 4.0  CL 104  CO2 21*  GLUCOSE 73  BUN 20  CREATININE 1.11*  CALCIUM 8.5*  MG 1.9   Liver Function Tests: Recent Labs  Lab 07/22/2022 1833  AST 67*  ALT 15  ALKPHOS 96  BILITOT 0.8  PROT 6.9  ALBUMIN 2.0*   No results for input(s): "LIPASE", "AMYLASE" in the last 168 hours. No results for input(s): "AMMONIA" in the last 168 hours. CBC: Recent Labs  Lab 07/17/2022 1833  WBC 10.6*  NEUTROABS 7.8*  HGB 5.1*  HCT 16.2*  MCV 68.9*  PLT 95*   Cardiac Enzymes: No results for input(s): "CKTOTAL", "CKMB", "CKMBINDEX", "TROPONINI" in the last 168 hours.  BNP (last 3 results) No results for input(s): "BNP" in the last 8760 hours.  ProBNP (last 3 results) No results for input(s): "PROBNP" in the last 8760 hours.  CBG: No results for input(s): "GLUCAP" in the last 168 hours.  Radiological Exams on Admission: CT Head Wo Contrast  Result Date: 07/31/2022 CLINICAL DATA:  Altered mental status EXAM: CT  HEAD WITHOUT CONTRAST TECHNIQUE: Contiguous axial images were obtained from the base of the skull through the vertex without intravenous contrast. RADIATION DOSE REDUCTION: This exam was performed according to the departmental dose-optimization program which includes automated exposure control, adjustment of the mA and/or kV according to patient size and/or use of iterative reconstruction technique. COMPARISON:  10/26/2018 FINDINGS: Brain: Normal anatomic configuration. Parenchymal volume loss is commensurate with the patient's age but appears slightly progressive since prior examination. Moderate periventricular white matter changes are present likely reflecting the sequela of small vessel ischemia. Remote infarcts involving the left thalamus bifrontal periventricular white matter and medial left temporal cortex are again noted. No abnormal intra or extra-axial mass lesion or fluid collection. No abnormal mass effect or midline shift. No evidence of acute intracranial hemorrhage or infarct. Ventricular size is normal. Cerebellum unremarkable. Vascular: No asymmetric hyperdense vasculature at the skull base. Skull: Intact  Sinuses/Orbits: Paranasal sinuses are clear. Orbits are unremarkable. Other: Mastoid air cells and middle ear cavities are clear. IMPRESSION: 1. No acute intracranial hemorrhage or infarct. 2. Moderate senescent change, slightly progressive since prior examination. 3. 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 None Electronically Signed   By: Fidela Salisbury M.D.   On: 07/22/2022 19:14   DG Chest Portable 1 View  Result Date: 07/15/2022 CLINICAL DATA:  Altered mental status EXAM: PORTABLE CHEST 1 VIEW COMPARISON:  01/25/2021 FINDINGS: No acute airspace disease or pleural effusion. Normal cardiac size. Probable pleural scarring at the right CP angle. Tortuous ectatic thoracic aortic knob likely augmented by patient  rotation. Postsurgical changes of the left breast and axilla IMPRESSION: No active disease. Electronically Signed   By: Donavan Foil M.D.   On: 07/09/2022 18:58    EKG: I independently viewed the EKG done and my findings are as followed: Sinus tachycardia rate of 119.  Nonspecific ST-T changes.  QTc 487.  Assessment/Plan Present on Admission:  Failure to thrive in adult  Principal Problem:   Failure to thrive in adult  Failure to thrive in adult Severe protein calorie malnutrition Severe muscle mass loss Poor oral intake Gentle IV hydration D5 normal saline at 50 cc/h x 2 days.  Acute blood loss anemia Anemia of chronic disease/microcytic anemia Hemoglobin 5.1 on presentation with positive FOBT. GI will see in consultation in the morning Obtain iron levels  Presumptive UTI, POA UA positive for pyuria Started on Rocephin empirically, continue Follow urine culture for ID and sensitivities. Narrow down antibiotics as able.  Acute metabolic encephalopathy, likely multifactorial Treat underlying conditions, dehydration, UTI Reorient as needed Fall/aspiration precautions Mittens in place.  Surveyor, quantity.  Mild non-anion gap metabolic acidosis Serum bicarb 21, anion gap 12. Gentle IV fluid hydration Repeat chemistry panel in the morning  CKD 3B Appears to be close to her baseline creatinine Avoid nephrotoxic agents, dehydration and hypotension. Monitor urine output  Thrombocytopenia, unclear etiology Platelet count 95 on admission Monitor platelet count repeat CBC in the morning   DVT prophylaxis: Subcu Lovenox daily  Code Status: DNR/DNI.  EDP discussed with family CODE STATUS.  Family Communication: None at bedside.  Disposition Plan: Admitted to progressive care unit  Consults called: None.  Admission status: Inpatient status.   Status is: Inpatient The patient requires at least 2 midnights for further evaluation and treatment of present  condition.   Kayleen Memos MD [facility] Pager [phone]  If 7PM-7AM, please contact night-coverage [email or web address] Password [facility]  07/25/2022, 10:55 PM

## 2022-07-18 NOTE — ED Notes (Signed)
Pt removed her PIV In lt arm. Noted sheets and gown was wet. Made provider aware of same and that unsure how much of the fluid pt received. Pt was cleaned and changed at this time with fresh linen and gown. Pt also adjusted in bed at this time

## 2022-07-18 NOTE — ED Triage Notes (Signed)
Pt via EMS from home. Family reports increased AMS, and a change in the last 2 days not eating. Failure to thrive

## 2022-07-18 NOTE — ED Notes (Signed)
Received verbal report from Jennell Corner RN at this time

## 2022-07-19 DIAGNOSIS — L8990 Pressure ulcer of unspecified site, unspecified stage: Secondary | ICD-10-CM | POA: Insufficient documentation

## 2022-07-19 DIAGNOSIS — R627 Adult failure to thrive: Secondary | ICD-10-CM | POA: Diagnosis not present

## 2022-07-19 LAB — COMPREHENSIVE METABOLIC PANEL
ALT: 12 U/L (ref 0–44)
AST: 61 U/L — ABNORMAL HIGH (ref 15–41)
Albumin: 1.8 g/dL — ABNORMAL LOW (ref 3.5–5.0)
Alkaline Phosphatase: 80 U/L (ref 38–126)
Anion gap: 8 (ref 5–15)
BUN: 20 mg/dL (ref 8–23)
CO2: 20 mmol/L — ABNORMAL LOW (ref 22–32)
Calcium: 7.8 mg/dL — ABNORMAL LOW (ref 8.9–10.3)
Chloride: 111 mmol/L (ref 98–111)
Creatinine, Ser: 0.99 mg/dL (ref 0.44–1.00)
GFR, Estimated: 53 mL/min — ABNORMAL LOW (ref >60)
Glucose, Bld: 68 mg/dL — ABNORMAL LOW (ref 70–99)
Potassium: 4 mmol/L (ref 3.5–5.1)
Sodium: 139 mmol/L (ref 135–145)
Total Bilirubin: 0.5 mg/dL (ref 0.3–1.2)
Total Protein: 6.2 g/dL — ABNORMAL LOW (ref 6.5–8.1)

## 2022-07-19 LAB — CBC WITH DIFFERENTIAL/PLATELET
Abs Immature Granulocytes: 0.02 10*3/uL (ref 0.00–0.07)
Basophils Absolute: 0 10*3/uL (ref 0.0–0.1)
Basophils Relative: 0 %
Eosinophils Absolute: 0 10*3/uL (ref 0.0–0.5)
Eosinophils Relative: 0 %
HCT: 25.9 % — ABNORMAL LOW (ref 36.0–46.0)
Hemoglobin: 8.8 g/dL — ABNORMAL LOW (ref 12.0–15.0)
Immature Granulocytes: 0 %
Lymphocytes Relative: 17 %
Lymphs Abs: 1.3 10*3/uL (ref 0.7–4.0)
MCH: 26 pg (ref 26.0–34.0)
MCHC: 34 g/dL (ref 30.0–36.0)
MCV: 76.4 fL — ABNORMAL LOW (ref 80.0–100.0)
Monocytes Absolute: 0.4 10*3/uL (ref 0.1–1.0)
Monocytes Relative: 5 %
Neutro Abs: 6.2 10*3/uL (ref 1.7–7.7)
Neutrophils Relative %: 78 %
Platelets: 67 10*3/uL — ABNORMAL LOW (ref 150–400)
RBC: 3.39 MIL/uL — ABNORMAL LOW (ref 3.87–5.11)
RDW: 21.8 % — ABNORMAL HIGH (ref 11.5–15.5)
WBC: 8 10*3/uL (ref 4.0–10.5)
nRBC: 0.5 % — ABNORMAL HIGH (ref 0.0–0.2)

## 2022-07-19 LAB — BLOOD CULTURE ID PANEL (REFLEXED) - BCID2

## 2022-07-19 LAB — MAGNESIUM: Magnesium: 1.8 mg/dL (ref 1.7–2.4)

## 2022-07-19 LAB — FERRITIN: Ferritin: 48 ng/mL (ref 11–307)

## 2022-07-19 LAB — IRON AND TIBC
Iron: 129 ug/dL (ref 28–170)
Saturation Ratios: 55 % — ABNORMAL HIGH (ref 10.4–31.8)
TIBC: 237 ug/dL — ABNORMAL LOW (ref 250–450)
UIBC: 108 ug/dL

## 2022-07-19 LAB — AMMONIA: Ammonia: 30 umol/L (ref 9–35)

## 2022-07-19 LAB — PHOSPHORUS: Phosphorus: 3.1 mg/dL (ref 2.5–4.6)

## 2022-07-19 MED ORDER — SODIUM CHLORIDE 0.9 % IV SOLN
10.0000 ug/h | Freq: Once | INTRAVENOUS | Status: AC
  Administered 2022-07-19: 10 ug/h via INTRAVENOUS
  Filled 2022-07-19: qty 10

## 2022-07-19 MED ORDER — LEVOTHYROXINE SODIUM 25 MCG PO TABS
25.0000 ug | ORAL_TABLET | Freq: Every day | ORAL | Status: DC
  Administered 2022-07-21: 25 ug via ORAL
  Filled 2022-07-19: qty 1

## 2022-07-19 MED ORDER — HALOPERIDOL LACTATE 5 MG/ML IJ SOLN
1.0000 mg | Freq: Once | INTRAMUSCULAR | Status: AC
  Administered 2022-07-19: 1 mg via INTRAVENOUS
  Filled 2022-07-19: qty 1

## 2022-07-19 NOTE — Consult Note (Signed)
[facility] Gastroenterology Consultation Note  Referring Provider: No ref. provider found Primary Care Physician:  Biagio Borg, MD Primary Gastroenterologist:  Dr. Cristina Gong  Reason for Consultation:  anemia, weakness  HPI: Vanessa Clayton is a 88 y.o. female admitted weakness, failure-to-thrive, anemia, hemoccult positive stool.  Saw Dr. Cristina Gong for colonoscopy about 20 years ago, hasn't seen [place] since.  Patient is awake but confused; unable to obtain history; no clear overt GI bleeding.   Past Medical History:  Diagnosis Date   Allergy    Anemia 07/19/2018   Asymptomatic PVCs 12/09/2012   Benign hypertensive heart disease without heart failure 12/04/2010   Bilateral hearing loss 06/15/2019   Breast cancer ([place])    left   Chronic anemia    Chronic anxiety    Constipation 01/10/2020   Depression 10/08/8339   Diastolic dysfunction 9/62/2297   Dyspnea on exertion 06/17/2013   Essential hypertension 12/21/2015   History of TIA (transient ischemic attack) 2017, 2020   HLD (hyperlipidemia) 12/21/2015   HTN (hypertension)    Hyperglycemia 06/15/2019   Hyperthyroidism    Hypothyroidism    following treatment for hyperthyroidism   Medicare annual wellness visit, subsequent 03/12/2016   Orthostatic hypotension 03/26/2015   Peripheral neuropathy 07/19/2018   Protein-calorie malnutrition, severe ([place]) 05/09/2015   Sinus tachycardia 04/09/2015   Syncope and collapse    TIA (transient ischemic attack) 05/07/2015   Vitamin D deficiency    Weight loss, non-intentional 12/09/2012    Past Surgical History:  Procedure Laterality Date   BREAST LUMPECTOMY  1998   left with radiation   CHOLECYSTECTOMY     COLON SURGERY     KNEE ARTHROSCOPY  10/15/05   TONSILLECTOMY      Prior to Admission medications   Medication Sig Start Date End Date Taking? Authorizing Provider  acetaminophen (TYLENOL) 500 MG tablet Take 500 mg by mouth every 8 (eight) hours as needed for moderate pain.   Yes [provider]   atenolol (TENORMIN) 25 MG tablet TAKE 1 TABLET(25 MG) BY MOUTH DAILY Patient taking differently: Take 25 mg by mouth daily. 10/24/21  Yes Jettie Booze, MD  clopidogrel (PLAVIX) 75 MG tablet TAKE 1 TABLET(75 MG) BY MOUTH DAILY Patient taking differently: Take 75 mg by mouth once. 06/15/19  Yes Biagio Borg, MD  Cyanocobalamin (VITAMIN B12 PO) Take 1 tablet by mouth daily.   Yes [provider]  escitalopram (LEXAPRO) 10 MG tablet Take 1 tablet (10 mg total) by mouth daily. 06/15/19  Yes Biagio Borg, MD  polyethylene glycol powder [facility]) 17 GM/SCOOP powder Take 17 g by mouth daily as needed for moderate constipation.   Yes [provider]  pravastatin (PRAVACHOL) 40 MG tablet TAKE 1 TABLET BY MOUTH DAILY Patient taking differently: Take 40 mg by mouth daily. 04/28/21  Yes Biagio Borg, MD    Current Facility-Administered Medications  Medication Dose Route Frequency Provider Last Rate Last Admin   acetaminophen (TYLENOL) suppository 650 mg  650 mg Rectal Q4H PRN Irene Pap N, DO       acetaminophen (TYLENOL) tablet 650 mg  650 mg Oral Q6H PRN Irene Pap N, DO       cefTRIAXone (ROCEPHIN) 2 g in sodium chloride 0.9 % 100 mL IVPB  2 g Intravenous Q24H Hall, Carole N, DO       dextrose 5 %-0.9 % sodium chloride infusion   Intravenous Continuous Kayleen Memos, DO 50 mL/hr at 07/19/22 0101 New Bag at 07/19/22 0101  enoxaparin (LOVENOX) injection 30 mg  30 mg Subcutaneous Daily Irene Pap N, DO   30 mg at 07/19/22 0932   melatonin tablet 5 mg  5 mg Oral QHS PRN Kayleen Memos, DO       polyethylene glycol (MIRALAX / GLYCOLAX) packet 17 g  17 g Oral Daily PRN Irene Pap N, DO       prochlorperazine (COMPAZINE) injection 5 mg  5 mg Intravenous Q6H PRN Irene Pap N, DO        Allergies as of 07/12/2022 - Review Complete 07/09/2022  Allergen Reaction Noted   Penicillins Other (See Comments) and Itching 12/03/2010    Family History  Problem  Relation Age of Onset   Hypertension Mother    Hypertension Brother    Heart attack Neg Hx    Stroke Neg Hx     Social History   Socioeconomic History   Marital status: Widowed    Spouse name: Not on file   Number of children: 1   Years of education: 80   Highest education level: Not on file  Occupational History   Not on file  Tobacco Use   Smoking status: Never   Smokeless tobacco: Never  Vaping Use   Vaping Use: Never used  Substance and Sexual Activity   Alcohol use: No   Drug use: No   Sexual activity: Not on file  Other Topics Concern   Not on file  Social History Narrative   Fun: Travel    12/13/2018 lives alone, grandchildren go daily and helps her   Denies abuse and feels safe at home.    Social Determinants of Health   Financial Resource Strain: Not on file  Food Insecurity: Not on file  Transportation Needs: Not on file  Physical Activity: Not on file  Stress: Not on file  Social Connections: Not on file  Intimate Partner Violence: Not on file    Review of Systems: Unable to obtain due to altered mental status  Physical Exam: Vital signs in last 24 hours: Temp:  [93.7 F (34.3 C)-99.2 F (37.3 C)] 98.4 F (36.9 C) (11/18 1300) Pulse Rate:  [65-118] 67 (11/18 1300) Resp:  [10-23] 18 (11/18 1300) BP: (93-138)/(59-88) 132/86 (11/18 1300) SpO2:  [99 %-100 %] 100 % (11/18 1300) Last BM Date : 07/12/2022 General:   Profound cachexia, elderly, frail, chronically ill-appearing Eyes:  Sclera clear, no icterus.   Conjunctiva pale Ears:  Normal auditory acuity. Nose:  No deformity, discharge,  or lesions. Mouth:  No deformity or lesions.  Oropharynx dry and pale. Neck:  Supple; no masses or thyromegaly. Lungs:  No acute distress. Heart:  Regular Abdomen:  Soft, nontender and nondistended. No masses, hepatosplenomegaly or hernias noted. Normal bowel sounds, without guarding, and without rebound.     Msk:  Profound symmetrical wasting/atrophy Pulses:   Normal pulses noted. Extremities:  Without clubbing or edema. Neurologic:  Awake but confused, can not answer questions Skin:  diffusely dry and flaking skin, Intact without significant lesions or rashes. Cervical Nodes:  No significant cervical adenopathy. Psych:  Confused, not alert, not oriented   Lab Results: Recent Labs    07/16/2022 1833 07/19/22 0722  WBC 10.6* 8.0  HGB 5.1* 8.8*  HCT 16.2* 25.9*  PLT 95* 67*   BMET Recent Labs    07/27/2022 1833 07/19/22 0722  NA 137 139  K 4.0 4.0  CL 104 111  CO2 21* 20*  GLUCOSE 73 68*  BUN 20 20  CREATININE  1.11* 0.99  CALCIUM 8.5* 7.8*   LFT Recent Labs    07/19/22 0722  PROT 6.2*  ALBUMIN 1.8*  AST 61*  ALT 12  ALKPHOS 80  BILITOT 0.5   PT/INR Recent Labs    07/03/2022 1845  LABPROT 13.6  INR 1.1    Studies/Results: CT Head Wo Contrast  Result Date: 07/13/2022 CLINICAL DATA:  Altered mental status EXAM: CT HEAD WITHOUT CONTRAST TECHNIQUE: Contiguous axial images were obtained from the base of the skull through the vertex without intravenous contrast. RADIATION DOSE REDUCTION: This exam was performed according to the departmental dose-optimization program which includes automated exposure control, adjustment of the mA and/or kV according to patient size and/or use of iterative reconstruction technique. COMPARISON:  10/26/2018 FINDINGS: Brain: Normal anatomic configuration. Parenchymal volume loss is commensurate with the patient's age but appears slightly progressive since prior examination. Moderate periventricular white matter changes are present likely reflecting the sequela of small vessel ischemia. Remote infarcts involving the left thalamus bifrontal periventricular white matter and medial left temporal cortex are again noted. No abnormal intra or extra-axial mass lesion or fluid collection. No abnormal mass effect or midline shift. No evidence of acute intracranial hemorrhage or infarct. Ventricular size is normal.  Cerebellum unremarkable. Vascular: No asymmetric hyperdense vasculature at the skull base. Skull: Intact Sinuses/Orbits: Paranasal sinuses are clear. Orbits are unremarkable. Other: Mastoid air cells and middle ear cavities are clear. IMPRESSION: 1. No acute intracranial hemorrhage or infarct. 2. Moderate senescent change, slightly progressive since prior examination. 3. 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 None Electronically Signed   By: Fidela Salisbury M.D.   On: 07/09/2022 19:14   DG Chest Portable 1 View  Result Date: 07/26/2022 CLINICAL DATA:  Altered mental status EXAM: PORTABLE CHEST 1 VIEW COMPARISON:  01/25/2021 FINDINGS: No acute airspace disease or pleural effusion. Normal cardiac size. Probable pleural scarring at the right CP angle. Tortuous ectatic thoracic aortic knob likely augmented by patient rotation. Postsurgical changes of the left breast and axilla IMPRESSION: No active disease. Electronically Signed   By: Donavan Foil M.D.   On: 07/07/2022 18:58    Impression:   Failure to thrive. Weakness. Weight loss. Anemia. Hemoccult-positive stool without overt bleeding. Confusion.  Plan:   Patient is in absolutely no shape for any kind of endoscopic evaluation or GI work-up.  Regardless of findings on any type of testing, given her profound cachexia and overall medical condition, she would not be candidate for any kind of GI management other than supportive care.  Even with overt destabilizing GI bleeding, which patient fortunately does not have at this time, I would question the ethical appropriateness of endoscopic evaluation. Follow CBCs, transfuse if needed. Pantoprazole 40 mg po qd. Further work-up and management per primary team. [facility] will sign-off.   LOS: 1 day   Urijah Raynor M  07/19/2022, 3:08 PM  Cell [phone] If no answer or after 5 PM call [phone]

## 2022-07-19 NOTE — Evaluation (Signed)
Clinical/Bedside Swallow Evaluation Patient Details  Name: Vanessa Clayton MRN: [id number] Date of Birth: 1926-07-28  Today's Date: 07/19/2022 Time: SLP Start Time (ACUTE ONLY): 5397 SLP Stop Time (ACUTE ONLY): 6734 SLP Time Calculation (min) (ACUTE ONLY): 14 min  Past Medical History:  Past Medical History:  Diagnosis Date   Allergy    Anemia 07/19/2018   Asymptomatic PVCs 12/09/2012   Benign hypertensive heart disease without heart failure 12/04/2010   Bilateral hearing loss 06/15/2019   Breast cancer ([place])    left   Chronic anemia    Chronic anxiety    Constipation 01/10/2020   Depression 09/09/3788   Diastolic dysfunction 2/40/9735   Dyspnea on exertion 06/17/2013   Essential hypertension 12/21/2015   History of TIA (transient ischemic attack) 2017, 2020   HLD (hyperlipidemia) 12/21/2015   HTN (hypertension)    Hyperglycemia 06/15/2019   Hyperthyroidism    Hypothyroidism    following treatment for hyperthyroidism   Medicare annual wellness visit, subsequent 03/12/2016   Orthostatic hypotension 03/26/2015   Peripheral neuropathy 07/19/2018   Protein-calorie malnutrition, severe ([place]) 05/09/2015   Sinus tachycardia 04/09/2015   Syncope and collapse    TIA (transient ischemic attack) 05/07/2015   Vitamin D deficiency    Weight loss, non-intentional 12/09/2012   Past Surgical History:  Past Surgical History:  Procedure Laterality Date   BREAST LUMPECTOMY  1998   left with radiation   CHOLECYSTECTOMY     COLON SURGERY     KNEE ARTHROSCOPY  10/15/05   TONSILLECTOMY     HPI:  Vanessa Clayton is a 88 y.o. female who presented to [facility] ED from home due to altered mental status and significant decrease in oral intake, acute anemia. Dx include failure to thrive in adult, severe protein calorie malnutrition, acute metabolic encephalopathy. PMHx includes hypertension, hyperlipidemia, thyrotoxicosis, chronic diastolic CHF, TIA in 3299, unintentional weight loss, CKD 3B.    Assessment / Plan /  Recommendation  Clinical Impression  Vanessa Clayton participated in clinical swallowing assessment per Dr. Justus Memory consent. She verbalized initially during session and asked to take her "gloves" off, but did not talk further. She did not follow commands for oral mechanism exam or oral care. She accepted ice chips, sips of water, and applesauce with adequate oral attention and manipulation.  There were notable multiple sub-swallows with each sip of liquid and teaspoon of puree, but no coughing.  No obvious s/s of aspiration, but there is potential for pharyngeal and/or esophageal residue.  When pt is ready to resume POs per medical team, please start a dysphagia 1 diet with thin liquids; crush meds when feasible. SLP will follow for diet advancement and/or instrumental swallow study if warranted. SLP Visit Diagnosis: Dysphagia, unspecified (R13.10)           Diet Recommendation   Dysphagia 1, thin liquids when cleared for a PO diet.  Medication Administration: Crushed with puree    Other  Recommendations Oral Care Recommendations: Oral care BID    Recommendations for follow up therapy are one component of a multi-disciplinary discharge planning process, led by the attending physician.  Recommendations may be updated based on patient status, additional functional criteria and insurance authorization.  Follow up Recommendations No SLP follow up      Assistance Recommended at Discharge    Functional Status Assessment    Frequency and Duration min 2x/week  1 week       Prognosis Prognosis for Safe Diet Advancement: Good      Swallow  Study   General Date of Onset: 07/07/2022 HPI: Vanessa Clayton is a 88 y.o. female who presented to [facility] ED from home due to altered mental status and significant decrease in oral intake, acute anemia. Dx include failure to thrive in adult, severe protein calorie malnutrition, acute metabolic encephalopathy. PMHx includes hypertension, hyperlipidemia, thyrotoxicosis,  chronic diastolic CHF, TIA in 1610, unintentional weight loss, CKD 3B. Type of Study: Bedside Swallow Evaluation Previous Swallow Assessment: no Diet Prior to this Study: NPO Temperature Spikes Noted: No Respiratory Status: Room air History of Recent Intubation: No Behavior/Cognition: Alert;Doesn't follow directions Oral Cavity Assessment:  (difficult to assess) Oral Care Completed by SLP: No (difficult to complete) Oral Cavity - Dentition: Missing dentition Self-Feeding Abilities: Needs assist Patient Positioning: Upright in bed Baseline Vocal Quality: Normal Volitional Cough: Cognitively unable to elicit Volitional Swallow: Unable to elicit    Oral/Motor/Sensory Function Overall Oral Motor/Sensory Function: Other (comment) (symmetric at baseline)   Ice Chips Ice chips: Within functional limits   Thin Liquid Thin Liquid: Impaired Presentation: Straw;Cup Pharyngeal  Phase Impairments: Multiple swallows    Nectar Thick Nectar Thick Liquid: Not tested   Honey Thick Honey Thick Liquid: Not tested   Puree Puree: Impaired Presentation: Spoon Pharyngeal Phase Impairments: Multiple swallows   Solid     Solid: Not tested      [name] 07/19/2022,9:37 AM  Estill Bamberg L. Tivis Ringer, MA CCC/SLP Clinical Specialist - [place] Office number [phone]

## 2022-07-19 NOTE — ED Notes (Signed)
Bear hugger applied at this time.

## 2022-07-19 NOTE — ED Notes (Signed)
Sitter remains at bedside at this time. Pt is resting quietly with NAD noted, resp equal and non-labored

## 2022-07-19 NOTE — Progress Notes (Signed)
PHARMACY - PHYSICIAN COMMUNICATION CRITICAL VALUE ALERT - BLOOD CULTURE IDENTIFICATION (BCID)  Vanessa Clayton is an 88 y.o. female who presented to [facility] on 07/22/2022 with a chief complaint of failure to thrive  Assessment:  1 of 4 blood cultures (anaerobic bottle) growing GPC without identification on BCID  Name of physician (or Provider) Contacted: Dr. Avon Gully  Current antibiotics: Ceftriaxone 2g Q24H  Changes to prescribed antibiotics recommended:  Continue current antibiotics. Expect contaminant  Results for orders placed or performed during the hospital encounter of 07/09/2022  Blood Culture ID Panel (Reflexed) (Collected: 07/03/2022  6:30 PM)  Result Value Ref Range   Enterococcus faecalis NOT DETECTED NOT DETECTED   Enterococcus Faecium NOT DETECTED NOT DETECTED   Listeria monocytogenes NOT DETECTED NOT DETECTED   Staphylococcus species NOT DETECTED NOT DETECTED   Staphylococcus aureus (BCID) NOT DETECTED NOT DETECTED   Staphylococcus epidermidis NOT DETECTED NOT DETECTED   Staphylococcus lugdunensis NOT DETECTED NOT DETECTED   Streptococcus species NOT DETECTED NOT DETECTED   Streptococcus agalactiae NOT DETECTED NOT DETECTED   Streptococcus pneumoniae NOT DETECTED NOT DETECTED   Streptococcus pyogenes NOT DETECTED NOT DETECTED   A.calcoaceticus-baumannii NOT DETECTED NOT DETECTED   Bacteroides fragilis NOT DETECTED NOT DETECTED   Enterobacterales NOT DETECTED NOT DETECTED   Enterobacter cloacae complex NOT DETECTED NOT DETECTED   Escherichia coli NOT DETECTED NOT DETECTED   Klebsiella aerogenes NOT DETECTED NOT DETECTED   Klebsiella oxytoca NOT DETECTED NOT DETECTED   Klebsiella pneumoniae NOT DETECTED NOT DETECTED   Proteus species NOT DETECTED NOT DETECTED   Salmonella species NOT DETECTED NOT DETECTED   Serratia marcescens NOT DETECTED NOT DETECTED   Haemophilus influenzae NOT DETECTED NOT DETECTED   Neisseria meningitidis NOT DETECTED NOT DETECTED    Pseudomonas aeruginosa NOT DETECTED NOT DETECTED   Stenotrophomonas maltophilia NOT DETECTED NOT DETECTED   Candida albicans NOT DETECTED NOT DETECTED   Candida auris NOT DETECTED NOT DETECTED   Candida glabrata NOT DETECTED NOT DETECTED   Candida krusei NOT DETECTED NOT DETECTED   Candida parapsilosis NOT DETECTED NOT DETECTED   Candida tropicalis NOT DETECTED NOT DETECTED   Cryptococcus neoformans/gattii NOT DETECTED NOT DETECTED    Idamae Coccia C Barron Vanloan 07/19/2022  1:31 PM

## 2022-07-19 NOTE — Progress Notes (Addendum)
PROGRESS NOTE    Vanessa Clayton  KKX:[id number] DOB: Feb 28, 1926 DOA: 07/09/2022 PCP: Biagio Borg, MD   Brief Narrative:  Vanessa Clayton is a 88 y.o. female with medical history significant for hypertension, hyperlipidemia, thyrotoxicosis, chronic diastolic CHF, TIA in 1696, unintentional weight loss, CKD 3B, who presented to [facility] ED from home due to altered mental status and significant decrease in oral intake, for the past 2 days.  Noted to be profoundly anemic, hypovolemic with positive FOBT in the ED.  UA suggestive for UTI.  Hospitalist called for admission.   Assessment & Plan:   Principal Problem:   Failure to thrive in adult Active Problems:   Pressure injury of skin   Rule out hypothyroid crisis Family indicate patient has not taken thyroid medication in months (upwards of 6) unclear accuracy TSH >70, T4 0.4 Initiate IV levothyroxine x1 at 44mg/hr Start PO levothyroxine 2101m in am  Acute metabolic encephalopathy, likely multifactorial In the setting of hypovolemia, hypotension, profound anemia, failure to thrive and concurrent UTI Continue supportive care, treatment outlined as below  Sepsis secondary to UTI, POA Leukocytosis, tachypnea, hypothermic with notable source given abnormal UA Continue ceftriaxone x3 days, follow cultures, broaden if indicated  Failure to thrive in adult Severe protein calorie malnutrition Severe muscle mass loss noted on exam Poor oral intake reported at intake Continue IV fluids with D5 in the interim, otherwise continue supportive care, advance diet as tolerated once safe to do so  Acute blood loss anemia Multifactorial anemia of chronic disease/microcytic anemia Hemoglobin 5.1 on presentation with positive FOBT. [facility] following; Dr. ArHavery Morosotified in the ED    Component Value Date/Time   IRON 37 (L) 02/13/2021 1717   TIBC 232 (L) 05/09/2015 0511   FERRITIN 237.7 02/13/2021 1717   IRONPCTSAT 14.6 (L) 02/13/2021 1717    Non-anion gap metabolic acidosis In the setting of above Secondary to lactic acidosis, dehydration and failure to thrive   CKD 3B At baseline, continue IV fluids supportive care as above   Thrombocytopenia, unclear etiology Acute, previous labs within normal limits Discontinue Lovenox, likely complicating patient's questionable GI bleed and profound anemia given above  DVT prophylaxis: Discontinue Lovenox -SCDs only given above Code Status: DNR Family Communication: None present  Status is: Inpatient  Dispo: The patient is from: Home              Anticipated d/c is to: To be determined              Anticipated d/c date is: 72+ hours              Patient currently not medically stable for discharge  Consultants:  GI  Procedures:  Pending above  Antimicrobials:  Ceftriaxone  Subjective: No acute issues or events overnight, review of systems limited given patient's mental status  Objective: Vitals:   07/19/22 0246 07/19/22 0300 07/19/22 0530 07/19/22 0555  BP: 128/79 101/73 118/78 121/81  Pulse: 85 84 96 88  Resp: '13 12 14 14  '$ Temp: (!) 95.1 F (35.1 C) (!) 95.3 F (35.2 C) 99.2 F (37.3 C) 97.8 F (36.6 C)  TempSrc: Rectal Rectal Rectal Oral  SpO2: 99% 100% 99% 100%    Intake/Output Summary (Last 24 hours) at 07/19/2022 0729 Last data filed at 07/19/2022 0550 Gross per 24 hour  Intake 2586.75 ml  Output 30 ml  Net 2556.75 ml   There were no vitals filed for this visit.  Examination:  General:  Pleasantly resting in  bed, No acute distress. HEENT:  Normocephalic atraumatic.  Sclerae nonicteric, noninjected.  Extraocular movements intact bilaterally. Neck:  Without mass or deformity.  Trachea is midline. Lungs:  Clear to auscultate bilaterally without rhonchi, wheeze, or rales. Heart:  Regular rate and rhythm.  Without murmurs, rubs, or gallops. Abdomen:  Soft, nontender, nondistended.  Without guarding or rebound. Extremities: Without cyanosis,  clubbing, edema, or obvious deformity. Vascular:  Dorsalis pedis and posterior tibial pulses palpable bilaterally. Skin:  Warm and dry, no erythema, sacral decubitus ulcer noted at intake    Data Reviewed: I have personally reviewed following labs and imaging studies  CBC: Recent Labs  Lab 07/12/2022 1833  WBC 10.6*  NEUTROABS 7.8*  HGB 5.1*  HCT 16.2*  MCV 68.9*  PLT 95*   Basic Metabolic Panel: Recent Labs  Lab 07/04/2022 1833  NA 137  K 4.0  CL 104  CO2 21*  GLUCOSE 73  BUN 20  CREATININE 1.11*  CALCIUM 8.5*  MG 1.9   GFR: CrCl cannot be calculated (Unknown ideal weight.). Liver Function Tests: Recent Labs  Lab 07/28/2022 1833  AST 67*  ALT 15  ALKPHOS 96  BILITOT 0.8  PROT 6.9  ALBUMIN 2.0*   No results for input(s): "LIPASE", "AMYLASE" in the last 168 hours. No results for input(s): "AMMONIA" in the last 168 hours. Coagulation Profile: Recent Labs  Lab 07/21/2022 1845  INR 1.1   Cardiac Enzymes: No results for input(s): "CKTOTAL", "CKMB", "CKMBINDEX", "TROPONINI" in the last 168 hours. BNP (last 3 results) No results for input(s): "PROBNP" in the last 8760 hours. HbA1C: No results for input(s): "HGBA1C" in the last 72 hours. CBG: No results for input(s): "GLUCAP" in the last 168 hours. Lipid Profile: No results for input(s): "CHOL", "HDL", "LDLCALC", "TRIG", "CHOLHDL", "LDLDIRECT" in the last 72 hours. Thyroid Function Tests: Recent Labs    07/12/2022 1833  TSH 70.637*  FREET4 0.44*   Anemia Panel: No results for input(s): "VITAMINB12", "FOLATE", "FERRITIN", "TIBC", "IRON", "RETICCTPCT" in the last 72 hours. Sepsis Labs: Recent Labs  Lab 07/02/2022 1845 07/16/2022 2025  LATICACIDVEN 2.7* 2.0*    Recent Results (from the past 240 hour(s))  Blood Culture (routine x 2)     Status: None (Preliminary result)   Collection Time: 07/17/2022  6:30 PM   Specimen: BLOOD  Result Value Ref Range Status   Specimen Description BLOOD SITE NOT SPECIFIED   Final   Special Requests   Final    BOTTLES DRAWN AEROBIC AND ANAEROBIC Blood Culture adequate volume   Culture   Final    NO GROWTH < 12 HOURS Performed at [facility], [place] [address] [place], [place] [address]    Report Status PENDING  Incomplete  Blood Culture (routine x 2)     Status: None (Preliminary result)   Collection Time: 07/21/2022  6:30 PM   Specimen: BLOOD  Result Value Ref Range Status   Specimen Description BLOOD SITE NOT SPECIFIED  Final   Special Requests   Final    BOTTLES DRAWN AEROBIC AND ANAEROBIC Blood Culture results may not be optimal due to an excessive volume of blood received in culture bottles   Culture   Final    NO GROWTH < 12 HOURS Performed at [facility], [place] [address] [place], [place] [address]    Report Status PENDING  Incomplete         Radiology Studies: CT Head Wo Contrast  Result Date: 07/28/2022 CLINICAL DATA:  Altered mental  status EXAM: CT HEAD WITHOUT CONTRAST TECHNIQUE: Contiguous axial images were obtained from the base of the skull through the vertex without intravenous contrast. RADIATION DOSE REDUCTION: This exam was performed according to the departmental dose-optimization program which includes automated exposure control, adjustment of the mA and/or kV according to patient size and/or use of iterative reconstruction technique. COMPARISON:  10/26/2018 FINDINGS: Brain: Normal anatomic configuration. Parenchymal volume loss is commensurate with the patient's age but appears slightly progressive since prior examination. Moderate periventricular white matter changes are present likely reflecting the sequela of small vessel ischemia. Remote infarcts involving the left thalamus bifrontal periventricular white matter and medial left temporal cortex are again noted. No abnormal intra or extra-axial mass lesion or fluid collection. No abnormal mass effect or midline shift. No evidence of acute intracranial hemorrhage or infarct.  Ventricular size is normal. Cerebellum unremarkable. Vascular: No asymmetric hyperdense vasculature at the skull base. Skull: Intact Sinuses/Orbits: Paranasal sinuses are clear. Orbits are unremarkable. Other: Mastoid air cells and middle ear cavities are clear. IMPRESSION: 1. No acute intracranial hemorrhage or infarct. 2. Moderate senescent change, slightly progressive since prior examination. 3. 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 None Electronically Signed   By: Fidela Salisbury M.D.   On: 07/07/2022 19:14   DG Chest Portable 1 View  Result Date: 07/14/2022 CLINICAL DATA:  Altered mental status EXAM: PORTABLE CHEST 1 VIEW COMPARISON:  01/25/2021 FINDINGS: No acute airspace disease or pleural effusion. Normal cardiac size. Probable pleural scarring at the right CP angle. Tortuous ectatic thoracic aortic knob likely augmented by patient rotation. Postsurgical changes of the left breast and axilla IMPRESSION: No active disease. Electronically Signed   By: Donavan Foil M.D.   On: 07/22/2022 18:58    Scheduled Meds:  enoxaparin (LOVENOX) injection  30 mg Subcutaneous Daily   Continuous Infusions:  cefTRIAXone (ROCEPHIN)  IV     dextrose 5 % and 0.9% NaCl 50 mL/hr at 07/19/22 0101     LOS: 1 day   Time spent: 2mn  Yelina Sarratt C Dhrithi Riche, DO [facility]  If 7PM-7AM, please contact night-coverage [email or web address]  07/19/2022, 7:29 AM

## 2022-07-19 NOTE — Plan of Care (Signed)
  Problem: Education: Goal: Knowledge of General Education information will improve Description: Including pain rating scale, medication(s)/side effects and non-pharmacologic comfort measures 07/19/2022 1350 by Vonna Kotyk, RN Outcome: Progressing 07/19/2022 1349 by Vonna Kotyk, RN Outcome: Adequate for Discharge   Problem: Health Behavior/Discharge Planning: Goal: Ability to manage health-related needs will improve 07/19/2022 1350 by Vonna Kotyk, RN Outcome: Progressing 07/19/2022 1349 by Vonna Kotyk, RN Outcome: Adequate for Discharge   Problem: Clinical Measurements: Goal: Ability to maintain clinical measurements within normal limits will improve 07/19/2022 1350 by Vonna Kotyk, RN Outcome: Progressing 07/19/2022 1349 by Vonna Kotyk, RN Outcome: Adequate for Discharge Goal: Will remain free from infection 07/19/2022 1350 by Vonna Kotyk, RN Outcome: Progressing 07/19/2022 1349 by Vonna Kotyk, RN Outcome: Adequate for Discharge Goal: Diagnostic test results will improve 07/19/2022 1350 by Vonna Kotyk, RN Outcome: Progressing 07/19/2022 1349 by Vonna Kotyk, RN Outcome: Adequate for Discharge Goal: Respiratory complications will improve 07/19/2022 1350 by Vonna Kotyk, RN Outcome: Progressing 07/19/2022 1349 by Vonna Kotyk, RN Outcome: Adequate for Discharge Goal: Cardiovascular complication will be avoided 07/19/2022 1350 by Vonna Kotyk, RN Outcome: Progressing 07/19/2022 1349 by Vonna Kotyk, RN Outcome: Adequate for Discharge   Problem: Activity: Goal: Risk for activity intolerance will decrease 07/19/2022 1350 by Vonna Kotyk, RN Outcome: Progressing 07/19/2022 1349 by Vonna Kotyk, RN Outcome: Adequate for Discharge   Problem: Nutrition: Goal: Adequate nutrition will be maintained 07/19/2022 1350 by Vonna Kotyk, RN Outcome: Progressing 07/19/2022 1349 by Vonna Kotyk, RN Outcome: Adequate for Discharge   Problem:  Coping: Goal: Level of anxiety will decrease 07/19/2022 1350 by Vonna Kotyk, RN Outcome: Progressing 07/19/2022 1349 by Vonna Kotyk, RN Outcome: Adequate for Discharge   Problem: Elimination: Goal: Will not experience complications related to bowel motility 07/19/2022 1350 by Vonna Kotyk, RN Outcome: Progressing 07/19/2022 1349 by Vonna Kotyk, RN Outcome: Adequate for Discharge Goal: Will not experience complications related to urinary retention 07/19/2022 1350 by Vonna Kotyk, RN Outcome: Progressing 07/19/2022 1349 by Vonna Kotyk, RN Outcome: Adequate for Discharge   Problem: Pain Managment: Goal: General experience of comfort will improve 07/19/2022 1350 by Vonna Kotyk, RN Outcome: Progressing 07/19/2022 1349 by Vonna Kotyk, RN Outcome: Adequate for Discharge   Problem: Safety: Goal: Ability to remain free from injury will improve 07/19/2022 1350 by Vonna Kotyk, RN Outcome: Progressing 07/19/2022 1349 by Vonna Kotyk, RN Outcome: Adequate for Discharge   Problem: Skin Integrity: Goal: Risk for impaired skin integrity will decrease 07/19/2022 1350 by Vonna Kotyk, RN Outcome: Progressing 07/19/2022 1349 by Vonna Kotyk, RN Outcome: Adequate for Discharge

## 2022-07-20 DIAGNOSIS — R627 Adult failure to thrive: Secondary | ICD-10-CM | POA: Diagnosis not present

## 2022-07-20 LAB — BPAM RBC
Blood Product Expiration Date: 202311232359
Blood Product Expiration Date: 202312092359
ISSUE DATE / TIME: 202311172234
ISSUE DATE / TIME: 202311180236
Unit Type and Rh: 6200
Unit Type and Rh: 6200

## 2022-07-20 LAB — TYPE AND SCREEN
ABO/RH(D): A POS
Antibody Screen: NEGATIVE
Unit division: 0
Unit division: 0

## 2022-07-20 NOTE — Progress Notes (Signed)
PROGRESS NOTE    Vanessa Clayton  SWN:[id number] DOB: [birth year] DOA: 07/09/2022 PCP: Biagio Borg, MD   Brief Narrative:  Vanessa Clayton is a 88 y.o. female with medical history significant for hypertension, hyperlipidemia, thyrotoxicosis, chronic diastolic CHF, TIA in 9381, unintentional weight loss, CKD 3B, who presented to [facility] ED from home due to altered mental status and significant decrease in oral intake, for the past 2 days.  Noted to be profoundly anemic, hypovolemic with positive FOBT in the ED.  UA suggestive for UTI.  Hospitalist called for admission.   Assessment & Plan:   Principal Problem:   Failure to thrive in adult Active Problems:   Pressure injury of skin  Rule out hypothyroid crisis Family indicate patient has not taken thyroid medication in months (upwards of 6) unclear accuracy TSH >70, T4 0.4 Initiate IV levothyroxine x1 at 31mg/hr - will likely repeat dose given minimal improvement in symptoms Start PO levothyroxine 270m in am  Acute metabolic encephalopathy, likely multifactorial In the setting of hypovolemia, hypotension, profound anemia, failure to thrive and concurrent UTI Continue supportive care, treatment outlined as below  Sepsis secondary to UTI, POA Leukocytosis, tachypnea, hypothermic with notable source given abnormal UA Continue ceftriaxone x3 days, follow cultures, broaden if indicated  Failure to thrive in adult Severe protein calorie malnutrition Severe muscle mass loss noted on exam Poor oral intake reported at intake Continue IV fluids with D5 in the interim, otherwise continue supportive care, advance diet as tolerated once safe to do so  Acute blood loss anemia Multifactorial anemia of chronic disease/microcytic anemia Hemoglobin 5.1 on presentation with positive FOBT.  GI following; Dr. ArHavery Morosotified in the ED    Component Value Date/Time   IRON 129 07/19/2022 1402   TIBC 237 (L) 07/19/2022 1402   FERRITIN 48  07/19/2022 1402   IRONPCTSAT 55 (H) 07/19/2022 1402   Non-anion gap metabolic acidosis In the setting of above Secondary to lactic acidosis, dehydration and failure to thrive   CKD 3B At baseline, continue IV fluids supportive care as above   Thrombocytopenia, unclear etiology Acute, previous labs within normal limits Discontinue Lovenox, likely complicating patient's questionable GI bleed and profound anemia given above  DVT prophylaxis: Discontinue Lovenox -SCDs only given above Code Status: DNR Family Communication: Discussed with daughter over phone  Status is: Inpatient  Dispo: The patient is from: Home              Anticipated d/c is to: To be determined              Anticipated d/c date is: 72+ hours              Patient currently not medically stable for discharge  Consultants:  GI  Procedures:  Pending above  Antimicrobials:  Ceftriaxone  Subjective: No acute issues or events overnight, review of systems limited given patient's mental status/hearing difficulty  Objective: Vitals:   07/19/22 1733 07/19/22 1933 07/19/22 2310 07/20/22 0400  BP: 119/76 128/64 107/64 123/72  Pulse: 90 78 81 65  Resp: '16 14 10 14  '$ Temp: 98 F (36.7 C)  97.8 F (36.6 C)   TempSrc: Oral  Oral   SpO2: 100% 99% 99% 98%    Intake/Output Summary (Last 24 hours) at 07/20/2022 0754 Last data filed at 07/20/2022 0600 Gross per 24 hour  Intake 1725.32 ml  Output --  Net 1725.32 ml    There were no vitals filed for this visit.  Examination:  General:  Pleasantly resting in bed, No acute distress.  HEENT:  Normocephalic atraumatic.  Sclerae nonicteric, noninjected.  Extraocular movements intact bilaterally. Neck:  Without mass or deformity.  Trachea is midline. Lungs:  Clear to auscultate bilaterally without rhonchi, wheeze, or rales. Heart:  Regular rate and rhythm.  Without murmurs, rubs, or gallops. Abdomen:  Soft, nontender, nondistended.  Without guarding or  rebound. Extremities: Without cyanosis, clubbing, edema, or obvious deformity. Vascular:  Dorsalis pedis and posterior tibial pulses palpable bilaterally. Skin:  Warm and dry, no erythema, sacral decubitus ulcer noted at intake   Data Reviewed: I have personally reviewed following labs and imaging studies  CBC: Recent Labs  Lab 07/28/2022 1833 07/19/22 0722  WBC 10.6* 8.0  NEUTROABS 7.8* 6.2  HGB 5.1* 8.8*  HCT 16.2* 25.9*  MCV 68.9* 76.4*  PLT 95* 67*    Basic Metabolic Panel: Recent Labs  Lab 07/31/2022 1833 07/19/22 0722  NA 137 139  K 4.0 4.0  CL 104 111  CO2 21* 20*  GLUCOSE 73 68*  BUN 20 20  CREATININE 1.11* 0.99  CALCIUM 8.5* 7.8*  MG 1.9 1.8  PHOS  --  3.1    GFR: CrCl cannot be calculated (Unknown ideal weight.). Liver Function Tests: Recent Labs  Lab 07/31/2022 1833 07/19/22 0722  AST 67* 61*  ALT 15 12  ALKPHOS 96 80  BILITOT 0.8 0.5  PROT 6.9 6.2*  ALBUMIN 2.0* 1.8*    No results for input(s): "LIPASE", "AMYLASE" in the last 168 hours. Recent Labs  Lab 07/19/22 0722  AMMONIA 30   Coagulation Profile: Recent Labs  Lab 07/17/2022 1845  INR 1.1    Cardiac Enzymes: No results for input(s): "CKTOTAL", "CKMB", "CKMBINDEX", "TROPONINI" in the last 168 hours. BNP (last 3 results) No results for input(s): "PROBNP" in the last 8760 hours. HbA1C: No results for input(s): "HGBA1C" in the last 72 hours. CBG: No results for input(s): "GLUCAP" in the last 168 hours. Lipid Profile: No results for input(s): "CHOL", "HDL", "LDLCALC", "TRIG", "CHOLHDL", "LDLDIRECT" in the last 72 hours. Thyroid Function Tests: Recent Labs    07/11/2022 1833  TSH 70.637*  FREET4 0.44*    Anemia Panel: Recent Labs    07/19/22 1402  FERRITIN 48  TIBC 237*  IRON 129   Sepsis Labs: Recent Labs  Lab 07/29/2022 1845 07/19/2022 2025  LATICACIDVEN 2.7* 2.0*     Recent Results (from the past 240 hour(s))  Blood Culture (routine x 2)     Status: None (Preliminary  result)   Collection Time: 07/30/2022  6:30 PM   Specimen: BLOOD  Result Value Ref Range Status   Specimen Description BLOOD SITE NOT SPECIFIED  Final   Special Requests   Final    BOTTLES DRAWN AEROBIC AND ANAEROBIC Blood Culture adequate volume   Culture  Setup Time   Final    GRAM POSITIVE COCCI IN BOTH AEROBIC AND ANAEROBIC BOTTLES CRITICAL RESULT CALLED TO, READ BACK BY AND VERIFIED WITH: [place] [id number] AT 6283 BY EC Performed at [facility], [place] [address] [place], [place] [address]    Culture GRAM POSITIVE COCCI  Final   Report Status PENDING  Incomplete  Blood Culture (routine x 2)     Status: None (Preliminary result)   Collection Time: 07/26/2022  6:30 PM   Specimen: BLOOD  Result Value Ref Range Status   Specimen Description BLOOD SITE NOT SPECIFIED  Final   Special Requests   Final    BOTTLES DRAWN AEROBIC  AND ANAEROBIC Blood Culture results may not be optimal due to an excessive volume of blood received in culture bottles   Culture   Final    NO GROWTH 2 DAYS Performed at [facility] [address] [place], [place] [address]    Report Status PENDING  Incomplete  Blood Culture ID Panel (Reflexed)     Status: None   Collection Time: 07/15/2022  6:30 PM  Result Value Ref Range Status   Enterococcus faecalis NOT DETECTED NOT DETECTED Final   Enterococcus Faecium NOT DETECTED NOT DETECTED Final   Listeria monocytogenes NOT DETECTED NOT DETECTED Final   Staphylococcus species NOT DETECTED NOT DETECTED Final   Staphylococcus aureus (BCID) NOT DETECTED NOT DETECTED Final   Staphylococcus epidermidis NOT DETECTED NOT DETECTED Final   Staphylococcus lugdunensis NOT DETECTED NOT DETECTED Final   Streptococcus species NOT DETECTED NOT DETECTED Final   Streptococcus agalactiae NOT DETECTED NOT DETECTED Final   Streptococcus pneumoniae NOT DETECTED NOT DETECTED Final   Streptococcus pyogenes NOT DETECTED NOT DETECTED Final   A.calcoaceticus-baumannii NOT  DETECTED NOT DETECTED Final   Bacteroides fragilis NOT DETECTED NOT DETECTED Final   Enterobacterales NOT DETECTED NOT DETECTED Final   Enterobacter cloacae complex NOT DETECTED NOT DETECTED Final   Escherichia coli NOT DETECTED NOT DETECTED Final   Klebsiella aerogenes NOT DETECTED NOT DETECTED Final   Klebsiella oxytoca NOT DETECTED NOT DETECTED Final   Klebsiella pneumoniae NOT DETECTED NOT DETECTED Final   Proteus species NOT DETECTED NOT DETECTED Final   Salmonella species NOT DETECTED NOT DETECTED Final   Serratia marcescens NOT DETECTED NOT DETECTED Final   Haemophilus influenzae NOT DETECTED NOT DETECTED Final   Neisseria meningitidis NOT DETECTED NOT DETECTED Final   Pseudomonas aeruginosa NOT DETECTED NOT DETECTED Final   Stenotrophomonas maltophilia NOT DETECTED NOT DETECTED Final   Candida albicans NOT DETECTED NOT DETECTED Final   Candida auris NOT DETECTED NOT DETECTED Final   Candida glabrata NOT DETECTED NOT DETECTED Final   Candida krusei NOT DETECTED NOT DETECTED Final   Candida parapsilosis NOT DETECTED NOT DETECTED Final   Candida tropicalis NOT DETECTED NOT DETECTED Final   Cryptococcus neoformans/gattii NOT DETECTED NOT DETECTED Final    Comment: Performed at [facility], [address], [place], [place] [address]         Radiology Studies: CT Head Wo Contrast  Result Date: 07/15/2022 CLINICAL DATA:  Altered mental status EXAM: CT HEAD WITHOUT CONTRAST TECHNIQUE: Contiguous axial images were obtained from the base of the skull through the vertex without intravenous contrast. RADIATION DOSE REDUCTION: This exam was performed according to the departmental dose-optimization program which includes automated exposure control, adjustment of the mA and/or kV according to patient size and/or use of iterative reconstruction technique. COMPARISON:  10/26/2018 FINDINGS: Brain: Normal anatomic configuration. Parenchymal volume loss is commensurate with the patient's  age but appears slightly progressive since prior examination. Moderate periventricular white matter changes are present likely reflecting the sequela of small vessel ischemia. Remote infarcts involving the left thalamus bifrontal periventricular white matter and medial left temporal cortex are again noted. No abnormal intra or extra-axial mass lesion or fluid collection. No abnormal mass effect or midline shift. No evidence of acute intracranial hemorrhage or infarct. Ventricular size is normal. Cerebellum unremarkable. Vascular: No asymmetric hyperdense vasculature at the skull base. Skull: Intact Sinuses/Orbits: Paranasal sinuses are clear. Orbits are unremarkable. Other: Mastoid air cells and middle ear cavities are clear. IMPRESSION: 1. No acute intracranial hemorrhage  or infarct. 2. Moderate senescent change, slightly progressive since prior examination. 3. 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 None Electronically Signed   By: Fidela Salisbury M.D.   On: 07/09/2022 19:14   DG Chest Portable 1 View  Result Date: 07/08/2022 CLINICAL DATA:  Altered mental status EXAM: PORTABLE CHEST 1 VIEW COMPARISON:  01/25/2021 FINDINGS: No acute airspace disease or pleural effusion. Normal cardiac size. Probable pleural scarring at the right CP angle. Tortuous ectatic thoracic aortic knob likely augmented by patient rotation. Postsurgical changes of the left breast and axilla IMPRESSION: No active disease. Electronically Signed   By: Donavan Foil M.D.   On: 07/31/2022 18:58    Scheduled Meds:  enoxaparin (LOVENOX) injection  30 mg Subcutaneous Daily   levothyroxine  25 mcg Oral Q0600   Continuous Infusions:  cefTRIAXone (ROCEPHIN)  IV 2 g (07/19/22 1730)   dextrose 5 % and 0.9% NaCl 50 mL/hr at 07/19/22 0101     LOS: 2 days   Time spent: 2mn  Isiaha Greenup C Revia Nghiem, DO [facility]  If 7PM-7AM, please contact  night-coverage [email or web address]  07/20/2022, 7:54 AM

## 2022-07-21 DIAGNOSIS — R627 Adult failure to thrive: Secondary | ICD-10-CM | POA: Diagnosis not present

## 2022-07-21 LAB — CBC
HCT: 33.6 % — ABNORMAL LOW (ref 36.0–46.0)
Hemoglobin: 11.1 g/dL — ABNORMAL LOW (ref 12.0–15.0)
MCH: 25.6 pg — ABNORMAL LOW (ref 26.0–34.0)
MCHC: 33 g/dL (ref 30.0–36.0)
MCV: 77.4 fL — ABNORMAL LOW (ref 80.0–100.0)
Platelets: 83 10*3/uL — ABNORMAL LOW (ref 150–400)
RBC: 4.34 MIL/uL (ref 3.87–5.11)
RDW: 22.9 % — ABNORMAL HIGH (ref 11.5–15.5)
WBC: 8.1 10*3/uL (ref 4.0–10.5)
nRBC: 2 % — ABNORMAL HIGH (ref 0.0–0.2)

## 2022-07-21 LAB — COMPREHENSIVE METABOLIC PANEL
ALT: 12 U/L (ref 0–44)
AST: 50 U/L — ABNORMAL HIGH (ref 15–41)
Albumin: 1.8 g/dL — ABNORMAL LOW (ref 3.5–5.0)
Alkaline Phosphatase: 87 U/L (ref 38–126)
Anion gap: 11 (ref 5–15)
BUN: 17 mg/dL (ref 8–23)
CO2: 18 mmol/L — ABNORMAL LOW (ref 22–32)
Calcium: 7.7 mg/dL — ABNORMAL LOW (ref 8.9–10.3)
Chloride: 109 mmol/L (ref 98–111)
Creatinine, Ser: 0.96 mg/dL (ref 0.44–1.00)
GFR, Estimated: 55 mL/min — ABNORMAL LOW (ref >60)
Glucose, Bld: 86 mg/dL (ref 70–99)
Potassium: 3.4 mmol/L — ABNORMAL LOW (ref 3.5–5.1)
Sodium: 138 mmol/L (ref 135–145)
Total Bilirubin: 0.2 mg/dL — ABNORMAL LOW (ref 0.3–1.2)
Total Protein: 6.6 g/dL (ref 6.5–8.1)

## 2022-07-21 LAB — URINE CULTURE: Culture: >=100000 — AB

## 2022-07-21 MED ORDER — SODIUM CHLORIDE 0.9 % IV SOLN
10.0000 ug/h | Freq: Once | INTRAVENOUS | Status: AC
  Administered 2022-07-21: 10 ug/h via INTRAVENOUS
  Filled 2022-07-21: qty 10

## 2022-07-21 MED ORDER — LEVOTHYROXINE SODIUM 100 MCG PO TABS
100.0000 ug | ORAL_TABLET | Freq: Every day | ORAL | Status: DC
  Administered 2022-07-22: 100 ug via ORAL
  Filled 2022-07-21: qty 1

## 2022-07-21 MED ORDER — LEVOTHYROXINE SODIUM 25 MCG PO TABS: 25.0000 ug | ORAL_TABLET | Freq: Every day | ORAL | Status: DC

## 2022-07-21 MED ORDER — LEVOTHYROXINE SODIUM 100 MCG PO TABS
200.0000 ug | ORAL_TABLET | Freq: Every day | ORAL | Status: DC
  Administered 2022-07-21: 200 ug via ORAL
  Filled 2022-07-21: qty 2

## 2022-07-21 MED ORDER — LEVOTHYROXINE SODIUM 100 MCG PO TABS: 100.0000 ug | ORAL_TABLET | Freq: Every day | ORAL | Status: DC

## 2022-07-21 NOTE — Progress Notes (Signed)
Speech Language Pathology Treatment: Dysphagia  Patient Details Name: Vanessa Clayton MRN: [id number] DOB: 1925-11-03 Today's Date: 07/21/2022 Time: 4098-1191 SLP Time Calculation (min) (ACUTE ONLY): 29 min  Assessment / Plan / Recommendation Clinical Impression  Pt with overall improved presentation this date as compared to initial swallow eval (11/18). Pt communicating with clinician frequently throughout session, though with continued difficulty with attention/following commands and suspect in part due to reported hearing loss. She self-fed straw sips of thin liquids without clinical signs of aspiration. Bites of puree and regular textures significant for trace-min diffuse oral residuals, cleared with cued liquid wash. Residuals did not increase with more advanced textures and she was noted to clear spontaneously with repeat dry swallow occasionally. Multiple sub-swallowing with POs not noted this date. Recommend dys 3 diet/thin liquids with 1:1 supervision/assist for meals to cue for small bites/sips, slow rate and liquid wash for clearance of residue. Discussed with NT and RN who expressed agreement. Will f/u for tolerance.    HPI HPI: Vanessa Clayton is a 89 y.o. female who presented to [facility] ED from home due to altered mental status and significant decrease in oral intake, acute anemia. Dx include failure to thrive in adult, severe protein calorie malnutrition, acute metabolic encephalopathy. PMHx includes hypertension, hyperlipidemia, thyrotoxicosis, chronic diastolic CHF, TIA in 4782, unintentional weight loss, CKD 3B.      SLP Plan  Continue with current plan of care      Recommendations for follow up therapy are one component of a multi-disciplinary discharge planning process, led by the attending physician.  Recommendations may be updated based on patient status, additional functional criteria and insurance authorization.    Recommendations  Diet recommendations: Dysphagia 3 (mechanical  soft);Thin liquid Liquids provided via: Straw;Cup Medication Administration: Crushed with puree (or whole in puree as tolerated) Supervision: Staff to assist with self feeding;Full supervision/cueing for compensatory strategies Compensations: Minimize environmental distractions;Slow rate;Small sips/bites;Follow solids with liquid Postural Changes and/or Swallow Maneuvers: Seated upright 90 degrees                Oral Care Recommendations: Oral care BID Follow Up Recommendations: No SLP follow up Assistance recommended at discharge: Frequent or constant Supervision/Assistance SLP Visit Diagnosis: Dysphagia, unspecified (R13.10) Plan: Continue with current plan of care            Ellwood Dense, [place], [place] Office Number: [place]  07/21/2022, 9:12 AM

## 2022-07-21 NOTE — Progress Notes (Signed)
  Transition of Care [facility]) Screening Note   Patient Details  Name: Vanessa Clayton Date of Birth: [birth year]   Transition of Care [facility]) CM/SW Contact:    Cyndi Bender, RN Phone Number: 07/21/2022, 8:48 AM    Transition of Care Department [facility]) has reviewed patient and no TOC needs have been identified at this time. We will continue to monitor patient advancement through interdisciplinary progression rounds. If new patient transition needs arise, please place a TOC consult.

## 2022-07-21 NOTE — Progress Notes (Signed)
PROGRESS NOTE    Vanessa Clayton  YIR:[id number] DOB: 09/13/1925 DOA: 07/11/2022 PCP: Biagio Borg, MD   Brief Narrative:  Vanessa Clayton is a 88 y.o. female with medical history significant for hypertension, hyperlipidemia, thyrotoxicosis, chronic diastolic CHF, TIA in 5009, unintentional weight loss, CKD 3B, who presented to [facility] ED from home due to altered mental status and significant decrease in oral intake, for the past 2 days.  Noted to be profoundly anemic, hypovolemic with positive FOBT in the ED.  UA suggestive for UTI.  Hospitalist called for admission.   Assessment & Plan:   Principal Problem:   Failure to thrive in adult Active Problems:   Pressure injury of skin  Hypothyroid crisis -Family indicate patient has not taken thyroid medication (upwards of 6 months) unclear accuracy -TSH >70, T4 0.4 -Initiate IV levothyroxine x2 -improving somewhat over the last 24 hours -Start higher dose p.o. levothyroxine 100 x 2 days transition to 75mg thereafter (previously on 25) -Repeat thyroid testing outpatient in 3 to 4 weeks, adjustments likely to follow  Acute metabolic encephalopathy, likely multifactorial - In the setting of above as well as concurrent hypovolemia, hypotension, profound anemia, failure to thrive and UTI -Improved drastically over the past 24 hours, continue current treatment  Sepsis secondary to UTI, POA Leukocytosis, tachypnea, hypothermic with notable source given abnormal UA Continue ceftriaxone x3 days, follow cultures, broaden if indicated  Failure to thrive in adult Severe protein calorie malnutrition Severe muscle mass loss noted on exam Poor oral intake reported at intake Continue IV fluids with D5 in the interim, otherwise continue supportive care, advance diet as tolerated once safe to do so  Acute blood loss anemia Multifactorial anemia of chronic disease/microcytic anemia Hemoglobin 5.1 on presentation with positive FOBT. Colon GI signed off;  no current indication for endoscopy    Component Value Date/Time   IRON 129 07/19/2022 1402   TIBC 237 (L) 07/19/2022 1402   FERRITIN 48 07/19/2022 1402   IRONPCTSAT 55 (H) 07/19/2022 1402   Non-anion gap metabolic acidosis In the setting of above Secondary to lactic acidosis, dehydration and failure to thrive   CKD 3B At baseline, continue IV fluids supportive care as above   Thrombocytopenia, unclear etiology Acute, previous labs within normal limits Discontinue Lovenox, likely complicating patient's questionable GI bleed and profound anemia given above  DVT prophylaxis: Discontinue Lovenox -SCDs only given above Code Status: DNR Family Communication: Discussed with daughter over phone  Status is: Inpatient  Dispo: The patient is from: Home              Anticipated d/c is to: To be determined              Anticipated d/c date is: 72+ hours              Patient currently not medically stable for discharge  Consultants:  GI  Procedures:  Pending above  Antimicrobials:  Ceftriaxone  Subjective: No acute issues or events overnight, review of systems limited given patient's mental status/hearing difficulty  Objective: Vitals:   07/20/22 2000 07/20/22 2307 07/21/22 0000 07/21/22 0319  BP: 135/83 125/82 133/85 (!) 134/93  Pulse: 76 76 75 77  Resp: '15 11 16 14  '$ Temp: (!) 97 F (36.1 C) 97.7 F (36.5 C) (!) 97.5 F (36.4 C) 97.6 F (36.4 C)  TempSrc: Oral Oral Axillary Axillary  SpO2: 96% 97% 98% 98%    Intake/Output Summary (Last 24 hours) at 07/21/2022 0750 Last data filed at  07/21/2022 0500 Gross per 24 hour  Intake --  Output 50 ml  Net -50 ml    There were no vitals filed for this visit.  Examination:  General:  Pleasantly resting in bed, No acute distress.  HEENT:  Normocephalic atraumatic.  Sclerae nonicteric, noninjected.  Extraocular movements intact bilaterally. Neck:  Without mass or deformity.  Trachea is midline. Lungs:  Clear to  auscultate bilaterally without rhonchi, wheeze, or rales. Heart:  Regular rate and rhythm.  Without murmurs, rubs, or gallops. Abdomen:  Soft, nontender, nondistended.  Without guarding or rebound. Extremities: Without cyanosis, clubbing, edema, or obvious deformity. Vascular:  Dorsalis pedis and posterior tibial pulses palpable bilaterally. Skin:  Warm and dry, no erythema, sacral decubitus ulcer noted at intake   Data Reviewed: I have personally reviewed following labs and imaging studies  CBC: Recent Labs  Lab 07/02/2022 1833 07/19/22 0722 07/21/22 0533  WBC 10.6* 8.0 8.1  NEUTROABS 7.8* 6.2  --   HGB 5.1* 8.8* 11.1*  HCT 16.2* 25.9* 33.6*  MCV 68.9* 76.4* 77.4*  PLT 95* 67* 83*    Basic Metabolic Panel: Recent Labs  Lab 07/09/2022 1833 07/19/22 0722  NA 137 139  K 4.0 4.0  CL 104 111  CO2 21* 20*  GLUCOSE 73 68*  BUN 20 20  CREATININE 1.11* 0.99  CALCIUM 8.5* 7.8*  MG 1.9 1.8  PHOS  --  3.1    GFR: CrCl cannot be calculated (Unknown ideal weight.). Liver Function Tests: Recent Labs  Lab 07/06/2022 1833 07/19/22 0722  AST 67* 61*  ALT 15 12  ALKPHOS 96 80  BILITOT 0.8 0.5  PROT 6.9 6.2*  ALBUMIN 2.0* 1.8*    No results for input(s): "LIPASE", "AMYLASE" in the last 168 hours. Recent Labs  Lab 07/19/22 0722  AMMONIA 30    Coagulation Profile: Recent Labs  Lab 07/17/2022 1845  INR 1.1    Cardiac Enzymes: No results for input(s): "CKTOTAL", "CKMB", "CKMBINDEX", "TROPONINI" in the last 168 hours. BNP (last 3 results) No results for input(s): "PROBNP" in the last 8760 hours. HbA1C: No results for input(s): "HGBA1C" in the last 72 hours. CBG: No results for input(s): "GLUCAP" in the last 168 hours. Lipid Profile: No results for input(s): "CHOL", "HDL", "LDLCALC", "TRIG", "CHOLHDL", "LDLDIRECT" in the last 72 hours. Thyroid Function Tests: Recent Labs    07/08/2022 1833  TSH 70.637*  FREET4 0.44*    Anemia Panel: Recent Labs    07/19/22 1402   FERRITIN 48  TIBC 237*  IRON 129    Sepsis Labs: Recent Labs  Lab 07/16/2022 1845 07/29/2022 2025  LATICACIDVEN 2.7* 2.0*     Recent Results (from the past 240 hour(s))  Blood Culture (routine x 2)     Status: Abnormal (Preliminary result)   Collection Time: 07/22/2022  6:30 PM   Specimen: BLOOD  Result Value Ref Range Status   Specimen Description BLOOD SITE NOT SPECIFIED  Final   Special Requests   Final    BOTTLES DRAWN AEROBIC AND ANAEROBIC Blood Culture adequate volume   Culture  Setup Time   Final    GRAM POSITIVE COCCI IN BOTH AEROBIC AND ANAEROBIC BOTTLES CRITICAL RESULT CALLED TO, READ BACK BY AND VERIFIED WITH: [place] [id number] AT 2458 BY EC    Culture (A)  Final    ENTEROCOCCUS CASSELIFLAVUS STAPHYLOCOCCUS SIMULANS THE SIGNIFICANCE OF ISOLATING THIS ORGANISM FROM A SINGLE SET OF BLOOD CULTURES WHEN MULTIPLE SETS ARE DRAWN IS UNCERTAIN. PLEASE NOTIFY [place]  WITHIN ONE WEEK IF SPECIATION AND SENSITIVITIES ARE REQUIRED. SUSCEPTIBILITIES TO FOLLOW Performed at [facility], [place] [address], [place], [place] [address]    Report Status PENDING  Incomplete  Blood Culture (routine x 2)     Status: None (Preliminary result)   Collection Time: 07/22/2022  6:30 PM   Specimen: BLOOD  Result Value Ref Range Status   Specimen Description BLOOD SITE NOT SPECIFIED  Final   Special Requests   Final    BOTTLES DRAWN AEROBIC AND ANAEROBIC Blood Culture results may not be optimal due to an excessive volume of blood received in culture bottles   Culture   Final    NO GROWTH 2 DAYS Performed at [facility], [place] [address], [place], [place] [address]    Report Status PENDING  Incomplete  Blood Culture ID Panel (Reflexed)     Status: None   Collection Time: 07/26/2022  6:30 PM  Result Value Ref Range Status   Enterococcus faecalis NOT DETECTED NOT DETECTED Final   Enterococcus Faecium NOT DETECTED NOT DETECTED Final   Listeria monocytogenes NOT  DETECTED NOT DETECTED Final   Staphylococcus species NOT DETECTED NOT DETECTED Final   Staphylococcus aureus (BCID) NOT DETECTED NOT DETECTED Final   Staphylococcus epidermidis NOT DETECTED NOT DETECTED Final   Staphylococcus lugdunensis NOT DETECTED NOT DETECTED Final   Streptococcus species NOT DETECTED NOT DETECTED Final   Streptococcus agalactiae NOT DETECTED NOT DETECTED Final   Streptococcus pneumoniae NOT DETECTED NOT DETECTED Final   Streptococcus pyogenes NOT DETECTED NOT DETECTED Final   A.calcoaceticus-baumannii NOT DETECTED NOT DETECTED Final   Bacteroides fragilis NOT DETECTED NOT DETECTED Final   Enterobacterales NOT DETECTED NOT DETECTED Final   Enterobacter cloacae complex NOT DETECTED NOT DETECTED Final   Escherichia coli NOT DETECTED NOT DETECTED Final   Klebsiella aerogenes NOT DETECTED NOT DETECTED Final   Klebsiella oxytoca NOT DETECTED NOT DETECTED Final   Klebsiella pneumoniae NOT DETECTED NOT DETECTED Final   Proteus species NOT DETECTED NOT DETECTED Final   Salmonella species NOT DETECTED NOT DETECTED Final   Serratia marcescens NOT DETECTED NOT DETECTED Final   Haemophilus influenzae NOT DETECTED NOT DETECTED Final   Neisseria meningitidis NOT DETECTED NOT DETECTED Final   Pseudomonas aeruginosa NOT DETECTED NOT DETECTED Final   Stenotrophomonas maltophilia NOT DETECTED NOT DETECTED Final   Candida albicans NOT DETECTED NOT DETECTED Final   Candida auris NOT DETECTED NOT DETECTED Final   Candida glabrata NOT DETECTED NOT DETECTED Final   Candida krusei NOT DETECTED NOT DETECTED Final   Candida parapsilosis NOT DETECTED NOT DETECTED Final   Candida tropicalis NOT DETECTED NOT DETECTED Final   Cryptococcus neoformans/gattii NOT DETECTED NOT DETECTED Final    Comment: Performed at [facility], [address], [place], [place] [address]  Urine Culture     Status: Abnormal   Collection Time: 07/26/2022  6:33 PM   Specimen: Urine, Clean Catch  Result Value  Ref Range Status   Specimen Description URINE, CLEAN CATCH  Final   Special Requests   Final    NONE Performed at [facility], [place] [address] [place], [place] [address]    Culture >=100,000 COLONIES/mL ESCHERICHIA COLI (A)  Final   Report Status 07/21/2022 FINAL  Final   Organism ID, Bacteria ESCHERICHIA COLI (A)  Final      Susceptibility   Escherichia coli - MIC*    AMPICILLIN 4 SENSITIVE Sensitive     CEFAZOLIN <=4  SENSITIVE Sensitive     CEFEPIME <=0.12 SENSITIVE Sensitive     CEFTRIAXONE <=0.25 SENSITIVE Sensitive     CIPROFLOXACIN <=0.25 SENSITIVE Sensitive     GENTAMICIN <=1 SENSITIVE Sensitive     IMIPENEM <=0.25 SENSITIVE Sensitive     NITROFURANTOIN <=16 SENSITIVE Sensitive     TRIMETH/SULFA <=20 SENSITIVE Sensitive     AMPICILLIN/SULBACTAM <=2 SENSITIVE Sensitive     PIP/TAZO <=4 SENSITIVE Sensitive     * >=100,000 COLONIES/mL ESCHERICHIA COLI         Radiology Studies: No results found.  Scheduled Meds:  enoxaparin (LOVENOX) injection  30 mg Subcutaneous Daily   levothyroxine  25 mcg Oral Q0600   Continuous Infusions:  cefTRIAXone (ROCEPHIN)  IV 2 g (07/20/22 1758)     LOS: 3 days   Time spent: 16mn  Perla Echavarria C Banita Lehn, DO [facility]  If 7PM-7AM, please contact night-coverage [email or web address]  07/21/2022, 7:50 AM

## 2022-07-21 NOTE — Care Management Important Message (Signed)
Important Message  Patient Details  Name: Vanessa Clayton MRN: [id number] Date of Birth: 09-05-1925   Medicare Important Message Given:  Yes  Due to illness patient was not able to sign.  Sign copy left.   Aianna Fahs 07/21/2022, 3:30 PM

## 2022-07-22 ENCOUNTER — Inpatient Hospital Stay (HOSPITAL_COMMUNITY): Payer: Medicare PPO

## 2022-07-22 DIAGNOSIS — R627 Adult failure to thrive: Secondary | ICD-10-CM | POA: Diagnosis not present

## 2022-07-22 DIAGNOSIS — E039 Hypothyroidism, unspecified: Secondary | ICD-10-CM | POA: Diagnosis present

## 2022-07-22 LAB — COMPREHENSIVE METABOLIC PANEL
ALT: 11 U/L (ref 0–44)
AST: 44 U/L — ABNORMAL HIGH (ref 15–41)
Albumin: 1.6 g/dL — ABNORMAL LOW (ref 3.5–5.0)
Alkaline Phosphatase: 84 U/L (ref 38–126)
Anion gap: 12 (ref 5–15)
BUN: 17 mg/dL (ref 8–23)
CO2: 15 mmol/L — ABNORMAL LOW (ref 22–32)
Calcium: 7.7 mg/dL — ABNORMAL LOW (ref 8.9–10.3)
Chloride: 112 mmol/L — ABNORMAL HIGH (ref 98–111)
Creatinine, Ser: 0.95 mg/dL (ref 0.44–1.00)
GFR, Estimated: 56 mL/min — ABNORMAL LOW (ref >60)
Glucose, Bld: 115 mg/dL — ABNORMAL HIGH (ref 70–99)
Potassium: 3.1 mmol/L — ABNORMAL LOW (ref 3.5–5.1)
Sodium: 139 mmol/L (ref 135–145)
Total Bilirubin: UNDETERMINED mg/dL (ref 0.3–1.2)
Total Protein: 5.9 g/dL — ABNORMAL LOW (ref 6.5–8.1)

## 2022-07-22 LAB — CULTURE, BLOOD (ROUTINE X 2): Special Requests: ADEQUATE

## 2022-07-22 LAB — BLOOD GAS, ARTERIAL
Acid-base deficit: 10.3 mmol/L — ABNORMAL HIGH (ref 0.0–2.0)
Bicarbonate: 19.1 mmol/L — ABNORMAL LOW (ref 20.0–28.0)
O2 Saturation: 98.1 %
Patient temperature: 36.2
pCO2 arterial: 54 mmHg — ABNORMAL HIGH (ref 32–48)
pH, Arterial: 7.15 — CL (ref 7.35–7.45)
pO2, Arterial: 305 mmHg — ABNORMAL HIGH (ref 83–108)

## 2022-07-22 LAB — GLUCOSE, CAPILLARY
Glucose-Capillary: 200 mg/dL — ABNORMAL HIGH (ref 70–99)
Glucose-Capillary: 54 mg/dL — ABNORMAL LOW (ref 70–99)
Glucose-Capillary: 89 mg/dL (ref 70–99)

## 2022-07-22 LAB — CBC
HCT: 30.3 % — ABNORMAL LOW (ref 36.0–46.0)
Hemoglobin: 9.5 g/dL — ABNORMAL LOW (ref 12.0–15.0)
MCH: 25.3 pg — ABNORMAL LOW (ref 26.0–34.0)
MCHC: 31.4 g/dL (ref 30.0–36.0)
MCV: 80.6 fL (ref 80.0–100.0)
Platelets: 79 10*3/uL — ABNORMAL LOW (ref 150–400)
RBC: 3.76 MIL/uL — ABNORMAL LOW (ref 3.87–5.11)
RDW: 23.6 % — ABNORMAL HIGH (ref 11.5–15.5)
WBC: 7.6 10*3/uL (ref 4.0–10.5)
nRBC: 6.3 % — ABNORMAL HIGH (ref 0.0–0.2)

## 2022-07-22 MED ORDER — POTASSIUM CHLORIDE 10 MEQ/100ML IV SOLN
10.0000 meq | INTRAVENOUS | Status: AC
  Administered 2022-07-22 (×3): 10 meq via INTRAVENOUS
  Filled 2022-07-22: qty 100

## 2022-07-22 MED ORDER — SODIUM BICARBONATE 8.4 % IV SOLN
Freq: Once | INTRAVENOUS | Status: AC
  Filled 2022-07-22: qty 1000

## 2022-07-22 MED ORDER — DEXTROSE 50 % IV SOLN
INTRAVENOUS | Status: AC
  Filled 2022-07-22: qty 50

## 2022-07-22 MED ORDER — POTASSIUM CHLORIDE 10 MEQ/100ML IV SOLN
10.0000 meq | INTRAVENOUS | Status: AC
  Administered 2022-07-23 (×2): 10 meq via INTRAVENOUS
  Filled 2022-07-22 (×3): qty 100

## 2022-07-22 MED ORDER — DEXTROSE 50 % IV SOLN: 1.0000 | Freq: Once | INTRAVENOUS | Status: AC

## 2022-07-22 NOTE — Progress Notes (Signed)
Critical lab value blood gas pH 7.15. Dr. Avon Gully notified new orders received

## 2022-07-22 NOTE — Progress Notes (Signed)
Patient noted to be unresponsive upon rounding. Vital sign stable except oxygen level unable to obtain. Rapid response nurse notified and arrived at bedside to assist with care of patient. Dr. Avon Gully notified of situation and new orders received. Please see Rapid response note for further details.

## 2022-07-22 NOTE — Significant Event (Signed)
Rapid Response Event Note   Reason for Call :  Unresponsive, unable to obtain O2 sat  Initial Focused Assessment:  Unresponsive to all painful stim.  She does blink and will clamp down on oral sution. Lung sounds very diminished Heart tones rapid Cool and dry to the touch  BP 122/71  ST 118-125  RR 6  O2 sat 51% on RA Axillary temp 96.3 CBG 54  Interventions:  Placed on NRB 1 amp D50 ABG Labs PCXR Warm blankets  Reassessment BP 134/85  ST 107-114  RR 11  O2 sat 100% on NRB Weaned to 6L [place] O2 sat 100% She will move her hands spontaneously, but does not respond to staff CBG 200  ABG resulted: 7.15/54/305/10 Order received for sodium bicarb gtt   Plan of Care:  Sodium bicarb gtt Follow up labs and temperature Wean O2 per O2 sats   Event Summary:   MD Notified: Avon Gully Call Time: [place] Time: 9147 End Time: [place]  Raliegh Ip, RN

## 2022-07-22 NOTE — Progress Notes (Signed)
Speech Language Pathology Treatment: Dysphagia  Patient Details Name: RONNE SAVOIA MRN: [id number] DOB: 23-Aug-1926 Today's Date: 07/22/2022 Time: 0165-5374 SLP Time Calculation (min) (ACUTE ONLY): 25 min  Assessment / Plan / Recommendation Clinical Impression  Pt overall resistant to PO intake this am. NT/RN report pt expectorating all consistencies from am meal (puree, mechanical soft solids, liquids). During skilled SLP session, she was alert but with notable reduced attention to PO intake, requiring max verbal/tactile/verbal cues to attend to oral acceptance and clearance across all trials. True dys 3 solid (soft deli sandwich) significant for prolonged mastication and trace-mild oral residuals, cleared with cued liquid wash. Simulated dys 2 solids reduced mastication and oral clearance time, although trace residuals remained, but also able to be cleared via sip of thin liquid. Frequent talking during PO consumption likely impacted oral prep/mastication and clearance time, but pt also with several missing dentition and this may also play a role. Softer meal items may be more functional and safe for her at this time. Will downgrade diet to dys 2, however suspect meal intake may remain poor if preferred meal items are not offered. Discussed with RN/NT. Will f/u.   HPI HPI: BETSAIDA MISSOU[place] is a 88 y.o. female who presented to [facility] ED from home due to altered mental status and significant decrease in oral intake, acute anemia. Dx include failure to thrive in adult, severe protein calorie malnutrition, acute metabolic encephalopathy. PMHx includes hypertension, hyperlipidemia, thyrotoxicosis, chronic diastolic CHF, TIA in 8270, unintentional weight loss, CKD 3B.      SLP Plan  Continue with current plan of care      Recommendations for follow up therapy are one component of a multi-disciplinary discharge planning process, led by the attending physician.  Recommendations may be updated based on patient  status, additional functional criteria and insurance authorization.    Recommendations  Diet recommendations: Dysphagia 2 (fine chop);Thin liquid Liquids provided via: Straw;Cup Medication Administration: Crushed with puree Supervision: Staff to assist with self feeding;Full supervision/cueing for compensatory strategies Compensations: Minimize environmental distractions;Slow rate;Small sips/bites;Follow solids with liquid Postural Changes and/or Swallow Maneuvers: Seated upright 90 degrees                Oral Care Recommendations: Oral care BID Follow Up Recommendations: No SLP follow up Assistance recommended at discharge: Frequent or constant Supervision/Assistance SLP Visit Diagnosis: Dysphagia, unspecified (R13.10) Plan: Continue with current plan of care           [place] Dense, [place], [place] Office Number: [place]  07/22/2022, 10:33 AM

## 2022-07-22 NOTE — Progress Notes (Addendum)
PROGRESS NOTE    [name]  XBM:[id number] DOB: 1926-04-06 DOA: 07/31/2022 PCP: Biagio Borg, MD   Brief Narrative:  Vanessa Clayton is a 88 y.o. female with medical history significant for hypertension, hyperlipidemia, thyrotoxicosis, chronic diastolic CHF, TIA in 0272, unintentional weight loss, CKD 3B, who presented to [facility] [place] ED from home due to altered mental status and significant decrease in oral intake, for the past 2 days.  Noted to be profoundly anemic, hypovolemic with positive FOBT in the ED.  UA suggestive for UTI.  Hospitalist called for admission.   Assessment & Plan:   Principal Problem:   Severe hypothyroidism Active Problems:   Hypercholesterolemia   Depression   Weight loss, non-intentional   Orthostatic hypotension   Essential hypertension   Anemia   Bilateral hearing loss   Hyperglycemia   Vitamin D deficiency   Hypertension, essential   Failure to thrive in adult   Pressure injury of skin  ------------------------------------------------------ **Rapid response Tuesday evening - Patient minimally/unresponsive per nurse - family had just left and were feeding the patient. Concern for aspiration given her mental status. CXR/ABG ordered. Not tachypneic, mild tachycardia but otherwise stable. - She is DNR so supplemental oxygen was provided but her code status was discussed in case she began to decompensate. - Likely patient continues to suffer from hypothyroid crisis. Her POC glucose was in 50s which is likely contributing. D50 given - Labs pending to rule out other etiology. EKG revealed sinus tachycardia. -Wean oxygen down on nasal cannula from NRB. -Repeat glucose after bolus = 200 with no improvement in mentation. -Mixed acidosis on ABG 7.15 - both respiratory and metabolic acidosis noted - bicarb drip initiated x1L -Rapid labs (CBC/CMP) are remarkable for hypokalemia - will replete '40mg'$  K iv x1; otherwise not unchanged from am  labs -----------------------------------------------------   Hypothyroid crisis, improving -Family indicate patient has not taken thyroid medication (upwards of 6 months) unclear accuracy -TSH >70, T4 0.4 -Initiate IV levothyroxine x2 -improving somewhat over the last 24 hours -Start higher dose p.o. levothyroxine 100 x 2 days transition to 69mg thereafter (previously on 25) -Repeat thyroid testing outpatient in 3 to 4 weeks, adjustments likely to follow  Acute metabolic encephalopathy, likely multifactorial, resolving - In the setting of above as well as concurrent hypovolemia, hypotension, profound anemia, failure to thrive and UTI -Improved drastically over the past 48 hours, today she is awake alert oriented to person only but much more interactive and following commands  Sepsis secondary to UTI, POA Leukocytosis, tachypnea, hypothermic with notable source given abnormal UA Ceftriaxone completed, blood culture preliminary positive, likely contaminant given staph, will follow along  Failure to thrive in adult Severe protein calorie malnutrition Severe muscle mass loss noted on exam Poor oral intake reported at intake at baseline Transitioned off IV fluids, increase p.o. as tolerated There is no height or weight on file to calculate BMI.   Anemia, likely acute on chronic Multifactorial anemia of chronic disease/microcytic anemia/iron deficiency/failure to thrive/malnutrition Thrombocytopenia, chronic Hemoglobin 5.1 on presentation with positive FOBT. SP 2u PRBC - hemoglobin otherwise stable [facility] signed off; no current indication for endoscopy    Component Value Date/Time   IRON 129 07/19/2022 1402   TIBC 237 (L) 07/19/2022 1402   FERRITIN 48 07/19/2022 1402   IRONPCTSAT 55 (H) 07/19/2022 1402   Non-anion gap metabolic acidosis In the setting of above Secondary to lactic acidosis, dehydration and failure to thrive Bicarb added as above x1L   CKD 3B At  baseline,  continue IV fluids/supportive care as above   Thrombocytopenia, unclear etiology Acute, previous labs within normal limits Discontinue Lovenox, likely complicating patient's questionable GI bleed and profound anemia given above  DVT prophylaxis: Discontinue Lovenox -SCDs only given above Code Status: DNR Family Communication: Discussed with daughter over phone  Status is: Inpatient  Dispo: The patient is from: Home              Anticipated d/c is to: To be determined -likely home to her previous level of care              Anticipated d/c date is: 24-48 hours              Patient currently not medically stable for discharge  Consultants:  GI  Procedures:  None planned  Antimicrobials:  Ceftriaxone completed  Subjective: No acute issues or events overnight, review of systems limited given patient's mental status/hearing difficulty  Objective: Vitals:   07/22/22 1400 07/22/22 1600 07/22/22 1638 07/22/22 1700  BP: (!) 134/99 127/75 122/71 134/85  Pulse:      Resp: (!) 0 16 (!) 6 11  Temp:      TempSrc:      SpO2:   (!) 51% 100%    Intake/Output Summary (Last 24 hours) at 07/22/2022 1807 Last data filed at 07/21/2022 2200 Gross per 24 hour  Intake --  Output 100 ml  Net -100 ml   There were no vitals filed for this visit.  Examination:  General:  Pleasantly resting in bed, No acute distress.  HEENT:  Normocephalic atraumatic.  Sclerae nonicteric, noninjected.  Extraocular movements intact bilaterally. Neck:  Without mass or deformity.  Trachea is midline. Lungs:  Clear to auscultate bilaterally without rhonchi, wheeze, or rales. Heart:  Regular rate and rhythm.  Without murmurs, rubs, or gallops. Abdomen:  Soft, nontender, nondistended.  Without guarding or rebound. Extremities: Without cyanosis, clubbing, edema, or obvious deformity. Vascular:  Dorsalis pedis and posterior tibial pulses palpable bilaterally. Skin:  Warm and dry, no erythema, sacral decubitus  ulcer noted at intake   Data Reviewed: I have personally reviewed following labs and imaging studies  CBC: Recent Labs  Lab 07/13/2022 1833 07/19/22 0722 07/21/22 0533  WBC 10.6* 8.0 8.1  NEUTROABS 7.8* 6.2  --   HGB 5.1* 8.8* 11.1*  HCT 16.2* 25.9* 33.6*  MCV 68.9* 76.4* 77.4*  PLT 95* 67* 83*   Basic Metabolic Panel: Recent Labs  Lab 07/31/2022 1833 07/19/22 0722 07/21/22 0533  NA 137 139 138  K 4.0 4.0 3.4*  CL 104 111 109  CO2 21* 20* 18*  GLUCOSE 73 68* 86  BUN '20 20 17  '$ CREATININE 1.11* 0.99 0.96  CALCIUM 8.5* 7.8* 7.7*  MG 1.9 1.8  --   PHOS  --  3.1  --    GFR: CrCl cannot be calculated (Unknown ideal weight.). Liver Function Tests: Recent Labs  Lab 07/17/2022 1833 07/19/22 0722 07/21/22 0533  AST 67* 61* 50*  ALT '15 12 12  '$ ALKPHOS 96 80 87  BILITOT 0.8 0.5 0.2*  PROT 6.9 6.2* 6.6  ALBUMIN 2.0* 1.8* 1.8*   No results for input(s): "LIPASE", "AMYLASE" in the last 168 hours. Recent Labs  Lab 07/19/22 0722  AMMONIA 30   Coagulation Profile: Recent Labs  Lab 07/20/2022 1845  INR 1.1   Cardiac Enzymes: No results for input(s): "CKTOTAL", "CKMB", "CKMBINDEX", "TROPONINI" in the last 168 hours. BNP (last 3 results) No results for input(s): "PROBNP" in the  last 8760 hours. HbA1C: No results for input(s): "HGBA1C" in the last 72 hours. CBG: Recent Labs  Lab 07/22/22 1640 07/22/22 1656  GLUCAP 54* 200*   Lipid Profile: No results for input(s): "CHOL", "HDL", "LDLCALC", "TRIG", "CHOLHDL", "LDLDIRECT" in the last 72 hours. Thyroid Function Tests: No results for input(s): "TSH", "T4TOTAL", "FREET4", "T3FREE", "THYROIDAB" in the last 72 hours.  Anemia Panel: No results for input(s): "VITAMINB12", "FOLATE", "FERRITIN", "TIBC", "IRON", "RETICCTPCT" in the last 72 hours. Sepsis Labs: Recent Labs  Lab 07/21/2022 1845 07/29/2022 2025  LATICACIDVEN 2.7* 2.0*    Recent Results (from the past 240 hour(s))  Blood Culture (routine x 2)     Status:  Abnormal   Collection Time: 07/11/2022  6:30 PM   Specimen: BLOOD  Result Value Ref Range Status   Specimen Description BLOOD SITE NOT SPECIFIED  Final   Special Requests   Final    BOTTLES DRAWN AEROBIC AND ANAEROBIC Blood Culture adequate volume   Culture  Setup Time   Final    GRAM POSITIVE COCCI IN BOTH AEROBIC AND ANAEROBIC BOTTLES CRITICAL RESULT CALLED TO, READ BACK BY AND VERIFIED WITH: [place] [id number] AT 8110 BY EC    Culture (A)  Final    ENTEROCOCCUS CASSELIFLAVUS STAPHYLOCOCCUS SIMULANS THE SIGNIFICANCE OF ISOLATING THIS ORGANISM FROM A SINGLE SET OF BLOOD CULTURES WHEN MULTIPLE SETS ARE DRAWN IS UNCERTAIN. PLEASE NOTIFY THE MICROBIOLOGY DEPARTMENT WITHIN ONE WEEK IF SPECIATION AND SENSITIVITIES ARE REQUIRED. Performed at [facility], [place] [address] [place], [place] [address]    Report Status 07/22/2022 FINAL  Final   Organism ID, Bacteria ENTEROCOCCUS CASSELIFLAVUS  Final      Susceptibility   Enterococcus casseliflavus - MIC*    AMPICILLIN <=2 SENSITIVE Sensitive     VANCOMYCIN RESISTANT Resistant     GENTAMICIN SYNERGY SENSITIVE Sensitive     LINEZOLID 1 SENSITIVE Sensitive     * ENTEROCOCCUS CASSELIFLAVUS  Blood Culture (routine x 2)     Status: None (Preliminary result)   Collection Time: 07/08/2022  6:30 PM   Specimen: BLOOD  Result Value Ref Range Status   Specimen Description BLOOD SITE NOT SPECIFIED  Final   Special Requests   Final    BOTTLES DRAWN AEROBIC AND ANAEROBIC Blood Culture results may not be optimal due to an excessive volume of blood received in culture bottles   Culture   Final    NO GROWTH 4 DAYS Performed at [facility], [place] [address] [place], [place] [address]    Report Status PENDING  Incomplete  Blood Culture ID Panel (Reflexed)     Status: None   Collection Time: 07/15/2022  6:30 PM  Result Value Ref Range Status   Enterococcus faecalis NOT DETECTED NOT DETECTED Final   Enterococcus Faecium NOT DETECTED NOT DETECTED  Final   Listeria monocytogenes NOT DETECTED NOT DETECTED Final   Staphylococcus species NOT DETECTED NOT DETECTED Final   Staphylococcus aureus (BCID) NOT DETECTED NOT DETECTED Final   Staphylococcus epidermidis NOT DETECTED NOT DETECTED Final   Staphylococcus lugdunensis NOT DETECTED NOT DETECTED Final   Streptococcus species NOT DETECTED NOT DETECTED Final   Streptococcus agalactiae NOT DETECTED NOT DETECTED Final   Streptococcus pneumoniae NOT DETECTED NOT DETECTED Final   Streptococcus pyogenes NOT DETECTED NOT DETECTED Final   A.calcoaceticus-baumannii NOT DETECTED NOT DETECTED Final   Bacteroides fragilis NOT DETECTED NOT DETECTED Final   Enterobacterales NOT DETECTED NOT DETECTED Final   Enterobacter cloacae complex NOT DETECTED NOT  DETECTED Final   Escherichia coli NOT DETECTED NOT DETECTED Final   Klebsiella aerogenes NOT DETECTED NOT DETECTED Final   Klebsiella oxytoca NOT DETECTED NOT DETECTED Final   Klebsiella pneumoniae NOT DETECTED NOT DETECTED Final   Proteus species NOT DETECTED NOT DETECTED Final   Salmonella species NOT DETECTED NOT DETECTED Final   Serratia marcescens NOT DETECTED NOT DETECTED Final   Haemophilus influenzae NOT DETECTED NOT DETECTED Final   Neisseria meningitidis NOT DETECTED NOT DETECTED Final   Pseudomonas aeruginosa NOT DETECTED NOT DETECTED Final   Stenotrophomonas maltophilia NOT DETECTED NOT DETECTED Final   Candida albicans NOT DETECTED NOT DETECTED Final   Candida auris NOT DETECTED NOT DETECTED Final   Candida glabrata NOT DETECTED NOT DETECTED Final   Candida krusei NOT DETECTED NOT DETECTED Final   Candida parapsilosis NOT DETECTED NOT DETECTED Final   Candida tropicalis NOT DETECTED NOT DETECTED Final   Cryptococcus neoformans/gattii NOT DETECTED NOT DETECTED Final    Comment: Performed at [facility], [place] [address] [place], [place] [address]  Urine Culture     Status: Abnormal   Collection Time: 07/17/2022  6:33 PM    Specimen: Urine, Clean Catch  Result Value Ref Range Status   Specimen Description URINE, CLEAN CATCH  Final   Special Requests   Final    NONE Performed at [facility], [place] [address] [place], [place] [address]    Culture >=100,000 COLONIES/mL ESCHERICHIA COLI (A)  Final   Report Status 07/21/2022 FINAL  Final   Organism ID, Bacteria ESCHERICHIA COLI (A)  Final      Susceptibility   Escherichia coli - MIC*    AMPICILLIN 4 SENSITIVE Sensitive     CEFAZOLIN <=4 SENSITIVE Sensitive     CEFEPIME <=0.12 SENSITIVE Sensitive     CEFTRIAXONE <=0.25 SENSITIVE Sensitive     CIPROFLOXACIN <=0.25 SENSITIVE Sensitive     GENTAMICIN <=1 SENSITIVE Sensitive     IMIPENEM <=0.25 SENSITIVE Sensitive     NITROFURANTOIN <=16 SENSITIVE Sensitive     TRIMETH/SULFA <=20 SENSITIVE Sensitive     AMPICILLIN/SULBACTAM <=2 SENSITIVE Sensitive     PIP/TAZO <=4 SENSITIVE Sensitive     * >=100,000 COLONIES/mL ESCHERICHIA COLI  Culture, blood (Routine X 2) w Reflex to ID Panel     Status: None (Preliminary result)   Collection Time: 07/21/22 12:12 PM   Specimen: BLOOD  Result Value Ref Range Status   Specimen Description BLOOD LEFT ANTECUBITAL  Final   Special Requests   Final    BOTTLES DRAWN AEROBIC AND ANAEROBIC Blood Culture adequate volume   Culture   Final    NO GROWTH < 24 HOURS Performed at [facility], [address], [place], [place] [address]    Report Status PENDING  Incomplete  Culture, blood (Routine X 2) w Reflex to ID Panel     Status: None (Preliminary result)   Collection Time: 07/21/22 12:14 PM   Specimen: BLOOD LEFT HAND  Result Value Ref Range Status   Specimen Description BLOOD LEFT HAND  Final   Special Requests   Final    BOTTLES DRAWN AEROBIC ONLY Blood Culture adequate volume   Culture   Final    NO GROWTH < 24 HOURS Performed at [facility], [place] [address] [place], [place] [address]    Report Status PENDING  Incomplete         Radiology  Studies: No results found.  Scheduled Meds:  dextrose  enoxaparin (LOVENOX) injection  30 mg Subcutaneous Daily   levothyroxine  100 mcg Oral Q0600   [START ON August 10, 2022] levothyroxine  25 mcg Oral Q0600   sodium bicarbonate 150 mEq in dextrose 5 % 1,150 mL infusion   Intravenous Once   Continuous Infusions:     LOS: 4 days   Time spent: 20mn  Arika Mainer C Rishita Petron, DO [facility]  If 7PM-7AM, please contact night-coverage [email or web address]  07/22/2022, 6:07 PM

## 2022-07-22 NOTE — Progress Notes (Signed)
Writer called and updated patient's daughter Ivery Quale of patients current mentation and plan of care.

## 2022-07-22 NOTE — Progress Notes (Signed)
Hypoglycemic Event  CBG: 54  Treatment: D50 50 mL (25 gm)  Symptoms: unresponsive   Follow-up CBG: Time: 1656 CBG Result:200  Possible Reasons for Event: inadequate meal intake/unknown   Comments/MD notified: Dr. Keane Scrape, [place]

## 2022-07-23 ENCOUNTER — Inpatient Hospital Stay (HOSPITAL_COMMUNITY): Payer: Medicare PPO

## 2022-07-23 DIAGNOSIS — E039 Hypothyroidism, unspecified: Secondary | ICD-10-CM

## 2022-07-23 LAB — CBC
HCT: 31.2 % — ABNORMAL LOW (ref 36.0–46.0)
Hemoglobin: 10.2 g/dL — ABNORMAL LOW (ref 12.0–15.0)
MCH: 25.8 pg — ABNORMAL LOW (ref 26.0–34.0)
MCHC: 32.7 g/dL (ref 30.0–36.0)
MCV: 79 fL — ABNORMAL LOW (ref 80.0–100.0)
Platelets: 92 10*3/uL — ABNORMAL LOW (ref 150–400)
RBC: 3.95 MIL/uL (ref 3.87–5.11)
RDW: 24.1 % — ABNORMAL HIGH (ref 11.5–15.5)
WBC: 9.9 10*3/uL (ref 4.0–10.5)
nRBC: 3.6 % — ABNORMAL HIGH (ref 0.0–0.2)

## 2022-07-23 LAB — CULTURE, BLOOD (ROUTINE X 2): Culture: NO GROWTH

## 2022-07-23 LAB — COMPREHENSIVE METABOLIC PANEL
ALT: 14 U/L (ref 0–44)
AST: 54 U/L — ABNORMAL HIGH (ref 15–41)
Albumin: 1.8 g/dL — ABNORMAL LOW (ref 3.5–5.0)
Alkaline Phosphatase: 88 U/L (ref 38–126)
Anion gap: 11 (ref 5–15)
BUN: 15 mg/dL (ref 8–23)
CO2: 19 mmol/L — ABNORMAL LOW (ref 22–32)
Calcium: 7.6 mg/dL — ABNORMAL LOW (ref 8.9–10.3)
Chloride: 106 mmol/L (ref 98–111)
Creatinine, Ser: 1 mg/dL (ref 0.44–1.00)
GFR, Estimated: 52 mL/min — ABNORMAL LOW (ref >60)
Glucose, Bld: 146 mg/dL — ABNORMAL HIGH (ref 70–99)
Potassium: 3.6 mmol/L (ref 3.5–5.1)
Sodium: 136 mmol/L (ref 135–145)
Total Bilirubin: 0.4 mg/dL (ref 0.3–1.2)
Total Protein: 6.1 g/dL — ABNORMAL LOW (ref 6.5–8.1)

## 2022-07-23 LAB — GLUCOSE, CAPILLARY: Glucose-Capillary: 144 mg/dL — ABNORMAL HIGH (ref 70–99)

## 2022-07-23 MED ORDER — ACETAMINOPHEN 650 MG RE SUPP: 650.0000 mg | Freq: Four times a day (QID) | RECTAL | Status: DC | PRN

## 2022-07-23 MED ORDER — SODIUM CHLORIDE 0.9 % IV SOLN: INTRAVENOUS | Status: DC

## 2022-07-23 MED ORDER — LORAZEPAM 2 MG/ML IJ SOLN: 2.0000 mg | INTRAMUSCULAR | Status: DC | PRN

## 2022-07-23 MED ORDER — FUROSEMIDE 10 MG/ML IJ SOLN: 40.0000 mg | Freq: Once | INTRAMUSCULAR | Status: DC

## 2022-07-23 MED ORDER — ONDANSETRON 4 MG PO TBDP: 4.0000 mg | ORAL_TABLET | Freq: Four times a day (QID) | ORAL | Status: DC | PRN

## 2022-07-23 MED ORDER — GLYCOPYRROLATE 1 MG PO TABS: 1.0000 mg | ORAL_TABLET | ORAL | Status: DC | PRN

## 2022-07-23 MED ORDER — GLYCOPYRROLATE 0.2 MG/ML IJ SOLN: 0.2000 mg | INTRAMUSCULAR | Status: DC | PRN

## 2022-07-23 MED ORDER — ONDANSETRON HCL 4 MG/2ML IJ SOLN: 4.0000 mg | Freq: Four times a day (QID) | INTRAMUSCULAR | Status: DC | PRN

## 2022-07-23 MED ORDER — MORPHINE SULFATE (PF) 2 MG/ML IV SOLN: 2.0000 mg | INTRAVENOUS | Status: DC | PRN

## 2022-07-23 MED ORDER — COSYNTROPIN 0.25 MG IJ SOLR: 0.2500 mg | Freq: Once | INTRAMUSCULAR | Status: DC

## 2022-07-23 MED ORDER — LEVOTHYROXINE SODIUM 100 MCG/5ML IV SOLN
75.0000 ug | Freq: Every day | INTRAVENOUS | Status: DC
  Filled 2022-07-23: qty 5

## 2022-07-23 MED ORDER — POLYVINYL ALCOHOL 1.4 % OP SOLN: 1.0000 [drp] | Freq: Four times a day (QID) | OPHTHALMIC | Status: DC | PRN

## 2022-07-23 MED ORDER — ACETAMINOPHEN 325 MG PO TABS: 650.0000 mg | ORAL_TABLET | Freq: Four times a day (QID) | ORAL | Status: DC | PRN

## 2022-07-23 MED ORDER — HYDROCORTISONE SOD SUC (PF) 100 MG IJ SOLR
100.0000 mg | Freq: Three times a day (TID) | INTRAMUSCULAR | Status: DC
  Filled 2022-07-23: qty 2

## 2022-07-23 MED ORDER — THIAMINE HCL 100 MG/ML IJ SOLN
500.0000 mg | Freq: Every day | INTRAVENOUS | Status: DC
  Filled 2022-07-23: qty 5

## 2022-07-23 NOTE — Progress Notes (Addendum)
2115 pt has expired, verified by Myrtie Neither, RN and Britta Mccreedy, RN. Irene Pap, DO notified.

## 2022-07-23 NOTE — Progress Notes (Signed)
TOC following to determine if patient is appropriate for hospice facility. Await palliative consult. Of note, if patient is not requiring symptom management, she may not be eligible for [facility] but may qualify for [facility].   Gilmore Laroche, MSW, [facility]

## 2022-07-23 NOTE — Progress Notes (Signed)
PROGRESS NOTE    Vanessa Clayton  EGB:[id number] DOB: 06/29/1926 DOA: 07/19/2022 PCP: Biagio Borg, MD   Brief Narrative:  Vanessa Clayton is a 88 y.o. female with medical history significant for hypertension, hyperlipidemia, thyrotoxicosis, chronic diastolic CHF, TIA in 3710, unintentional weight loss, CKD 3B, who presented to [facility] ED from home due to altered mental status and significant decrease in oral intake, for the past 2 days.  Noted to be profoundly anemic, hypovolemic with positive FOBT in the ED.  UA suggestive for UTI.  Hospitalist called for admission.   Assessment & Plan:   Principal Problem:   Severe hypothyroidism Active Problems:   Hypercholesterolemia   Depression   Weight loss, non-intentional   Orthostatic hypotension   Essential hypertension   Anemia   Bilateral hearing loss   Hyperglycemia   Vitamin D deficiency   Hypertension, essential   Failure to thrive in adult   Pressure injury of skin   Stage 3a chronic kidney disease ([place])  # End of life care Here with possible hypothyroid coma, hypoxic respiratory failure, failure to thrive. No meaningful progress over past 4 days, remains unresponsive, requiring high levels of oxygen. Discussed with family little chance of meaningful recovery, discussed comfort based approach and they agreed. - transition to comfort care  # Hypoxic respiratory failure Worsening O2 today requiring non-rebreather. CXR yesterday w/ pulm edema. Transitioning to comfort care today  # Hypothyroidism, possible crisis TSH very elevated, T4 low. Received IV levothyroxine 200 x2 - d/c IV levothyroxine and hold on plan for high dose hydrocort  # Acute encephalopathy Multifactorial. Remains significantly altered. Glucose wnl today. Transitioning to comfort care today as above, deferring additional w/u (mri, eeg, high dose thiamine, b12/rpr/hiv)  # UTI? Completed course of ceftriaxone. Culture growing pan sensitive e coli  #  Bacteremia One set of BCs growing enterococcus casseliflavus and staph simulans. Repeat cultures negative - will query ID pharm today about significance  # Anemia Acute on chronic. No overt bleeding here. Hgb 5 on presentation, transfused 2 units. Hgb stable since. GI has seen and signed off, too high risk for a procedure, advises monitoring  # CKD 3b Stable  # Thrombocytopenia Plts somewhat improved today to 90s - defer further w/u with smear, hiv, hcv    DVT prophylaxis: Discontinue Lovenox -SCDs only given above Code Status: DNR Family Communication: Discussed with daughter and her husband over phone  Status is: Inpatient  Dispo: The patient is from: Home. Dispo tbd  Consultants:  GI  Procedures:  None planned  Antimicrobials:  Ceftriaxone completed  Subjective: This morning higher o2 need, no respiratory distress. Not communicative or responsive  Objective: Vitals:   07/22/22 2300 07/26/2022 0300 07/19/2022 0500 07/05/2022 0900  BP: 116/81 125/73 122/73   Pulse: 92 (!) 105 (!) 121   Resp: '14 14 14   '$ Temp: 97.6 F (36.4 C) 97.7 F (36.5 C)  (!) 96.7 F (35.9 C)  TempSrc: Oral Oral  Axillary  SpO2: 100% 100% 100%     Intake/Output Summary (Last 24 hours) at 07/09/2022 0946 Last data filed at 07/07/2022 0500 Gross per 24 hour  Intake 196.28 ml  Output 300 ml  Net -103.72 ml   There were no vitals filed for this visit.  Examination:  General:  Pleasantly resting in bed, No acute distress. Chronically ill appearing HEENT:  Normocephalic atraumatic.  Sclerae nonicteric, noninjected.  Extraocular movements intact bilaterally. Neck:  Without mass or deformity.  Trachea is midline. Lungs:  rales at bases Heart:  Regular rate and rhythm.  Without murmurs, rubs, or gallops. Abdomen:  Soft, nontender, nondistended.  Without guarding or rebound. Extremities: trace LE edema Vascular:  Dorsalis pedis and posterior tibial pulses palpable bilaterally. Skin:  Warm and  dry, no erythema, sacral decubitus ulcer noted at intake Neuro: not responsive, starting off   Data Reviewed: I have personally reviewed following labs and imaging studies  CBC: Recent Labs  Lab 07/19/2022 1833 07/19/22 0722 07/21/22 0533 07/22/22 1715 07/27/2022 0555  WBC 10.6* 8.0 8.1 7.6 9.9  NEUTROABS 7.8* 6.2  --   --   --   HGB 5.1* 8.8* 11.1* 9.5* 10.2*  HCT 16.2* 25.9* 33.6* 30.3* 31.2*  MCV 68.9* 76.4* 77.4* 80.6 79.0*  PLT 95* 67* 83* 79* 92*   Basic Metabolic Panel: Recent Labs  Lab 07/02/2022 1833 07/19/22 0722 07/21/22 0533 07/22/22 1715 07/19/2022 0555  NA 137 139 138 139 136  K 4.0 4.0 3.4* 3.1* 3.6  CL 104 111 109 112* 106  CO2 21* 20* 18* 15* 19*  GLUCOSE 73 68* 86 115* 146*  BUN '20 20 17 17 15  '$ CREATININE 1.11* 0.99 0.96 0.95 1.00  CALCIUM 8.5* 7.8* 7.7* 7.7* 7.6*  MG 1.9 1.8  --   --   --   PHOS  --  3.1  --   --   --    GFR: CrCl cannot be calculated (Unknown ideal weight.). Liver Function Tests: Recent Labs  Lab 07/29/2022 1833 07/19/22 0722 07/21/22 0533 07/22/22 1715 07/17/2022 0555  AST 67* 61* 50* 44* 54*  ALT '15 12 12 11 14  '$ ALKPHOS 96 80 87 84 88  BILITOT 0.8 0.5 0.2* QUANTITY NOT SUFFICIENT, UNABLE TO PERFORM TEST 0.4  PROT 6.9 6.2* 6.6 5.9* 6.1*  ALBUMIN 2.0* 1.8* 1.8* 1.6* 1.8*   No results for input(s): "LIPASE", "AMYLASE" in the last 168 hours. Recent Labs  Lab 07/19/22 0722  AMMONIA 30   Coagulation Profile: Recent Labs  Lab 07/13/2022 1845  INR 1.1   Cardiac Enzymes: No results for input(s): "CKTOTAL", "CKMB", "CKMBINDEX", "TROPONINI" in the last 168 hours. BNP (last 3 results) No results for input(s): "PROBNP" in the last 8760 hours. HbA1C: No results for input(s): "HGBA1C" in the last 72 hours. CBG: Recent Labs  Lab 07/22/22 1640 07/22/22 1656 07/22/22 2120 07/26/2022 0939  GLUCAP 54* 200* 89 144*   Lipid Profile: No results for input(s): "CHOL", "HDL", "LDLCALC", "TRIG", "CHOLHDL", "LDLDIRECT" in the last 72  hours. Thyroid Function Tests: No results for input(s): "TSH", "T4TOTAL", "FREET4", "T3FREE", "THYROIDAB" in the last 72 hours.  Anemia Panel: No results for input(s): "VITAMINB12", "FOLATE", "FERRITIN", "TIBC", "IRON", "RETICCTPCT" in the last 72 hours. Sepsis Labs: Recent Labs  Lab 07/21/2022 1845 07/03/2022 2025  LATICACIDVEN 2.7* 2.0*    Recent Results (from the past 240 hour(s))  Blood Culture (routine x 2)     Status: Abnormal   Collection Time: 07/09/2022  6:30 PM   Specimen: BLOOD  Result Value Ref Range Status   Specimen Description BLOOD SITE NOT SPECIFIED  Final   Special Requests   Final    BOTTLES DRAWN AEROBIC AND ANAEROBIC Blood Culture adequate volume   Culture  Setup Time   Final    GRAM POSITIVE COCCI IN BOTH AEROBIC AND ANAEROBIC BOTTLES CRITICAL RESULT CALLED TO, READ BACK BY AND VERIFIED WITH: Price [id number] AT [id number] BY EC    Culture (A)  Final    ENTEROCOCCUS CASSELIFLAVUS STAPHYLOCOCCUS SIMULANS THE  SIGNIFICANCE OF ISOLATING THIS ORGANISM FROM A SINGLE SET OF BLOOD CULTURES WHEN MULTIPLE SETS ARE DRAWN IS UNCERTAIN. PLEASE NOTIFY THE MICROBIOLOGY DEPARTMENT WITHIN ONE WEEK IF SPECIATION AND SENSITIVITIES ARE REQUIRED. Performed at [facility], [employer] [address] [place], [place] [address]    Report Status 07/22/2022 FINAL  Final   Organism ID, Bacteria ENTEROCOCCUS CASSELIFLAVUS  Final      Susceptibility   Enterococcus casseliflavus - MIC*    AMPICILLIN <=2 SENSITIVE Sensitive     VANCOMYCIN RESISTANT Resistant     GENTAMICIN SYNERGY SENSITIVE Sensitive     LINEZOLID 1 SENSITIVE Sensitive     * ENTEROCOCCUS CASSELIFLAVUS  Blood Culture (routine x 2)     Status: None   Collection Time: 07/26/2022  6:30 PM   Specimen: BLOOD  Result Value Ref Range Status   Specimen Description BLOOD SITE NOT SPECIFIED  Final   Special Requests   Final    BOTTLES DRAWN AEROBIC AND ANAEROBIC Blood Culture results may not be optimal due to an excessive volume of  blood received in culture bottles   Culture   Final    NO GROWTH 5 DAYS Performed at [facility], [place] [address] [place], [place] [address]    Report Status 07/15/2022 FINAL  Final  Blood Culture ID Panel (Reflexed)     Status: None   Collection Time: 07/02/2022  6:30 PM  Result Value Ref Range Status   Enterococcus faecalis NOT DETECTED NOT DETECTED Final   Enterococcus Faecium NOT DETECTED NOT DETECTED Final   Listeria monocytogenes NOT DETECTED NOT DETECTED Final   Staphylococcus species NOT DETECTED NOT DETECTED Final   Staphylococcus aureus (BCID) NOT DETECTED NOT DETECTED Final   Staphylococcus epidermidis NOT DETECTED NOT DETECTED Final   Staphylococcus lugdunensis NOT DETECTED NOT DETECTED Final   Streptococcus species NOT DETECTED NOT DETECTED Final   Streptococcus agalactiae NOT DETECTED NOT DETECTED Final   Streptococcus pneumoniae NOT DETECTED NOT DETECTED Final   Streptococcus pyogenes NOT DETECTED NOT DETECTED Final   A.calcoaceticus-baumannii NOT DETECTED NOT DETECTED Final   Bacteroides fragilis NOT DETECTED NOT DETECTED Final   Enterobacterales NOT DETECTED NOT DETECTED Final   Enterobacter cloacae complex NOT DETECTED NOT DETECTED Final   Escherichia coli NOT DETECTED NOT DETECTED Final   Klebsiella aerogenes NOT DETECTED NOT DETECTED Final   Klebsiella oxytoca NOT DETECTED NOT DETECTED Final   Klebsiella pneumoniae NOT DETECTED NOT DETECTED Final   Proteus species NOT DETECTED NOT DETECTED Final   Salmonella species NOT DETECTED NOT DETECTED Final   Serratia marcescens NOT DETECTED NOT DETECTED Final   Haemophilus influenzae NOT DETECTED NOT DETECTED Final   Neisseria meningitidis NOT DETECTED NOT DETECTED Final   Pseudomonas aeruginosa NOT DETECTED NOT DETECTED Final   Stenotrophomonas maltophilia NOT DETECTED NOT DETECTED Final   Candida albicans NOT DETECTED NOT DETECTED Final   Candida auris NOT DETECTED NOT DETECTED Final   Candida glabrata NOT  DETECTED NOT DETECTED Final   Candida krusei NOT DETECTED NOT DETECTED Final   Candida parapsilosis NOT DETECTED NOT DETECTED Final   Candida tropicalis NOT DETECTED NOT DETECTED Final   Cryptococcus neoformans/gattii NOT DETECTED NOT DETECTED Final    Comment: Performed at [facility], [address], [place], [place] [address]  Urine Culture     Status: Abnormal   Collection Time: 07/14/2022  6:33 PM   Specimen: Urine, Clean Catch  Result Value Ref Range Status   Specimen Description URINE, CLEAN CATCH  Final   Special Requests   Final    NONE Performed at [facility], [place] [address], [place], [place] [address]    Culture >=100,000 COLONIES/mL ESCHERICHIA COLI (A)  Final   Report Status 07/21/2022 FINAL  Final   Organism ID, Bacteria ESCHERICHIA COLI (A)  Final      Susceptibility   Escherichia coli - MIC*    AMPICILLIN 4 SENSITIVE Sensitive     CEFAZOLIN <=4 SENSITIVE Sensitive     CEFEPIME <=0.12 SENSITIVE Sensitive     CEFTRIAXONE <=0.25 SENSITIVE Sensitive     CIPROFLOXACIN <=0.25 SENSITIVE Sensitive     GENTAMICIN <=1 SENSITIVE Sensitive     IMIPENEM <=0.25 SENSITIVE Sensitive     NITROFURANTOIN <=16 SENSITIVE Sensitive     TRIMETH/SULFA <=20 SENSITIVE Sensitive     AMPICILLIN/SULBACTAM <=2 SENSITIVE Sensitive     PIP/TAZO <=4 SENSITIVE Sensitive     * >=100,000 COLONIES/mL ESCHERICHIA COLI  Culture, blood (Routine X 2) w Reflex to ID Panel     Status: None (Preliminary result)   Collection Time: 07/21/22 12:12 PM   Specimen: BLOOD  Result Value Ref Range Status   Specimen Description BLOOD LEFT ANTECUBITAL  Final   Special Requests   Final    BOTTLES DRAWN AEROBIC AND ANAEROBIC Blood Culture adequate volume   Culture   Final    NO GROWTH 2 DAYS Performed at [facility], [address] [place], [place] [address]    Report Status PENDING  Incomplete  Culture, blood (Routine X 2) w Reflex to ID Panel     Status: None (Preliminary result)    Collection Time: 07/21/22 12:14 PM   Specimen: BLOOD LEFT HAND  Result Value Ref Range Status   Specimen Description BLOOD LEFT HAND  Final   Special Requests   Final    BOTTLES DRAWN AEROBIC ONLY Blood Culture adequate volume   Culture   Final    NO GROWTH 2 DAYS Performed at [facility], [place] [address] [place], [place] [address]    Report Status PENDING  Incomplete         Radiology Studies: DG CHEST PORT 1 VIEW  Result Date: 07/22/2022 CLINICAL DATA:  Rapid heartbeat.  Aspiration pneumonia EXAM: PORTABLE CHEST 1 VIEW COMPARISON:  Chest 07/27/2022 FINDINGS: Progression of bibasilar airspace disease and small bilateral effusions. Progression of bilateral vascular congestion compatible with fluid overload. IMPRESSION: Progression of bibasilar airspace disease and small effusions. Probable fluid overload. Electronically Signed   By: Franchot Gallo M.D.   On: 07/22/2022 18:06    Scheduled Meds:  enoxaparin (LOVENOX) injection  30 mg Subcutaneous Daily   levothyroxine  100 mcg Oral Q0600   [START ON 08/14/2022] levothyroxine  25 mcg Oral Q0600   Continuous Infusions:     LOS: 5 days    Desma Maxim, MD [facility]  If 7PM-7AM, please contact night-coverage [email or web address]  07/19/2022, 9:46 AM

## 2022-07-23 NOTE — Progress Notes (Signed)
Patient expired at 2114. No audible or palpable pulses noted. No spontaneous respirations noted. Patient pronounced by Caroline Sauger. Jaelin Fackler RN and Science writer. Aileen Fass MD notified.

## 2022-07-24 DIAGNOSIS — E039 Hypothyroidism, unspecified: Secondary | ICD-10-CM | POA: Diagnosis not present

## 2022-07-24 LAB — PATHOLOGIST SMEAR REVIEW

## 2022-07-26 LAB — CULTURE, BLOOD (ROUTINE X 2)
Culture: NO GROWTH
Culture: NO GROWTH
Special Requests: ADEQUATE
Special Requests: ADEQUATE

## 2022-08-01 NOTE — Death Summary Note (Signed)
DEATH SUMMARY   Patient Details  Name: Vanessa Clayton MRN: [id number] DOB: 03/01/1926 [id number], Hunt Oris, MD Admission/Discharge Information   Admit Date:  07-21-22  Date of Death: Date of Death: 2022-07-26  Time of Death: Time of Death: 11/14/2232  Length of Stay: 6   Principle Cause of death: hypoxic respiratory failure  Hospital Diagnoses: Principal Problem:   Severe hypothyroidism Active Problems:   Hypercholesterolemia   Depression   Weight loss, non-intentional   Orthostatic hypotension   Essential hypertension   Anemia   Bilateral hearing loss   Hyperglycemia   Vitamin D deficiency   Hypertension, essential   Failure to thrive in adult   Pressure injury of skin   Stage 3a chronic kidney disease [facility])   Hospital Course:  Hx htn, hypothyroid, chf, ckd 3b, presenting with failure to thrive, unresponsiveness. Found to be profoundly hypothyroid, severely encephalopathic. Treated for myxedema coma with IV levothyroxine. Also treated for UTI with ceftriaxone. Also found to be profoundly anemic (hgb 5s) treated with red blood cell transfusion. No active bleeding and GI declined further evaluation given patient's critical illness. Encephalopathy worsened and patient unresponsive to touch, not responding to pain. Respiratory status worsened and required high flow nasal cannula. Developed metabolic acidosis. On 27-Jul-2023 discussed patient's very poor prognosis with family and shared decision made to transition to comfort care. Patient died later that day.          Procedures: none   Consultations: GI, palliative  The results of significant diagnostics from this hospitalization (including imaging, microbiology, ancillary and laboratory) are listed below for reference.   Significant Diagnostic Studies: DG CHEST PORT 1 VIEW  Result Date: 07/26/22 CLINICAL DATA:  Hypoxia EXAM: PORTABLE CHEST 1 VIEW COMPARISON:  07/22/2022 FINDINGS: The patient is rotated to the right on today's  radiograph, reducing diagnostic sensitivity and specificity. Atherosclerotic aortic arch. Tortuous thoracic aorta. Increased indistinct hazy opacity in the right chest with obscuration of the right lung base and hazy density in the right mid lung. This is nonspecific but a component of pleural effusion is certainly possible. Pneumonia or atelectasis not excluded. Continued retrocardiac airspace opacity with mild blunting of the left lateral costophrenic angle favoring pleural effusion left lower lobe airspace opacity. Left axillary clips noted. IMPRESSION: 1. Increased hazy opacity in the right chest with obscuration of the right lung base and hazy density in the right mid lung. This is nonspecific but a component of pleural effusion is certainly possible. Pneumonia or atelectasis not excluded. 2. Continued retrocardiac airspace opacity and suspected left pleural effusion. 3. Tortuous and atherosclerotic aortic arch. Aortic Atherosclerosis (ICD10-I70.0). Electronically Signed   By: Van Clines M.D.   On: July 26, 2022 11:17   DG CHEST PORT 1 VIEW  Result Date: 07/22/2022 CLINICAL DATA:  Rapid heartbeat.  Aspiration pneumonia EXAM: PORTABLE CHEST 1 VIEW COMPARISON:  Chest Jul 21, 2022 FINDINGS: Progression of bibasilar airspace disease and small bilateral effusions. Progression of bilateral vascular congestion compatible with fluid overload. IMPRESSION: Progression of bibasilar airspace disease and small effusions. Probable fluid overload. Electronically Signed   By: Franchot Gallo M.D.   On: 07/22/2022 18:06   CT Head Wo Contrast  Result Date: Jul 21, 2022 CLINICAL DATA:  Altered mental status EXAM: CT HEAD WITHOUT CONTRAST TECHNIQUE: Contiguous axial images were obtained from the base of the skull through the vertex without intravenous contrast. RADIATION DOSE REDUCTION: This exam was performed according to the departmental dose-optimization program which includes automated exposure control, adjustment of  the mA and/or kV according  to patient size and/or use of iterative reconstruction technique. COMPARISON:  10/26/2018 FINDINGS: Brain: Normal anatomic configuration. Parenchymal volume loss is commensurate with the patient's age but appears slightly progressive since prior examination. Moderate periventricular white matter changes are present likely reflecting the sequela of small vessel ischemia. Remote infarcts involving the left thalamus bifrontal periventricular white matter and medial left temporal cortex are again noted. No abnormal intra or extra-axial mass lesion or fluid collection. No abnormal mass effect or midline shift. No evidence of acute intracranial hemorrhage or infarct. Ventricular size is normal. Cerebellum unremarkable. Vascular: No asymmetric hyperdense vasculature at the skull base. Skull: Intact Sinuses/Orbits: Paranasal sinuses are clear. Orbits are unremarkable. Other: Mastoid air cells and middle ear cavities are clear. IMPRESSION: 1. No acute intracranial hemorrhage or infarct. 2. Moderate senescent change, slightly progressive since prior examination. 3. 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 None Electronically Signed   By: Fidela Salisbury M.D.   On: 07/11/2022 19:14   DG Chest Portable 1 View  Result Date: 07/17/2022 CLINICAL DATA:  Altered mental status EXAM: PORTABLE CHEST 1 VIEW COMPARISON:  01/25/2021 FINDINGS: No acute airspace disease or pleural effusion. Normal cardiac size. Probable pleural scarring at the right CP angle. Tortuous ectatic thoracic aortic knob likely augmented by patient rotation. Postsurgical changes of the left breast and axilla IMPRESSION: No active disease. Electronically Signed   By: Donavan Foil M.D.   On: 07/22/2022 18:58    Microbiology: Recent Results (from the past 240 hour(s))  Blood Culture (routine x 2)     Status: Abnormal   Collection Time: 07/26/2022   6:30 PM   Specimen: BLOOD  Result Value Ref Range Status   Specimen Description BLOOD SITE NOT SPECIFIED  Final   Special Requests   Final    BOTTLES DRAWN AEROBIC AND ANAEROBIC Blood Culture adequate volume   Culture  Setup Time   Final    GRAM POSITIVE COCCI IN BOTH AEROBIC AND ANAEROBIC BOTTLES CRITICAL RESULT CALLED TO, READ BACK BY AND VERIFIED WITH: [place] [id number] AT 3220 BY EC    Culture (A)  Final    ENTEROCOCCUS CASSELIFLAVUS STAPHYLOCOCCUS SIMULANS THE SIGNIFICANCE OF ISOLATING THIS ORGANISM FROM A SINGLE SET OF BLOOD CULTURES WHEN MULTIPLE SETS ARE DRAWN IS UNCERTAIN. PLEASE NOTIFY THE MICROBIOLOGY DEPARTMENT WITHIN ONE WEEK IF SPECIATION AND SENSITIVITIES ARE REQUIRED. Performed at [facility], [place] [address] [place], [place] [address]    Report Status 07/22/2022 FINAL  Final   Organism ID, Bacteria ENTEROCOCCUS CASSELIFLAVUS  Final      Susceptibility   Enterococcus casseliflavus - MIC*    AMPICILLIN <=2 SENSITIVE Sensitive     VANCOMYCIN RESISTANT Resistant     GENTAMICIN SYNERGY SENSITIVE Sensitive     LINEZOLID 1 SENSITIVE Sensitive     * ENTEROCOCCUS CASSELIFLAVUS  Blood Culture (routine x 2)     Status: None   Collection Time: 07/26/2022  6:30 PM   Specimen: BLOOD  Result Value Ref Range Status   Specimen Description BLOOD SITE NOT SPECIFIED  Final   Special Requests   Final    BOTTLES DRAWN AEROBIC AND ANAEROBIC Blood Culture results may not be optimal due to an excessive volume of blood received in culture bottles   Culture   Final    NO GROWTH 5 DAYS Performed at [facility], [place] [address], [place], [place] [address]    Report Status 07/28/2022 FINAL  Final  Blood Culture ID Panel (Reflexed)     Status: None   Collection Time: 07/06/2022  6:30 PM  Result Value Ref Range Status   Enterococcus faecalis NOT DETECTED NOT DETECTED Final   Enterococcus Faecium NOT DETECTED NOT DETECTED Final   Listeria monocytogenes NOT DETECTED NOT DETECTED  Final   Staphylococcus species NOT DETECTED NOT DETECTED Final   Staphylococcus aureus (BCID) NOT DETECTED NOT DETECTED Final   Staphylococcus epidermidis NOT DETECTED NOT DETECTED Final   Staphylococcus lugdunensis NOT DETECTED NOT DETECTED Final   Streptococcus species NOT DETECTED NOT DETECTED Final   Streptococcus agalactiae NOT DETECTED NOT DETECTED Final   Streptococcus pneumoniae NOT DETECTED NOT DETECTED Final   Streptococcus pyogenes NOT DETECTED NOT DETECTED Final   A.calcoaceticus-baumannii NOT DETECTED NOT DETECTED Final   Bacteroides fragilis NOT DETECTED NOT DETECTED Final   Enterobacterales NOT DETECTED NOT DETECTED Final   Enterobacter cloacae complex NOT DETECTED NOT DETECTED Final   Escherichia coli NOT DETECTED NOT DETECTED Final   Klebsiella aerogenes NOT DETECTED NOT DETECTED Final   Klebsiella oxytoca NOT DETECTED NOT DETECTED Final   Klebsiella pneumoniae NOT DETECTED NOT DETECTED Final   Proteus species NOT DETECTED NOT DETECTED Final   Salmonella species NOT DETECTED NOT DETECTED Final   Serratia marcescens NOT DETECTED NOT DETECTED Final   Haemophilus influenzae NOT DETECTED NOT DETECTED Final   Neisseria meningitidis NOT DETECTED NOT DETECTED Final   Pseudomonas aeruginosa NOT DETECTED NOT DETECTED Final   Stenotrophomonas maltophilia NOT DETECTED NOT DETECTED Final   Candida albicans NOT DETECTED NOT DETECTED Final   Candida auris NOT DETECTED NOT DETECTED Final   Candida glabrata NOT DETECTED NOT DETECTED Final   Candida krusei NOT DETECTED NOT DETECTED Final   Candida parapsilosis NOT DETECTED NOT DETECTED Final   Candida tropicalis NOT DETECTED NOT DETECTED Final   Cryptococcus neoformans/gattii NOT DETECTED NOT DETECTED Final    Comment: Performed at [facility], [address], [place], [place] [address]  Urine Culture     Status: Abnormal   Collection Time: 07/09/2022  6:33 PM   Specimen: Urine, Clean Catch  Result Value Ref Range Status    Specimen Description URINE, CLEAN CATCH  Final   Special Requests   Final    NONE Performed at [facility], [place] [address], [place], [place] [address]    Culture >=100,000 COLONIES/mL ESCHERICHIA COLI (A)  Final   Report Status 07/21/2022 FINAL  Final   Organism ID, Bacteria ESCHERICHIA COLI (A)  Final      Susceptibility   Escherichia coli - MIC*    AMPICILLIN 4 SENSITIVE Sensitive     CEFAZOLIN <=4 SENSITIVE Sensitive     CEFEPIME <=0.12 SENSITIVE Sensitive     CEFTRIAXONE <=0.25 SENSITIVE Sensitive     CIPROFLOXACIN <=0.25 SENSITIVE Sensitive     GENTAMICIN <=1 SENSITIVE Sensitive     IMIPENEM <=0.25 SENSITIVE Sensitive     NITROFURANTOIN <=16 SENSITIVE Sensitive     TRIMETH/SULFA <=20 SENSITIVE Sensitive     AMPICILLIN/SULBACTAM <=2 SENSITIVE Sensitive     PIP/TAZO <=4 SENSITIVE Sensitive     * >=100,000 COLONIES/mL ESCHERICHIA COLI  Culture, blood (Routine X 2) w Reflex to ID Panel     Status: None (Preliminary result)   Collection Time: 07/21/22 12:12 PM   Specimen: BLOOD  Result Value Ref Range Status   Specimen Description BLOOD LEFT ANTECUBITAL  Final   Special Requests   Final    BOTTLES DRAWN AEROBIC AND ANAEROBIC  Blood Culture adequate volume   Culture   Final    NO GROWTH 3 DAYS Performed at [facility], [place] [address], [place], [place] [address]    Report Status PENDING  Incomplete  Culture, blood (Routine X 2) w Reflex to ID Panel     Status: None (Preliminary result)   Collection Time: 07/21/22 12:14 PM   Specimen: BLOOD LEFT HAND  Result Value Ref Range Status   Specimen Description BLOOD LEFT HAND  Final   Special Requests   Final    BOTTLES DRAWN AEROBIC ONLY Blood Culture adequate volume   Culture   Final    NO GROWTH 3 DAYS Performed at [facility], [place] [address] [place], [place] [address]    Report Status PENDING  Incomplete    Time spent: 20 minutes  Signed: Desma Maxim, MD 08-09-22

## 2022-08-01 DEATH — deceased
